# Patient Record
Sex: Female | Born: 1955
Health system: Southern US, Community
[De-identification: ages and names within clinical notes are randomized; demographics above are authoritative.]

## PROBLEM LIST (undated history)

## (undated) DIAGNOSIS — N200 Calculus of kidney: Secondary | ICD-10-CM

## (undated) DIAGNOSIS — I10 Essential (primary) hypertension: Secondary | ICD-10-CM

## (undated) DIAGNOSIS — T7840XA Allergy, unspecified, initial encounter: Secondary | ICD-10-CM

## (undated) DIAGNOSIS — E785 Hyperlipidemia, unspecified: Secondary | ICD-10-CM

## (undated) DIAGNOSIS — N870 Mild cervical dysplasia: Secondary | ICD-10-CM

## (undated) DIAGNOSIS — N83209 Unspecified ovarian cyst, unspecified side: Secondary | ICD-10-CM

## (undated) HISTORY — DX: Mild cervical dysplasia: N87.0

## (undated) HISTORY — PX: PELVIC LAPAROSCOPY: SHX162

## (undated) HISTORY — DX: Unspecified ovarian cyst, unspecified side: N83.209

## (undated) HISTORY — DX: Essential (primary) hypertension: I10

## (undated) HISTORY — DX: Allergy, unspecified, initial encounter: T78.40XA

## (undated) HISTORY — PX: KNEE SURGERY: SHX244

## (undated) HISTORY — PX: JOINT REPLACEMENT: SHX530

## (undated) HISTORY — PX: COLPOSCOPY: SHX161

## (undated) HISTORY — PX: CERVICAL BIOPSY  W/ LOOP ELECTRODE EXCISION: SUR135

## (undated) HISTORY — DX: Hyperlipidemia, unspecified: E78.5

---

## 1990-02-11 HISTORY — PX: LEEP: SHX91

## 1994-02-11 HISTORY — PX: LAPAROSCOPY: SHX197

## 1997-10-26 ENCOUNTER — Other Ambulatory Visit: Admission: RE | Admit: 1997-10-26 | Discharge: 1997-10-26 | Payer: Self-pay | Admitting: Obstetrics and Gynecology

## 1998-11-29 ENCOUNTER — Other Ambulatory Visit: Admission: RE | Admit: 1998-11-29 | Discharge: 1998-11-29 | Payer: Self-pay | Admitting: Obstetrics and Gynecology

## 1999-12-01 ENCOUNTER — Emergency Department (HOSPITAL_COMMUNITY): Admission: EM | Admit: 1999-12-01 | Discharge: 1999-12-01 | Payer: Self-pay | Admitting: Emergency Medicine

## 1999-12-03 ENCOUNTER — Encounter: Payer: Self-pay | Admitting: Endocrinology

## 1999-12-03 ENCOUNTER — Observation Stay (HOSPITAL_COMMUNITY): Admission: EM | Admit: 1999-12-03 | Discharge: 1999-12-04 | Payer: Self-pay | Admitting: Emergency Medicine

## 2000-01-22 ENCOUNTER — Other Ambulatory Visit: Admission: RE | Admit: 2000-01-22 | Discharge: 2000-01-22 | Payer: Self-pay | Admitting: Obstetrics and Gynecology

## 2000-02-01 ENCOUNTER — Other Ambulatory Visit: Admission: RE | Admit: 2000-02-01 | Discharge: 2000-02-01 | Payer: Self-pay | Admitting: Obstetrics and Gynecology

## 2000-02-27 ENCOUNTER — Other Ambulatory Visit: Admission: RE | Admit: 2000-02-27 | Discharge: 2000-02-27 | Payer: Self-pay | Admitting: Obstetrics and Gynecology

## 2001-04-03 ENCOUNTER — Other Ambulatory Visit: Admission: RE | Admit: 2001-04-03 | Discharge: 2001-04-03 | Payer: Self-pay | Admitting: Obstetrics and Gynecology

## 2002-04-26 ENCOUNTER — Other Ambulatory Visit: Admission: RE | Admit: 2002-04-26 | Discharge: 2002-04-26 | Payer: Self-pay | Admitting: Obstetrics and Gynecology

## 2002-06-17 ENCOUNTER — Encounter: Payer: Self-pay | Admitting: Internal Medicine

## 2002-06-23 ENCOUNTER — Encounter: Payer: Self-pay | Admitting: Internal Medicine

## 2003-08-29 ENCOUNTER — Other Ambulatory Visit: Admission: RE | Admit: 2003-08-29 | Discharge: 2003-08-29 | Payer: Self-pay | Admitting: Obstetrics and Gynecology

## 2004-06-18 ENCOUNTER — Ambulatory Visit: Payer: Self-pay | Admitting: Internal Medicine

## 2004-09-04 ENCOUNTER — Other Ambulatory Visit: Admission: RE | Admit: 2004-09-04 | Discharge: 2004-09-04 | Payer: Self-pay | Admitting: Obstetrics and Gynecology

## 2005-12-31 ENCOUNTER — Other Ambulatory Visit: Admission: RE | Admit: 2005-12-31 | Discharge: 2005-12-31 | Payer: Self-pay | Admitting: Obstetrics and Gynecology

## 2006-01-17 ENCOUNTER — Encounter: Payer: Self-pay | Admitting: Internal Medicine

## 2007-03-03 ENCOUNTER — Encounter: Payer: Self-pay | Admitting: Internal Medicine

## 2007-06-10 ENCOUNTER — Encounter: Payer: Self-pay | Admitting: Internal Medicine

## 2007-06-29 ENCOUNTER — Encounter: Payer: Self-pay | Admitting: Gastroenterology

## 2007-06-29 ENCOUNTER — Encounter: Payer: Self-pay | Admitting: Internal Medicine

## 2007-06-29 ENCOUNTER — Other Ambulatory Visit: Admission: RE | Admit: 2007-06-29 | Discharge: 2007-06-29 | Payer: Self-pay | Admitting: Obstetrics and Gynecology

## 2007-07-02 ENCOUNTER — Encounter: Payer: Self-pay | Admitting: Gastroenterology

## 2007-07-23 ENCOUNTER — Ambulatory Visit: Payer: Self-pay | Admitting: Gastroenterology

## 2007-08-07 ENCOUNTER — Ambulatory Visit: Payer: Self-pay | Admitting: Gastroenterology

## 2007-08-07 LAB — HM COLONOSCOPY

## 2007-08-11 ENCOUNTER — Encounter: Payer: Self-pay | Admitting: Gastroenterology

## 2007-10-16 ENCOUNTER — Ambulatory Visit: Payer: Self-pay | Admitting: Obstetrics and Gynecology

## 2008-06-17 ENCOUNTER — Encounter: Payer: Self-pay | Admitting: Internal Medicine

## 2008-08-11 ENCOUNTER — Other Ambulatory Visit: Admission: RE | Admit: 2008-08-11 | Discharge: 2008-08-11 | Payer: Self-pay | Admitting: Obstetrics and Gynecology

## 2008-08-11 ENCOUNTER — Encounter: Payer: Self-pay | Admitting: Obstetrics and Gynecology

## 2008-08-11 ENCOUNTER — Ambulatory Visit: Payer: Self-pay | Admitting: Obstetrics and Gynecology

## 2009-06-11 LAB — HM MAMMOGRAPHY: HM Mammogram: NORMAL

## 2009-06-11 LAB — CONVERTED CEMR LAB: Pap Smear: NORMAL

## 2009-07-06 ENCOUNTER — Ambulatory Visit: Payer: Self-pay | Admitting: Internal Medicine

## 2009-07-12 LAB — CONVERTED CEMR LAB
ALT: 51 units/L — ABNORMAL HIGH (ref 0–35)
AST: 33 units/L (ref 0–37)
Albumin: 4.6 g/dL (ref 3.5–5.2)
Alkaline Phosphatase: 76 units/L (ref 39–117)
BUN: 14 mg/dL (ref 6–23)
Bilirubin, Direct: 0.2 mg/dL (ref 0.0–0.3)
CO2: 31 meq/L (ref 19–32)
Calcium: 9.8 mg/dL (ref 8.4–10.5)
Chloride: 101 meq/L (ref 96–112)
Cholesterol: 107 mg/dL (ref 0–200)
Creatinine, Ser: 0.6 mg/dL (ref 0.4–1.2)
GFR calc non Af Amer: 116.88 mL/min (ref >60)
Glucose, Bld: 80 mg/dL (ref 70–99)
HDL: 54.6 mg/dL (ref >39.00)
LDL Cholesterol: 35 mg/dL (ref 0–99)
Potassium: 3.6 meq/L (ref 3.5–5.1)
Sodium: 142 meq/L (ref 135–145)
TSH: 0.96 microintl units/mL (ref 0.35–5.50)
Total Bilirubin: 0.5 mg/dL (ref 0.3–1.2)
Total CHOL/HDL Ratio: 2
Total Protein: 7.1 g/dL (ref 6.0–8.3)
Triglycerides: 85 mg/dL (ref 0.0–149.0)
VLDL: 17 mg/dL (ref 0.0–40.0)

## 2009-08-04 ENCOUNTER — Ambulatory Visit: Payer: Self-pay | Admitting: Internal Medicine

## 2009-08-04 DIAGNOSIS — R21 Rash and other nonspecific skin eruption: Secondary | ICD-10-CM | POA: Insufficient documentation

## 2009-08-07 ENCOUNTER — Encounter: Payer: Self-pay | Admitting: Internal Medicine

## 2009-08-15 LAB — CONVERTED CEMR LAB
ALT: 86 units/L — ABNORMAL HIGH (ref 0–35)
AST: 46 units/L — ABNORMAL HIGH (ref 0–37)
Albumin: 4.6 g/dL (ref 3.5–5.2)
Alkaline Phosphatase: 87 units/L (ref 39–117)
Bilirubin, Direct: 0.1 mg/dL (ref 0.0–0.3)
Total Bilirubin: 0.6 mg/dL (ref 0.3–1.2)
Total Protein: 6.9 g/dL (ref 6.0–8.3)

## 2009-08-21 ENCOUNTER — Encounter: Payer: Self-pay | Admitting: Internal Medicine

## 2009-08-31 ENCOUNTER — Ambulatory Visit: Payer: Self-pay | Admitting: Obstetrics and Gynecology

## 2009-08-31 ENCOUNTER — Other Ambulatory Visit: Admission: RE | Admit: 2009-08-31 | Discharge: 2009-08-31 | Payer: Self-pay | Admitting: Obstetrics and Gynecology

## 2010-01-22 ENCOUNTER — Ambulatory Visit: Payer: Self-pay | Admitting: Internal Medicine

## 2010-01-22 ENCOUNTER — Encounter: Payer: Self-pay | Admitting: Internal Medicine

## 2010-01-22 LAB — CONVERTED CEMR LAB
ALT: 78 units/L — ABNORMAL HIGH (ref 0–35)
AST: 50 units/L — ABNORMAL HIGH (ref 0–37)
Albumin: 4.3 g/dL (ref 3.5–5.2)
Alkaline Phosphatase: 78 units/L (ref 39–117)
Bilirubin, Direct: 0.2 mg/dL (ref 0.0–0.3)
Cholesterol: 107 mg/dL (ref 0–200)
HCV Ab: NEGATIVE
HDL: 48.8 mg/dL (ref >39.00)
Hep B Core Total Ab: NEGATIVE
LDL Cholesterol: 43 mg/dL (ref 0–99)
Total Bilirubin: 0.7 mg/dL (ref 0.3–1.2)
Total CHOL/HDL Ratio: 2
Total Protein: 6.3 g/dL (ref 6.0–8.3)
Triglycerides: 75 mg/dL (ref 0.0–149.0)
VLDL: 15 mg/dL (ref 0.0–40.0)

## 2010-02-02 ENCOUNTER — Ambulatory Visit: Payer: Self-pay | Admitting: Internal Medicine

## 2010-03-15 NOTE — Procedures (Signed)
Summary: Colonoscopy and Endoscopy Direct Referral Form/[facility]  Colonoscopy and Endoscopy Direct Referral Form/ [facility]   Imported By: Stephannie Li 07/31/2007 09:22:03  _____________________________________________________________________  External Attachment:    Type:   Image     Comment:   External Document

## 2010-03-15 NOTE — Assessment & Plan Note (Signed)
Summary: 6 month rov/njhr   Vital Signs:  Patient profile:   55 year old female Menstrual status:  postmenopausal Weight:      183 pounds Temp:     98.5 degrees F oral Pulse rate:   72 / minute Pulse rhythm:   regular BP sitting:   122 / 82  (left arm) Cuff size:   large  Vitals Entered By: Alfred Levins, CMA (February 02, 2010 9:50 AM) CC: f/u   CC:  f/u.  History of Present Illness: patient comes in for followup. She has a history of hyperlipidemia and elevated LFTs. She has done a great job with weight loss, exercise. She states she is exercising nearly every day.  No other complaints.  Current Medications (verified): 1)  Aspirin 81 Mg  Tabs (Aspirin) .... Take 1 Tablet By Mouth Once A Day 2)  Hydrochlorothiazide 25 Mg  Tabs (Hydrochlorothiazide) .... Take 1 Tablet By Mouth Once A Day 3)  Metoprolol Tartrate 25 Mg Tabs (Metoprolol Tartrate) .... Take 1 Tablet By Mouth Two Times A Day 4)  Crestor 10 Mg Tabs (Rosuvastatin Calcium) .... Take 1 Tablet By Mouth At Bedtime 5)  Vitamin D (Ergocalciferol) 50000 Unit Caps (Ergocalciferol) .... Once Weekly 6)  Losartan Potassium 100 Mg Tabs (Losartan Potassium) .... Take 1 Tablet By Mouth Once A Day 7)  Centrum Cardio  Tabs (Multiple Vitamins-Minerals) .... Once Daily  Allergies (verified): 1)  ! Sulfa 2)  ! Lisinopril  Past History:  Past Medical History: Last updated: 07/06/2009 Hypertension Allergic rhinitis Hyperlipidemia  Past Surgical History: Last updated: 09/25/2006 Right knee arthroscopy Laparoscopy  Family History: Last updated: 02/02/2010 Family History Hypertension mother Family History of CAD Female---mother Family History Diabetes 1st degree relative--mother Siblings: none father deceased  Social History: Last updated: 07/28/2007 Occupation:  accounts receivable Divorced no children  Risk Factors: Smoking Status: quit (08/04/2009) Packs/Day: 0.5 (08/04/2009)  Family History: Family  History Hypertension mother Family History of CAD Female---mother Family History Diabetes 1st degree relative--mother Siblings: none father deceased  Physical Exam  General:  well-developed well-nourished female in no acute distress. HEENT exam atraumatic, normocephalic, neck supple. Cardiac exam S1-S2 are regular.   Impression & Recommendations:  Problem # 1:  HYPERLIPIDEMIA (ICD-272.4)  discussed will stop crestor and recheck in 3 months.  The following medications were removed from the medication list:    Crestor 10 Mg Tabs (Rosuvastatin calcium) .Marland Kitchen... Take 1 tablet by mouth at bedtime  Labs Reviewed: SGOT: 50 (01/22/2010)   SGPT: 78 (01/22/2010)   HDL:48.80 (01/22/2010), 54.60 (07/06/2009)  LDL:43 (01/22/2010), 35 (07/06/2009)  Chol:107 (01/22/2010), 107 (07/06/2009)  Trig:75.0 (01/22/2010), 85.0 (07/06/2009)  Problem # 2:  HYPERTENSION (ICD-401.9) will decrease medications. I think with continued weight loss and needle to discontinue some in the future. Her updated medication list for this problem includes:    Hydrochlorothiazide 25 Mg Tabs (Hydrochlorothiazide) .Marland Kitchen... Take 1 tablet by mouth once a day    Metoprolol Tartrate 25 Mg Tabs (Metoprolol tartrate) .Marland Kitchen... Take 1/2 tablet by mouth two times a day    Losartan Potassium 100 Mg Tabs (Losartan potassium) .Marland Kitchen... Take 1/2 tablet by mouth once a day  Complete Medication List: 1)  Aspirin 81 Mg Tabs (Aspirin) .... Take 1 tablet by mouth once a day 2)  Hydrochlorothiazide 25 Mg Tabs (Hydrochlorothiazide) .... Take 1 tablet by mouth once a day 3)  Metoprolol Tartrate 25 Mg Tabs (Metoprolol tartrate) .... Take 1/2 tablet by mouth two times a day 4)  Vitamin D (  ergocalciferol) 50000 Unit Caps (Ergocalciferol) .... Once weekly 5)  Losartan Potassium 100 Mg Tabs (Losartan potassium) .... Take 1/2 tablet by mouth once a day 6)  Centrum Cardio Tabs (Multiple vitamins-minerals) .... Once daily  Patient Instructions: 1)  Please  schedule a follow-up appointment in 3 months. 2)  lipids 272.4 3)  liver 995.2    Orders Added: 1)  Est. Patient Level III [[address]]

## 2010-03-15 NOTE — Assessment & Plan Note (Signed)
Summary: to be re-est/ok per doc/njr/pt rsc/cjr   Vital Signs:  Patient profile:   55 year old female Menstrual status:  postmenopausal Height:      64 inches Weight:      195 pounds BMI:     33.59 Pulse rate:   64 / minute Pulse rhythm:   regular Resp:     12 per minute BP sitting:   144 / 80  (left arm) Cuff size:   regular  Vitals Entered By: Gladis Riffle, RN (Jul 06, 2009 10:10 AM) CC: to re-establish Is Patient Diabetic? No     Menstrual Status postmenopausal Last PAP Result normal-pt's report   CC:  to re-establish.  History of Present Illness: not seenin 4-5 years---pt's report  HTN---unable to tolerate lisinopril---cough on metoprolol and amlodipine. On amlodipine she has noted progressive ankle swelling (late in day). No SOB, no pnd, orthopnea.  Lipids---tolerating meds, needs followup  Preventive Screening-Counseling & Management  Alcohol-Tobacco     Smoking Status: quit     Year Quit: 2008  Current Medications (verified): 1)  Aspirin 81 Mg  Tabs (Aspirin) .... Take 1 Tablet By Mouth Once A Day 2)  Hydrochlorothiazide 25 Mg  Tabs (Hydrochlorothiazide) .... Take 1 Tablet By Mouth Once A Day 3)  Amlodipine Besylate 10 Mg Tabs (Amlodipine Besylate) .... Take 1 Tablet By Mouth Once A Day 4)  Metoprolol Tartrate 25 Mg Tabs (Metoprolol Tartrate) .... Take 1 Tablet By Mouth Two Times A Day 5)  Crestor 10 Mg Tabs (Rosuvastatin Calcium) .... Take 1 Tablet By Mouth At Bedtime 6)  Vitamin D (Ergocalciferol) 50000 Unit Caps (Ergocalciferol) .... Once Weekly  Allergies: 1)  ! Sulfa 2)  ! Lisinopril  Past History:  Past Medical History: Hypertension Allergic rhinitis Hyperlipidemia  Social History: Smoking Status:  quit  Physical Exam  General:  alert and well-developed.   Head:  normocephalic and atraumatic.   Eyes:  pupils equal and pupils round.   Ears:  R ear normal and L ear normal.   Neck:  No deformities, masses, or tenderness noted. Lungs:   normal respiratory effort and no intercostal retractions.   Heart:  normal rate and regular rhythm.   Abdomen:  Bowel sounds positive,abdomen soft and non-tender without masses, organomegaly or hernias noted. Msk:  No deformity or scoliosis noted of thoracic or lumbar spine.   Neurologic:  cranial nerves II-XII intact and gait normal.   Skin:  turgor normal and color normal.     Impression & Recommendations:  Problem # 1:  HYPERTENSION (ICD-401.9)  edema with amlodipine---change meds side effects discussed BP needs better control anyway monitor BP at home The following medications were removed from the medication list:    Lisinopril 40 Mg Tabs (Lisinopril) .Marland Kitchen... Take 1 tablet by mouth once a day Her updated medication list for this problem includes:    Hydrochlorothiazide 25 Mg Tabs (Hydrochlorothiazide) .Marland Kitchen... Take 1 tablet by mouth once a day    Metoprolol Tartrate 25 Mg Tabs (Metoprolol tartrate) .Marland Kitchen... Take 1 tablet by mouth two times a day    Losartan Potassium 100 Mg Tabs (Losartan potassium) .Marland Kitchen... Take 1 tablet by mouth once a day  BP today: 144/80  Orders: TLB-BMP (Basic Metabolic Panel-BMET) (80048-METABOL)  Problem # 2:  HYPERLIPIDEMIA (ICD-272.4) check labs today Her updated medication list for this problem includes:    Crestor 10 Mg Tabs (Rosuvastatin calcium) .Marland Kitchen... Take 1 tablet by mouth at bedtime  Orders: Venipuncture ([address]) TLB-BMP (Basic Metabolic Panel-BMET) (80048-METABOL) TLB-Lipid  Panel (80061-LIPID) TLB-Hepatic/Liver Function Pnl (80076-HEPATIC) TLB-TSH (Thyroid Stimulating Hormone) (84443-TSH)  Problem # 3:  Preventive Health Care (ICD-V70.0) tdap today reviewed colon she is UTD on PAP, Mammo  Complete Medication List: 1)  Aspirin 81 Mg Tabs (Aspirin) .... Take 1 tablet by mouth once a day 2)  Hydrochlorothiazide 25 Mg Tabs (Hydrochlorothiazide) .... Take 1 tablet by mouth once a day 3)  Metoprolol Tartrate 25 Mg Tabs (Metoprolol tartrate) ....  Take 1 tablet by mouth two times a day 4)  Crestor 10 Mg Tabs (Rosuvastatin calcium) .... Take 1 tablet by mouth at bedtime 5)  Vitamin D (ergocalciferol) 50000 Unit Caps (Ergocalciferol) .... Once weekly 6)  Losartan Potassium 100 Mg Tabs (Losartan potassium) .... Take 1 tablet by mouth once a day  Preventive Care Screening  Colonoscopy:    Next Due:  08/2017  Mammogram:    Date:  06/11/2009    Next Due:  06/2011    Results:  normal-pt's report   Pap Smear:    Date:  06/11/2009    Next Due:  06/2012    Results:  normal-pt's report    Patient Instructions: 1)  Please schedule a follow-up appointment in 1 month. Prescriptions: LOSARTAN POTASSIUM 100 MG TABS (LOSARTAN POTASSIUM) Take 1 tablet by mouth once a day  #30 x 11   Entered and Authorized by:   Birdie Sons MD   Signed by:   Birdie Sons MD on 07/06/2009   Method used:   [facility]       [address]       [place], [place]  [address]       Ph: [phone]       Fax: [phone]   RxID:   [id number]   Appended Document: to be re-est/ok per doc/njr/pt rsc/cjr   Immunizations Administered:  Tetanus Vaccine:    Vaccine Type: Tdap    Site: left deltoid    Mfr: GlaxoSmithKline    Dose: 0.5 ml    Route: IM    Given by: Gladis Riffle, RN    Exp. Date: 05/06/2011    Lot #: [id number]

## 2010-03-15 NOTE — Consult Note (Signed)
Summary: Dr Yolanda Bonine note  Dr Yolanda Bonine note   Imported By: Kassie Mends 07/30/2007 09:06:46  _____________________________________________________________________  External Attachment:    Type:   Image     Comment:   Dr Yolanda Bonine note

## 2010-03-15 NOTE — Consult Note (Signed)
Summary: [facility]  [facility]   Imported By: Maryln Gottron 05/08/2007 15:39:15  _____________________________________________________________________  External Attachment:    Type:   Image     Comment:   External Document

## 2010-03-15 NOTE — Procedures (Signed)
Summary: Colonoscopy   Colonoscopy  Procedure date:  08/07/2007  Findings:      Location:  [facility].   08/2017  Colonoscopy Patient Name: Sheryl, Bates MRN:  Procedure Procedures: Colonoscopy CPT: 47829.    with Hot Biopsy(s)CPT: Z451292.  Personnel: Endoscopist: Barbette Hair. Arlyce Dice, MD.  Exam Location: Outpatient  Patient Consent: Procedure, Alternatives, Risks and Benefits discussed, consent obtained, from patient.  Indications  Evaluation of: Positive fecal occult blood test  History  Current Medications: Patient is not currently taking Coumadin.  Pre-Exam Physical: Performed Aug 07, 2007. Cardio-pulmonary exam, HEENT exam , Abdominal exam, Mental status exam WNL.  Comments: Patient history reviewed/updated, physical performed prior to initiation of sedation?yes Exam Exam: Extent of exam reached: Cecum, extent intended: Cecum.  The cecum was identified by appendiceal orifice and IC valve. Time to Cecum: 00:03: 07. Time for Withdrawl: 00:09:05. Colon retroflexion performed. ASA Classification: I. Tolerance: good.  Monitoring: Pulse and BP monitoring, Oximetry used. Supplemental O2 given. at 2 Liters.  Colon Prep Used Miralax for colon prep. Prep results: good.  Sedation Meds: Patient assessed and found to be appropriate for moderate (conscious) sedation. Sedation was managed by the Endoscopist. Fentanyl 50 mcg. given IV. Versed 5 mg. given IV.  Findings - DIVERTICULOSIS: Ascending Colon to Transverse Colon. ICD9: Diverticulosis: 562.10. Comments: Few diverticula.  POLYP: Descending Colon, Maximum size: 3 mm. Procedure:  hot biopsy, ICD9: Colon Polyps: 211.3. Comments: Nonbleeding polyp.  - DIVERTICULOSIS: Descending Colon to Sigmoid Colon. Comments: Scattered diverticula.  NORMAL EXAM: Cecum.  - NORMAL EXAM: Sigmoid Colon to Rectum.   Assessment Abnormal examination, see findings above.  Diagnoses: 562.10: Diverticulosis.  211.3: Colon  Polyps.   Events  Unplanned Interventions: No intervention was required.  Unplanned Events: There were no complications. Plans  Post Exam Instructions: Post sedation instructions given.  Patient Education: Patient given standard instructions for: Polyps. Diverticulosis.  Disposition: After procedure patient sent to recovery. After recovery patient sent home.  Scheduling/Referral: Home stool hemocults, around Aug 14, 2007.  Colonoscopy, to Barbette Hair. Arlyce Dice, MD, if adenoma, around Aug 06, 2012.    cc.   Daniel L. Gottsegen,MD     REPORT OF SURGICAL PATHOLOGY   Case #: [id number] Patient Name: Sheryl Bates, Sheryl Bates. Office Chart Number:  [id number]   MRN: [id number] Pathologist: Alden Server A. Delila Spence, MD DOB/Age  [birth year] (Age: 55)    Gender: F Date Taken:  08/04/2007 Date Received: 08/07/2007   FINAL DIAGNOSIS   ***MICROSCOPIC EXAMINATION AND DIAGNOSIS***   COLON, DESCENDING, POLYPS:   - INFLAMED HYPERPLASTIC POLYP. - NO ADENOMATOUS CHANGE, HIGH GRADE DYSPLASIA, OR MALIGNANCY IDENTIFIED.    gdt Date Reported:  08/10/2007     Alden Server A. Delila Spence, MD *** Electronically Signed Out By EAA ***   Clinical information Heme + stool R/O adenoma (jes)    specimen(s) obtained Colon, polyp(s), descending   Gross Description Received in formalin is a tan, soft tissue fragment that is submitted in toto.  Size:  0.4 cm One block  (ML:jes,08/10/07)    jes/     Signed by Louis Meckel MD on 08/11/2007 at 2:59 PM  ________________________________________________________________________ colo 10 yrs   Signed by Louis Meckel MD on 08/11/2007 at 2:59 PM    August 11, 2007 MRN: [id number]    [name] [address] [place], [place]  [address]    Dear [name],  I am pleased to inform you that the colon polyp(s) removed during your recent colonoscopy was (were) found to  be benign (no cancer detected) upon pathologic examination.  I recommend you have a repeat  colonoscopy examination in 10_ years to look for recurrent polyps, as having colon polyps increases your risk for having recurrent polyps or even colon cancer in the future.  Should you develop new or worsening symptoms of abdominal pain, bowel habit changes or bleeding from the rectum or bowels, please schedule an evaluation with either your primary care physician or with me.  Additional information/recommendations:  __ No further action with gastroenterology is needed at this time. Please      follow-up with your primary care physician for your other healthcare      needs.  __ Please call [phone] to schedule a return visit to review your      situation.  __ Please keep your follow-up visit as already scheduled.  _x_ Continue treatment plan as outlined the day of your exam.  Please call us if you are having persistent problems or have questions about your condition that have not been fully answered at this time.  Sincerely,  Louis Meckel MD  This letter has been electronically signed by your physician.   Signed by Louis Meckel MD on 08/11/2007 at 3:00 PM  ________________________________________________________________________   ________________________________________________________________________

## 2010-03-15 NOTE — Letter (Signed)
Summary: [facility]  [facility]   Imported By: Maryln Gottron 07/14/2008 08:36:18  _____________________________________________________________________  External Attachment:    Type:   Image     Comment:   External Document

## 2010-03-15 NOTE — Miscellaneous (Signed)
Summary: RX MEDS  Clinical Lists Changes  Medications: Added new medication of DULCOLAX 5 MG  TBEC (BISACODYL) Day before procedure take 2 at 3pm and 2 at 8pm. - Signed Added new medication of METOCLOPRAMIDE HCL 10 MG  TABS (METOCLOPRAMIDE HCL) As per prep instructions. - Signed Added new medication of MIRALAX   POWD (POLYETHYLENE GLYCOL 3350) As per prep  instructions. - Signed Rx of DULCOLAX 5 MG  TBEC (BISACODYL) Day before procedure take 2 at 3pm and 2 at 8pm.;  #4 x 0;  Signed;  Entered by: Burman Foster RN;  Authorized by: Louis Meckel MD;  Method used: Electronic Rx of METOCLOPRAMIDE HCL 10 MG  TABS (METOCLOPRAMIDE HCL) As per prep instructions.;  #2 x 0;  Signed;  Entered by: Burman Foster RN;  Authorized by: Louis Meckel MD;  Method used: Electronic Rx of MIRALAX   POWD (POLYETHYLENE GLYCOL 3350) As per prep  instructions.;  #255gm x 0;  Signed;  Entered by: Burman Foster RN;  Authorized by: Louis Meckel MD;  Method used: Electronic    Prescriptions: MIRALAX   POWD (POLYETHYLENE GLYCOL 3350) As per prep  instructions.  #255gm x 0   Entered by:   Burman Foster RN   Authorized by:   Louis Meckel MD   Signed by:   Burman Foster RN on 07/23/2007   Method used:   Electronically sent to ...       [facility]       [address]       [place], [place]  [address]       Ph: [phone]       Fax: [phone]   RxID:   [id number] METOCLOPRAMIDE HCL 10 MG  TABS (METOCLOPRAMIDE HCL) As per prep instructions.  #2 x 0   Entered by:   Burman Foster RN   Authorized by:   Louis Meckel MD   Signed by:   Burman Foster RN on 07/23/2007   Method used:   Electronically sent to ...       [facility]       [address]       [place], [place]  [address]       Ph: [phone]       Fax: [phone]   RxID:   [id number] DULCOLAX 5 MG  TBEC (BISACODYL) Day before procedure take 2 at  3pm and 2 at 8pm.  #4 x 0   Entered by:   Burman Foster RN   Authorized by:   Louis Meckel MD   Signed by:   Burman Foster RN on 07/23/2007   Method used:   Electronically sent to ...       [facility]       [address]       [place], [place]  [address]       Ph: [phone]       Fax: [phone]   RxID:   [id number]

## 2010-03-15 NOTE — Assessment & Plan Note (Signed)
Summary: 1 MONTH ROV/NJR--check LFTs 995.2/et   Vital Signs:  Patient profile:   55 year old female Menstrual status:  postmenopausal Weight:      196 pounds BMI:     33.76 Temp:     98.6 degrees F oral Pulse rate:   60 / minute Pulse rhythm:   regular Resp:     12 per minute BP sitting:   142 / 80  (left arm) Cuff size:   regular  Vitals Entered By: Gladis Riffle, RN (August 04, 2009 11:49 AM) CC: 1 month rov, to have LFTs done today--c/o poison ivy behind ears, up into scalp, and all across neck Is Patient Diabetic? No   CC:  1 month rov, to have LFTs done today--c/o poison ivy behind ears, up into scalp, and and all across neck.  History of Present Illness: rash for 7 days worked in yard previously pruritic  here for f/u elevated LFTs no jaundice no abdominal pain  hyperlipidemia note crestor  All other systems reviewed and were negative   Preventive Screening-Counseling & Management  Alcohol-Tobacco     Smoking Status: quit     Packs/Day: 0.5     Year Quit: 2008  Current Medications (verified): 1)  Aspirin 81 Mg  Tabs (Aspirin) .... Take 1 Tablet By Mouth Once A Day 2)  Hydrochlorothiazide 25 Mg  Tabs (Hydrochlorothiazide) .... Take 1 Tablet By Mouth Once A Day 3)  Metoprolol Tartrate 25 Mg Tabs (Metoprolol Tartrate) .... Take 1 Tablet By Mouth Two Times A Day 4)  Crestor 10 Mg Tabs (Rosuvastatin Calcium) .... Take 1 Tablet By Mouth At Bedtime 5)  Vitamin D (Ergocalciferol) 50000 Unit Caps (Ergocalciferol) .... Once Weekly 6)  Losartan Potassium 100 Mg Tabs (Losartan Potassium) .... Take 1 Tablet By Mouth Once A Day 7)  Centrum Cardio  Tabs (Multiple Vitamins-Minerals) .... Once Daily  Allergies: 1)  ! Sulfa 2)  ! Lisinopril  Social History: Packs/Day:  0.5  Physical Exam  General:  alert and well-developed.   Head:  Normocephalic and atraumatic without obvious abnormalities. No apparent alopecia or balding. Ears:  R ear normal and L ear normal.   Neck:   No deformities, masses, or tenderness noted. Heart:  normal rate and regular rhythm.   Skin:  raised rash on arms, neck and trunk Cervical Nodes:  No lymphadenopathy noted Axillary Nodes:  No palpable lymphadenopathy Inguinal Nodes:  No significant adenopathy   Impression & Recommendations:  Problem # 1:  RASH-NONVESICULAR (ICD-782.1)  contact dermatitis steroind injection   Orders: Depo- Medrol 80mg  (J1040) Admin of Therapeutic Inj (IM or Frost) ([address]) Admin of Therapeutic Inj  intramuscular or subcutaneous ([address])  Problem # 2:  HYPERTENSION (ICD-401.9) edema resolved off amlodipine bp ok Her updated medication list for this problem includes:    Hydrochlorothiazide 25 Mg Tabs (Hydrochlorothiazide) .Marland Kitchen... Take 1 tablet by mouth once a day    Metoprolol Tartrate 25 Mg Tabs (Metoprolol tartrate) .Marland Kitchen... Take 1 tablet by mouth two times a day    Losartan Potassium 100 Mg Tabs (Losartan potassium) .Marland Kitchen... Take 1 tablet by mouth once a day  BP today: 142/80 Prior BP: 144/80 (07/06/2009)  Labs Reviewed: K+: 3.6 (07/06/2009) Creat: : 0.6 (07/06/2009)   Chol: 107 (07/06/2009)   HDL: 54.60 (07/06/2009)   LDL: 35 (07/06/2009)   TG: 85.0 (07/06/2009)  Problem # 3:  HYPERLIPIDEMIA (ICD-272.4)  Her updated medication list for this problem includes:    Crestor 10 Mg Tabs (Rosuvastatin calcium) .Marland Kitchen... Take 1  tablet by mouth at bedtime  Labs Reviewed: SGOT: 33 (07/06/2009)   SGPT: 51 (07/06/2009)   HDL:54.60 (07/06/2009)  LDL:35 (07/06/2009)  Chol:107 (07/06/2009)  Trig:85.0 (07/06/2009) carotid ultrasound report from [facility]  Complete Medication List: 1)  Aspirin 81 Mg Tabs (Aspirin) .... Take 1 tablet by mouth once a day 2)  Hydrochlorothiazide 25 Mg Tabs (Hydrochlorothiazide) .... Take 1 tablet by mouth once a day 3)  Metoprolol Tartrate 25 Mg Tabs (Metoprolol tartrate) .... Take 1 tablet by mouth two times a day 4)  Crestor 10 Mg Tabs (Rosuvastatin calcium) .... Take 1 tablet by  mouth at bedtime 5)  Vitamin D (ergocalciferol) 50000 Unit Caps (Ergocalciferol) .... Once weekly 6)  Losartan Potassium 100 Mg Tabs (Losartan potassium) .... Take 1 tablet by mouth once a day 7)  Centrum Cardio Tabs (Multiple vitamins-minerals) .... Once daily  Other Orders: Venipuncture (57846) TLB-Hepatic/Liver Function Pnl (80076-HEPATIC)  Patient Instructions: 1)  Please schedule a follow-up appointment in 6 months. 2)  lipids 272.4 3)  liver 995.2.    Medication Administration  Injection # 1:    Medication: Depo- Medrol 80mg     Diagnosis: RASH-NONVESICULAR (ICD-782.1)    Route: IM    Site: LUOQ gluteus    Exp Date: 12/13/2011    Lot #: [id number]    Mfr: pfizer    Patient tolerated injection without complications    Given by: Gladis Riffle, RN (August 04, 2009 12:25 PM)  Orders Added: 1)  Est. Patient Level IV [28413] 2)  Depo- Medrol 80mg  [J1040] 3)  Admin of Therapeutic Inj (IM or Kaka) [96372] 4)  Admin of Therapeutic Inj  intramuscular or subcutaneous [96372] 5)  Venipuncture [36415] 6)  TLB-Hepatic/Liver Function Pnl [80076-HEPATIC]

## 2010-03-15 NOTE — Letter (Signed)
**Note De-Identified Sanora Cunanan Obfuscation** Summary: Patient Notice- Polyp Results  [facility]  [address] [place], [place] [address]   Phone: [phone]  Fax: [phone]        August 11, 2007 MRN: [id number]    Naysa Adcox [address] [place], [place]  [address]    Dear Ms. Betzler,  I am pleased to inform you that the colon polyp(s) removed during your recent colonoscopy was (were) found to be benign (no cancer detected) upon pathologic examination.  I recommend you have a repeat colonoscopy examination in 10_ years to look for recurrent polyps, as having colon polyps increases your risk for having recurrent polyps or even colon cancer in the future.  Should you develop new or worsening symptoms of abdominal pain, bowel habit changes or bleeding from the rectum or bowels, please schedule an evaluation with either your primary care physician or with me.  Additional information/recommendations:  __ No further action with gastroenterology is needed at this time. Please      follow-up with your primary care physician for your other healthcare      needs.  __ Please call [phone] to schedule a return visit to review your      situation.  __ Please keep your follow-up visit as already scheduled.  _x_ Continue treatment plan as outlined the day of your exam.  Please call us if you are having persistent problems or have questions about your condition that have not been fully answered at this time.  Sincerely,  Louis Meckel MD  This letter has been electronically signed by your physician.

## 2010-03-15 NOTE — Consult Note (Signed)
Summary: [facility]   Imported By: Stephannie Li 07/31/2007 09:23:50  _____________________________________________________________________  External Attachment:    Type:   Image     Comment:   External Document

## 2010-04-20 ENCOUNTER — Other Ambulatory Visit: Payer: Self-pay

## 2010-04-23 ENCOUNTER — Other Ambulatory Visit (INDEPENDENT_AMBULATORY_CARE_PROVIDER_SITE_OTHER): Payer: Self-pay

## 2010-04-23 DIAGNOSIS — T887XXA Unspecified adverse effect of drug or medicament, initial encounter: Secondary | ICD-10-CM

## 2010-04-23 DIAGNOSIS — E785 Hyperlipidemia, unspecified: Secondary | ICD-10-CM

## 2010-04-23 LAB — LIPID PANEL
Cholesterol: 169 mg/dL (ref 0–200)
HDL: 53.2 mg/dL (ref >39.00)
LDL Cholesterol: 95 mg/dL (ref 0–99)
Total CHOL/HDL Ratio: 3
Triglycerides: 102 mg/dL (ref 0.0–149.0)
VLDL: 20.4 mg/dL (ref 0.0–40.0)

## 2010-04-23 LAB — HEPATIC FUNCTION PANEL
ALT: 53 U/L — ABNORMAL HIGH (ref 0–35)
AST: 39 U/L — ABNORMAL HIGH (ref 0–37)
Albumin: 4.2 g/dL (ref 3.5–5.2)
Alkaline Phosphatase: 71 U/L (ref 39–117)
Bilirubin, Direct: 0.2 mg/dL (ref 0.0–0.3)
Total Bilirubin: 0.7 mg/dL (ref 0.3–1.2)
Total Protein: 6.3 g/dL (ref 6.0–8.3)

## 2010-05-01 ENCOUNTER — Encounter: Payer: Self-pay | Admitting: Internal Medicine

## 2010-05-04 ENCOUNTER — Ambulatory Visit (INDEPENDENT_AMBULATORY_CARE_PROVIDER_SITE_OTHER): Payer: BC Managed Care – PPO | Admitting: Internal Medicine

## 2010-05-04 ENCOUNTER — Encounter: Payer: Self-pay | Admitting: Internal Medicine

## 2010-05-04 DIAGNOSIS — I10 Essential (primary) hypertension: Secondary | ICD-10-CM

## 2010-05-04 DIAGNOSIS — E785 Hyperlipidemia, unspecified: Secondary | ICD-10-CM

## 2010-05-04 MED ORDER — LOSARTAN POTASSIUM 50 MG PO TABS: 50.0000 mg | ORAL_TABLET | Freq: Every day | ORAL | Status: DC

## 2010-05-04 NOTE — Progress Notes (Signed)
  Subjective:    Patient ID: Sheryl Bates, female    DOB: 1955/10/04, 55 y.o.   MRN: [id number]  HPI  F/u lfts F/u lipids  BP---back on 50 mg metoprolol and currently taking losartan 50 mg   Past Medical History  Diagnosis Date  . Hypertension   . Allergy   . Hyperlipidemia    Past Surgical History  Procedure Date  . Knee arthroscopy     right  . Laparoscopy     reports that she quit smoking about 5 years ago. She does not have any smokeless tobacco history on file. She reports that she drinks alcohol. She reports that she does not use illicit drugs. family history includes Diabetes in her mother; Heart disease in her mother; and Hypertension in her mother. Allergies  Allergen Reactions  . Lisinopril     REACTION: cough  . Sulfonamide Derivatives      Review of Systems  patient denies chest pain, shortness of breath, orthopnea. Denies lower extremity edema, abdominal pain, change in appetite, change in bowel movements. Patient denies rashes, musculoskeletal complaints. No other specific complaints in a complete review of systems.      Objective:   Physical Exam nad Pulses normal CV-reg rate no gallop Chest- no increased WOB ABD-soft, NT Ext, no edema       Assessment & Plan:  No need for statin Continue bp meds lfts improved---weight loss

## 2010-05-07 ENCOUNTER — Encounter: Payer: Self-pay | Admitting: Internal Medicine

## 2010-05-07 NOTE — Assessment & Plan Note (Signed)
Adequate control. Continue current medications.  Reviewed medication list with patient. Printed on after visit summary  

## 2010-05-07 NOTE — Assessment & Plan Note (Signed)
Adequate control. Continue current medications.  Reviewed medication list with patient. Printed on after visit summary

## 2010-05-25 ENCOUNTER — Other Ambulatory Visit: Payer: Self-pay | Admitting: *Deleted

## 2010-05-25 DIAGNOSIS — I10 Essential (primary) hypertension: Secondary | ICD-10-CM

## 2010-05-25 MED ORDER — HYDROCHLOROTHIAZIDE 25 MG PO TABS: 25.0000 mg | ORAL_TABLET | Freq: Every day | ORAL | Status: DC

## 2010-06-29 NOTE — Discharge Summary (Signed)
[place]  Patient:    Rolly Salter                  MRN: [id number] Adm. Date:  [id number] Disc. Date: [id number] Attending:  Justine Null Dictator:   Cornell Barman, P.A.                           Discharge Summary  DISCHARGE DIAGNOSES: 1. Intractable nausea and vomiting. 2. Gastritis. 3. Hypertension.  BRIEF ADMISSION HISTORY:  [name] is a 55 year old white female who presented with three days of intractable nausea and vomiting.  She denied any diarrhea.  She has been vomiting vile, yellow fluid.  She had one episode of fever and chills on Saturday, prompting an emergency department evaluation at [facility].  Her work-up there was unremarkable.  She was discharged home with antiemetics and suppositories.  Unfortunately, she did not fill the prescription.  She presented on the day of admission with continued nausea, vomiting, abdominal pain, and anorexia.  LABORATORIES ON ADMISSION:  The amylase was 46 and the lipase was 27.  The CBC and CMET were normal.  The urinalysis was negative.  The urine pregnancy test was negative.  HOSPITAL COURSE: #1 - GASTROINTESTINAL:  The patient was admitted with intractable nausea and vomiting most likely gastritis.  An abdominal ultrasound was obtained and was normal with no evidence of gallstones.  After IV fluids and antiemetics, the patient is now able to tolerate clear liquids and feels she is ready for discharge home.  #2 - HYPERTENSION:  The patients blood pressure was poorly controlled.  On admission, her blood pressure was 206/112.  The patient had systolic blood pressures that ranged from 157-199.  The patient is not on any antihypertensives, but we will start her on some Norvasc at discharge.  MEDICATIONS AT DISCHARGE: 1. Norvasc 5 mg q.d. 2. Phenergan 25 mg suppository q.4-6h. as needed.  FOLLOW-UP:  The patient is to follow up with Bruce H. Swords, M.D., in  the next one to two weeks for a blood pressure check. DD:  12/04/99 TD:  12/04/99 Job: [id number]

## 2010-06-29 NOTE — H&P (Signed)
[place]. [facility]  Patient:    Sheryl Bates                  MRN: [id number] Adm. Date:  [id number] Attending:  Sela Hua Dictator:   Cornell Barman, P.A. CC:         Valetta Mole. Swords, M.D. [facility]   History and Physical  HISTORY OF PRESENT ILLNESS:  Ms. Sheryl Bates is a 55 year old white female who presents with intractable nausea and vomiting since Saturday.  The patient was evaluated in the [facility] emergency department on Saturday evening.  The patient had had a temperature of 101 with chills.  Her workup at [facility] was unremarkable except for a white count of 10.9 and 87% neutrophils.  Amylase and lipase were normal.  The patient was discharged home with a prescription for an antiemetic which she did not fill.  The patient returns today again with intractable nausea and vomiting.  She describes 60 episodes of emesis since Saturday evening.  This is bile and yellow-colored in nature.  The patient denies any more fevers or chills since Saturday.  Her last bowel movement was Saturday and it was a small amount.  The patient has been essentially NPO since Saturday as well. The patient denies any diarrhea or loose stools.  She denies any dysuria. She denies any exposure to illness or travel.  She states that she ate some chicken nuggets at [employer] on Friday around 2:30 p.m. and developed nausea that evening.  The patient in the emergency department is feeling better after receiving Compazine and IV fluids.  PAST MEDICAL HISTORY:  Negative except for arthroscopic right knee surgery and laparoscopy for endometriosis.  She denies hypertension, diabetes, thyroid disease, asthma, emphysema, peptic ulcer disease, reflux.  SOCIAL HISTORY:  She is divorced and has no children.  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  She does smoke 1/2 to 1 pack per day for about 25 years.  She occasionally drinks  alcohol.  FAMILY HISTORY:  Mother living at 80 with diabetes.  Father living at age 56. This patient is an only child.  REVIEW OF SYSTEMS:  Negative for weight change.  She denies any dyspnea, chest pain, dysuria, melena, bright red blood per rectum, coffeeground emesis, hematemesis, history of urinary tract infection, nephrolithiasis, gallstones.  PHYSICAL EXAMINATION:  VITAL SIGNS:  The patient was afebrile.  Blood pressure 206/112, repeat blood pressure 143/55, respirations 18, heart rate 60.  GENERAL:  This is an obese, middle-aged white female who is groggy from Compazine.  HEENT:  Atraumatic and normocephalic.  Pupils are equally round and reactive to light and accommodation.  Oropharynx is clear.  Mucous membranes are moist.  NECK:  Without carotid bruit, adenopathy, or thyromegaly.  LUNGS:  Clear without wheezes, rales, or rhonchi.  HEART:  Regular without murmurs, rubs, or gallops.  ABDOMEN:  Bowel sounds present, soft and nontender.  NO hepatosplenomegaly. No rebound and no guarding.  No CVA tenderness.  EXTREMITIES:  Without edema.  NEUROLOGICAL:  Without any focal deficits.  LABORATORY DATA:  Amylase is 46, lipase 27.  CMET, CBC with differential were normal.  UA was negative.  Urine pregnancy was negative.  IMPRESSION: 1. Gastrointestinal intractable nausea and vomiting, possibly viral gastritis    with an unremarkable examination. 2. Hypertension, currently controlled, no previous history of hypertension.  PLAN:  We discussed a 24-hour admission with the patient for IV fluids and antiemetics.  At  this time, she prefers a prescription for an antiemetic suppository and being able to return home.  This can be done and we can arrange for the patient to follow up with Dr. Cato Mulligan later this week.  The patients blood pressure probably should be monitored a little more closely. DD:  12/03/99 TD:  12/03/99 Job: [id number]

## 2010-09-04 ENCOUNTER — Encounter: Payer: Self-pay | Admitting: Internal Medicine

## 2010-09-19 ENCOUNTER — Encounter: Payer: BC Managed Care – PPO | Admitting: Obstetrics and Gynecology

## 2010-10-11 ENCOUNTER — Encounter: Payer: Self-pay | Admitting: Obstetrics and Gynecology

## 2010-10-11 ENCOUNTER — Ambulatory Visit (INDEPENDENT_AMBULATORY_CARE_PROVIDER_SITE_OTHER): Payer: BC Managed Care – PPO | Admitting: Obstetrics and Gynecology

## 2010-10-11 ENCOUNTER — Other Ambulatory Visit (HOSPITAL_COMMUNITY)
Admission: RE | Admit: 2010-10-11 | Discharge: 2010-10-11 | Disposition: A | Discharge status: Home / Self Care | Payer: BC Managed Care – PPO | Source: Ambulatory Visit | Attending: Obstetrics and Gynecology | Admitting: Obstetrics and Gynecology

## 2010-10-11 VITALS — BP 134/84 | Ht 64.0 in | Wt 192.0 lb

## 2010-10-11 DIAGNOSIS — E559 Vitamin D deficiency, unspecified: Secondary | ICD-10-CM

## 2010-10-11 DIAGNOSIS — R82998 Other abnormal findings in urine: Secondary | ICD-10-CM

## 2010-10-11 DIAGNOSIS — Z01419 Encounter for gynecological examination (general) (routine) without abnormal findings: Secondary | ICD-10-CM | POA: Insufficient documentation

## 2010-10-11 NOTE — Progress Notes (Signed)
The patient came to see me today for an annual GYN exam. She is doing well. She whenever 50,000 IUs D. And is now taking 1200 a day. She is up-to-date on mammograms and bone densities. She has occasional menopausal symptoms but is fine without HRT. She is having no pelvic pain or vaginal bleeding.  Physical examination: HEENT within normal limits. Neck: Thyroid not large. No masses. Supraclavicular nodes: not enlarged. Breasts: Examined in both sitting midline position. No skin changes and no masses. Abdomen: Soft no guarding rebound or masses or hernia. Pelvic: External: Within normal limits. BUS: Within normal limits. Vaginal:within normal limits. Good estrogen effect. No evidence of cystocele rectocele or enterocele. Cervix: clean. Uterus: Normal size and shape. Adnexa: No masses. Rectovaginal exam: Confirmatory and negative. Extremities: Within normal limits.  Assessment: 1. Mild menopausal symptoms 2. Vitamin D deficiency  Plan: Vitamin D level checked. We will notify her if she needs to change her vitamin D. Continue yearly mammograms.

## 2010-10-12 ENCOUNTER — Telehealth: Payer: Self-pay

## 2010-10-12 LAB — VITAMIN D 25 HYDROXY (VIT D DEFICIENCY, FRACTURES): Vit D, 25-Hydroxy: 31 ng/mL (ref 30–89)

## 2010-10-16 NOTE — Telephone Encounter (Signed)
PT. NOTIFIED BY CELL # VOICEMAIL, PER DR. G. VIT. D LEVEL IN LOW NORMAL RANGE AT 31, & TO INCREASE OTC VIT. D TO 2,000 IU'S Q.D.

## 2010-10-26 ENCOUNTER — Telehealth: Payer: Self-pay | Admitting: Internal Medicine

## 2010-10-26 ENCOUNTER — Other Ambulatory Visit: Payer: BC Managed Care – PPO

## 2010-10-26 NOTE — Telephone Encounter (Signed)
Just found out that her mother just passed, Sheryl Bates. Patient is coming in from a [place] trip and will need something to calm her nerves. Please call [facility]. Thanks. Her cell number is [phone]. Thanks.

## 2010-10-29 MED ORDER — ALPRAZOLAM 0.5 MG PO TABS: 0.5000 mg | ORAL_TABLET | Freq: Three times a day (TID) | ORAL | Status: AC | PRN

## 2010-10-29 NOTE — Telephone Encounter (Signed)
rx called in, pt aware 

## 2010-10-29 NOTE — Telephone Encounter (Signed)
Alprazolam 0.5 mg po bid prn anxiety #20/0 refills

## 2010-11-02 ENCOUNTER — Encounter: Payer: BC Managed Care – PPO | Admitting: Internal Medicine

## 2010-11-23 ENCOUNTER — Other Ambulatory Visit: Payer: BC Managed Care – PPO

## 2010-11-30 ENCOUNTER — Encounter: Payer: Self-pay | Admitting: Internal Medicine

## 2010-11-30 ENCOUNTER — Ambulatory Visit (INDEPENDENT_AMBULATORY_CARE_PROVIDER_SITE_OTHER): Payer: BC Managed Care – PPO | Admitting: Internal Medicine

## 2010-11-30 VITALS — BP 136/84 | HR 68 | Temp 98.4°F | Ht 63.5 in | Wt 191.0 lb

## 2010-11-30 DIAGNOSIS — I10 Essential (primary) hypertension: Secondary | ICD-10-CM

## 2010-11-30 DIAGNOSIS — Z0000 Encounter for general adult medical examination without abnormal findings: Secondary | ICD-10-CM

## 2010-11-30 DIAGNOSIS — E785 Hyperlipidemia, unspecified: Secondary | ICD-10-CM

## 2010-11-30 DIAGNOSIS — R079 Chest pain, unspecified: Secondary | ICD-10-CM

## 2010-11-30 LAB — HEPATIC FUNCTION PANEL
ALT: 93 U/L — ABNORMAL HIGH (ref 0–35)
AST: 45 U/L — ABNORMAL HIGH (ref 0–37)
Albumin: 4.6 g/dL (ref 3.5–5.2)
Alkaline Phosphatase: 64 U/L (ref 39–117)
Bilirubin, Direct: 0.1 mg/dL (ref 0.0–0.3)
Total Bilirubin: 0.5 mg/dL (ref 0.3–1.2)
Total Protein: 7.1 g/dL (ref 6.0–8.3)

## 2010-11-30 LAB — BASIC METABOLIC PANEL WITH GFR
BUN: 15 mg/dL (ref 6–23)
CO2: 29 meq/L (ref 19–32)
Calcium: 9.4 mg/dL (ref 8.4–10.5)
Chloride: 103 meq/L (ref 96–112)
Creatinine, Ser: 0.7 mg/dL (ref 0.4–1.2)
GFR: 98.19 mL/min
Glucose, Bld: 87 mg/dL (ref 70–99)
Potassium: 3.6 meq/L (ref 3.5–5.1)
Sodium: 143 meq/L (ref 135–145)

## 2010-11-30 LAB — CBC WITH DIFFERENTIAL/PLATELET
Basophils Absolute: 0 10*3/uL (ref 0.0–0.1)
Basophils Relative: 0.5 % (ref 0.0–3.0)
Eosinophils Absolute: 0 10*3/uL (ref 0.0–0.7)
Eosinophils Relative: 0.7 % (ref 0.0–5.0)
HCT: 44.8 % (ref 36.0–46.0)
Hemoglobin: 14.9 g/dL (ref 12.0–15.0)
Lymphocytes Relative: 34.7 % (ref 12.0–46.0)
Lymphs Abs: 1.9 10*3/uL (ref 0.7–4.0)
MCHC: 33.2 g/dL (ref 30.0–36.0)
MCV: 94.9 fl (ref 78.0–100.0)
Monocytes Absolute: 0.4 10*3/uL (ref 0.1–1.0)
Monocytes Relative: 6.9 % (ref 3.0–12.0)
Neutro Abs: 3.2 10*3/uL (ref 1.4–7.7)
Neutrophils Relative %: 57.2 % (ref 43.0–77.0)
Platelets: 204 10*3/uL (ref 150.0–400.0)
RBC: 4.72 Mil/uL (ref 3.87–5.11)
RDW: 12.9 % (ref 11.5–14.6)
WBC: 5.5 10*3/uL (ref 4.5–10.5)

## 2010-11-30 LAB — POCT URINALYSIS DIPSTICK
Bilirubin, UA: NEGATIVE
Blood, UA: NEGATIVE
Glucose, UA: NEGATIVE
Ketones, UA: NEGATIVE
Nitrite, UA: NEGATIVE
Spec Grav, UA: 1.025
Urobilinogen, UA: 0.2
pH, UA: 6.5

## 2010-11-30 LAB — TSH: TSH: 0.85 u[IU]/mL (ref 0.35–5.50)

## 2010-11-30 LAB — LIPID PANEL
Cholesterol: 195 mg/dL (ref 0–200)
HDL: 64.6 mg/dL (ref >39.00)
LDL Cholesterol: 108 mg/dL — ABNORMAL HIGH (ref 0–99)
Total CHOL/HDL Ratio: 3
Triglycerides: 114 mg/dL (ref 0.0–149.0)
VLDL: 22.8 mg/dL (ref 0.0–40.0)

## 2010-11-30 NOTE — Progress Notes (Signed)
  Subjective:    Patient ID: Sheryl Bates, female    DOB: May 16, 1955, 55 y.o.   MRN: [id number]  HPI  cpx  Last week had an episode of left sided chest pain---has had nonexertional episodes since that time.   Past Medical History  Diagnosis Date  . Hypertension   . Allergy   . Hyperlipidemia   . Endometriosis   . Ovarian cyst     right  . CIN I (cervical intraepithelial neoplasia I)   . Vitamin D deficiency    Past Surgical History  Procedure Date  . Knee surgery     Right  . Laparoscopy 1996  . Leep 1992  . Cervical biopsy  w/ loop electrode excision   . Pelvic laparoscopy     reports that she quit smoking about 5 years ago. She has never used smokeless tobacco. She reports that she drinks alcohol. She reports that she does not use illicit drugs. family history includes Diabetes in her mother; Heart disease in her father and mother; and Hypertension in her mother. mother deceased with MI Allergies  Allergen Reactions  . Lisinopril     REACTION: cough  . Sulfonamide Derivatives    Review of Systems  patient denies chest pain, shortness of breath, orthopnea. Denies lower extremity edema, abdominal pain, change in appetite, change in bowel movements. Patient denies rashes, musculoskeletal complaints. No other specific complaints in a complete review of systems.      Objective:   Physical Exam  Well-developed well-nourished female in no acute distress. HEENT exam atraumatic, normocephalic, extraocular muscles are intact. Neck is supple. No jugular venous distention no thyromegaly. Chest clear to auscultation without increased work of breathing. Cardiac exam S1 and S2 are regular. Abdominal exam active bowel sounds, soft, nontender. Extremities no edema. Neurologic exam she is alert without any motor sensory deficits. Gait is normal.     Assessment & Plan:  cpx---well visit  Episode of CP---EKG and stress test (will be scheduled)

## 2010-11-30 NOTE — Assessment & Plan Note (Signed)
BP Readings from Last 3 Encounters:  11/30/10 136/84  10/11/10 134/84  05/04/10 132/90   Well controlled Continue meds

## 2010-11-30 NOTE — Assessment & Plan Note (Signed)
Needs f/u labs Check today 

## 2010-12-03 ENCOUNTER — Other Ambulatory Visit: Payer: Self-pay | Admitting: Internal Medicine

## 2010-12-03 DIAGNOSIS — R634 Abnormal weight loss: Secondary | ICD-10-CM

## 2010-12-05 ENCOUNTER — Other Ambulatory Visit: Payer: Self-pay | Admitting: Internal Medicine

## 2010-12-10 ENCOUNTER — Other Ambulatory Visit: Payer: Self-pay | Admitting: Internal Medicine

## 2010-12-11 ENCOUNTER — Telehealth: Payer: Self-pay | Admitting: Cardiology

## 2010-12-11 NOTE — Telephone Encounter (Signed)
I spoke with the Sheryl Bates and scheduled her to see Dr Riley Kill on 12/21/10.

## 2010-12-11 NOTE — Telephone Encounter (Signed)
Pt calling regarding referral from Dr. Birdie Sons office. Pt was scheduled appt to see Dr. Jens Som tomorrow at 2:30p but pt wanted to know if she could see Dr. Riley Kill. Pt was made aware that Dr. Riley Kill is no longer accepting new pts. Pt continued to say that Dr. Riley Kill was pt mothers cardiologists  [facility] ([birth year]), and would really prefer for Dr. Riley Kill to be pt cardiologists. Pt mother passed on 2024/09/21and there was confusion on who was to sign the death certificate--long story short Dr. Riley Kill called pt. Pt spoke with Dr. Riley Kill and asked if he would take her on as a pt and he said he would. Please return pt call to discuss further.

## 2010-12-12 ENCOUNTER — Institutional Professional Consult (permissible substitution): Payer: BC Managed Care – PPO | Admitting: Cardiology

## 2010-12-21 ENCOUNTER — Encounter: Payer: Self-pay | Admitting: Cardiology

## 2010-12-21 ENCOUNTER — Ambulatory Visit (INDEPENDENT_AMBULATORY_CARE_PROVIDER_SITE_OTHER): Payer: BC Managed Care – PPO | Admitting: Cardiology

## 2010-12-21 DIAGNOSIS — E785 Hyperlipidemia, unspecified: Secondary | ICD-10-CM

## 2010-12-21 DIAGNOSIS — I10 Essential (primary) hypertension: Secondary | ICD-10-CM

## 2010-12-21 DIAGNOSIS — R079 Chest pain, unspecified: Secondary | ICD-10-CM

## 2010-12-21 NOTE — Patient Instructions (Signed)
Your physician has requested that you have an exercise tolerance test. For further information please visit [email or web address]. Please also follow instruction sheet, as given.  Your physician recommends that you continue on your current medications as directed. Please refer to the Current Medication list given to you today.  

## 2010-12-23 NOTE — Progress Notes (Signed)
HPI:  This very nice lady is well known to me primarily through her mother.  Her mother died recently, the cause of which was unclear.  She had underlying CAD.  She is an only child, and has no children..  She has had rare chest pain.  One day before her mother died, she had a brief episode in the left upper chest.  It lasted one to two minutes.  Of note, they had been traveling, but had been getting out of the car and doing things, not going too far in any one day.  It scared her, but she was not short of breath, and had not diaphoresis.  Once along time ago, she had the same thing,  Overall, she has several risk factors, and wanted to get this checked out a bit.  Denies progressive symptoms.  She is going to weight watchers, and her husband is now willing, and he has stopped smoking.   She previously got some activity, up to three times per week, but has not been doing this as she has been stressed by her mother's death, and there have been some family dynamics.  She is able to go to weight watchers using her company flex card, so wants to do it if we can give her a note that it is warranted.  I certainly think it is.    Current Outpatient Prescriptions  Medication Sig Dispense Refill  . ALPRAZolam (XANAX) 0.5 MG tablet Take 1 tablet (0.5 mg total) by mouth 3 (three) times daily as needed for sleep or anxiety.  20 tablet  0  . aspirin 81 MG tablet Take 81 mg by mouth daily.        . beta carotene w/minerals (OCUVITE) tablet Take 1 tablet by mouth daily.        . Calcium Carbonate-Vitamin D (CALCIUM + D PO) Take by mouth 2 (two) times daily.        . hydrochlorothiazide (HYDRODIURIL) 25 MG tablet TAKE ONE TABLET BY MOUTH ONE TIME DAILY  30 tablet  4  . losartan (COZAAR) 50 MG tablet Take 1 tablet (50 mg total) by mouth daily.  90 tablet  3  . metoprolol tartrate (LOPRESSOR) 25 MG tablet Take 25 mg by mouth 2 (two) times daily.        . Multiple Vitamin (MULTIVITAMIN) tablet Take 2 tablets by mouth daily.        . Omega-3 Fatty Acids (FISH OIL) 1200 MG CAPS Take by mouth 2 (two) times daily.          Allergies  Allergen Reactions  . Lisinopril     REACTION: cough  . Sulfonamide Derivatives     Past Medical History  Diagnosis Date  . Hypertension   . Allergy   . Hyperlipidemia   . Endometriosis   . Ovarian cyst     right  . CIN I (cervical intraepithelial neoplasia I)   . Vitamin D deficiency     Past Surgical History  Procedure Date  . Knee surgery     Right  . Laparoscopy 1996  . Leep 1992  . Cervical biopsy  w/ loop electrode excision   . Pelvic laparoscopy     Family History  Problem Relation Age of Onset  . Hypertension Mother   . Heart disease Mother   . Diabetes Mother   . Heart disease Father     History   Social History  . Marital Status: Married    Spouse Name: N/A  Number of Children: N/A  . Years of Education: N/A   Occupational History  . Not on file.   Social History Main Topics  . Smoking status: Former Smoker    Quit date: 05/03/2005  . Smokeless tobacco: Never Used  . Alcohol Use: Yes     rare  . Drug Use: No  . Sexually Active: Yes    Birth Control/ Protection: Post-menopausal   Other Topics Concern  . Not on file   Social History Narrative  . No narrative on file    ROS: Please see the HPI.  All other systems reviewed and negative.  No swelling in the legs, erythema.  No prolonged shortness of breath.   PHYSICAL EXAM:  BP 132/70  Pulse 78  Ht 5\' 3"  (1.6 m)  Wt 86.637 kg (191 lb)  BMI 33.83 kg/m2  General:  Modestly overweight, well appearing female , in no acute distress. Head:  Normocephalic and atraumatic. Neck: no JVD Lungs: Clear to auscultation and percussion. Heart: Normal S1 and S2.  No murmur, rubs or gallops.  Abdomen:  Normal bowel sounds; soft; non tender; no organomegaly Pulses: Pulses normal in all 4 extremities. Extremities: No clubbing or cyanosis. No edema. Neurologic: Alert and oriented x  3.  EKG:  NSR.  Incomplete RBBB.  No acute changes.   ASSESSMENT AND PLAN:

## 2010-12-26 ENCOUNTER — Telehealth: Payer: Self-pay | Admitting: Internal Medicine

## 2010-12-26 NOTE — Telephone Encounter (Signed)
Her mom recently passed away and would like an anti-depressant. Would like a call back from a nurse.

## 2010-12-27 NOTE — Telephone Encounter (Signed)
I have never seen this pt before.  Perhaps Dr. Cato Mulligan will prescribe something for and have her follow up in the office.

## 2010-12-28 MED ORDER — CITALOPRAM HYDROBROMIDE 10 MG PO TABS: 10.0000 mg | ORAL_TABLET | Freq: Every day | ORAL | Status: DC

## 2010-12-28 NOTE — Telephone Encounter (Signed)
Citalopram 10 mg po qd, #30/3 refill

## 2010-12-28 NOTE — Telephone Encounter (Signed)
rx sent in electronically, pt aware 

## 2010-12-30 DIAGNOSIS — R079 Chest pain, unspecified: Secondary | ICD-10-CM | POA: Insufficient documentation

## 2010-12-30 NOTE — Assessment & Plan Note (Signed)
Patient has pos family history, and she has some risk factors, and symptoms, and wants to exercise.  Will recommend a POET to assess her exercise tolerance and exclude ischemia as the cause.  She has no findings to raise suspicion for PE.

## 2010-12-30 NOTE — Assessment & Plan Note (Signed)
Appears nicely controlled by Dr. Cato Mulligan.

## 2010-12-30 NOTE — Assessment & Plan Note (Signed)
Some elevation of LFTs at present, and she is not way over recommended guidelines.  Would not treat at this point.

## 2011-01-29 ENCOUNTER — Encounter: Payer: BC Managed Care – PPO | Admitting: Cardiology

## 2011-03-15 ENCOUNTER — Ambulatory Visit (INDEPENDENT_AMBULATORY_CARE_PROVIDER_SITE_OTHER): Payer: BC Managed Care – PPO | Admitting: Cardiology

## 2011-03-15 DIAGNOSIS — E785 Hyperlipidemia, unspecified: Secondary | ICD-10-CM

## 2011-03-15 DIAGNOSIS — I10 Essential (primary) hypertension: Secondary | ICD-10-CM

## 2011-03-15 NOTE — Progress Notes (Signed)
Exercise Treadmill Test  Pre-Exercise Testing Evaluation Rhythm: normal sinus  Rate: 70   PR:  18 QRS:  .08  QT:  43 QTc: 47     Test  Exercise Tolerance Test Ordering MD: Shawnie Pons, MD  Interpreting MD:  Shawnie Pons, MD  Unique Test No: 1  Treadmill:  1  Indication for ETT: chest pain - rule out ischemia  Contraindication to ETT: No   Stress Modality: exercise - treadmill  Cardiac Imaging Performed: non   Protocol: standard Bruce - maximal  Max BP:  201/69  Max MPHR (bpm):  163 85% MPR (bpm):  138  MPHR obtained (bpm):  144 % MPHR obtained:  88%  Reached 85% MPHR (min:sec):  7:13 Total Exercise Time (min-sec):  8:00  Workload in METS:  10.1 Borg Scale: 17  Reason ETT Terminated:  fatigue    ST Segment Analysis At Rest: normal ST segments - no evidence of significant ST depression With Exercise: no evidence of significant ST depression  Other Information Arrhythmia:  No.  Rare PVC Angina during ETT:  absent (0) Quality of ETT:  diagnostic  ETT Interpretation:  normal - no evidence of ischemia by ST analysis  Comments: Moderately good exercise tolerance with no evidence of exercise induced ST depression or chest pain.  There was a moderately prominent hypertensive response to exercise.      Recommendations: Encourage healthy lifestyle.  Follow up with Dr. Cato Mulligan.  Consideration of adjustment of BP meds and reevaluate after BP is brought down.  Follow up of LFTs also suggested.

## 2011-04-19 ENCOUNTER — Other Ambulatory Visit: Payer: Self-pay | Admitting: Internal Medicine

## 2011-04-22 ENCOUNTER — Other Ambulatory Visit: Payer: Self-pay | Admitting: Internal Medicine

## 2011-05-19 ENCOUNTER — Other Ambulatory Visit: Payer: Self-pay | Admitting: Internal Medicine

## 2011-05-20 ENCOUNTER — Other Ambulatory Visit: Payer: Self-pay | Admitting: *Deleted

## 2011-08-06 ENCOUNTER — Other Ambulatory Visit: Payer: Self-pay | Admitting: Internal Medicine

## 2011-09-30 ENCOUNTER — Other Ambulatory Visit: Payer: Self-pay | Admitting: Internal Medicine

## 2011-10-05 ENCOUNTER — Other Ambulatory Visit: Payer: Self-pay | Admitting: Internal Medicine

## 2011-11-06 ENCOUNTER — Other Ambulatory Visit: Payer: Self-pay | Admitting: Internal Medicine

## 2011-11-29 ENCOUNTER — Other Ambulatory Visit: Payer: Self-pay | Admitting: Internal Medicine

## 2011-12-06 ENCOUNTER — Other Ambulatory Visit: Payer: Self-pay | Admitting: Internal Medicine

## 2011-12-19 ENCOUNTER — Encounter: Payer: Self-pay | Admitting: Obstetrics and Gynecology

## 2011-12-31 ENCOUNTER — Ambulatory Visit (INDEPENDENT_AMBULATORY_CARE_PROVIDER_SITE_OTHER): Payer: BC Managed Care – PPO | Admitting: Obstetrics and Gynecology

## 2011-12-31 ENCOUNTER — Encounter: Payer: Self-pay | Admitting: Obstetrics and Gynecology

## 2011-12-31 ENCOUNTER — Other Ambulatory Visit (HOSPITAL_COMMUNITY)
Admission: RE | Admit: 2011-12-31 | Discharge: 2011-12-31 | Disposition: A | Discharge status: Home / Self Care | Payer: BC Managed Care – PPO | Source: Ambulatory Visit | Attending: Obstetrics and Gynecology | Admitting: Obstetrics and Gynecology

## 2011-12-31 VITALS — BP 140/86 | Ht 64.0 in | Wt 202.0 lb

## 2011-12-31 DIAGNOSIS — N870 Mild cervical dysplasia: Secondary | ICD-10-CM | POA: Insufficient documentation

## 2011-12-31 DIAGNOSIS — N83209 Unspecified ovarian cyst, unspecified side: Secondary | ICD-10-CM | POA: Insufficient documentation

## 2011-12-31 DIAGNOSIS — E559 Vitamin D deficiency, unspecified: Secondary | ICD-10-CM | POA: Insufficient documentation

## 2011-12-31 DIAGNOSIS — E785 Hyperlipidemia, unspecified: Secondary | ICD-10-CM | POA: Insufficient documentation

## 2011-12-31 DIAGNOSIS — Z01419 Encounter for gynecological examination (general) (routine) without abnormal findings: Secondary | ICD-10-CM | POA: Insufficient documentation

## 2011-12-31 DIAGNOSIS — N809 Endometriosis, unspecified: Secondary | ICD-10-CM | POA: Insufficient documentation

## 2011-12-31 DIAGNOSIS — I10 Essential (primary) hypertension: Secondary | ICD-10-CM | POA: Insufficient documentation

## 2011-12-31 NOTE — Patient Instructions (Signed)
We will call you with vitamin d level.

## 2011-12-31 NOTE — Progress Notes (Signed)
Patient came to see me today for her annual GYN exam. She is now had 3 normal bone densities. Her last bone density was 2010. She is up-to-date on mammograms. She has a past history of vitamin D deficiency. For a while she did 50,000 IUs of vitamin D weekly/every other week. We switched her last year to 1000 IUs daily. She does lab with her PCP. She is having no vaginal bleeding. She is having no pelvic pain. She had a LEEP for cervical dysplasia in 1992. She has had normal Pap smears since that. Her last Pap was 2012.  Physical examination:Sheryl Bates present. HEENT within normal limits. Neck: Thyroid not large. No masses. Supraclavicular nodes: not enlarged. Breasts: Examined in both sitting and lying  position. No skin changes and no masses. Abdomen: Soft no guarding rebound or masses or hernia. Pelvic: External: Within normal limits. BUS: Within normal limits. Vaginal:within normal limits. Good estrogen effect. No evidence of cystocele rectocele or enterocele. Cervix: clean. Uterus: Normal size and shape. Adnexa: No masses. Rectovaginal exam: Confirmatory and negative. Extremities: Within normal limits.  Assessment: #1. Cervical dysplasia #2. Vitamin D deficiency  Plan: Continue yearly mammograms. Vitamin D level checked. Pap done at patient request.The new Pap smear guidelines were discussed with the patient.

## 2012-01-01 LAB — URINALYSIS W MICROSCOPIC + REFLEX CULTURE
Bacteria, UA: NONE SEEN
Bilirubin Urine: NEGATIVE
Casts: NONE SEEN
Crystals: NONE SEEN
Glucose, UA: NEGATIVE mg/dL
Hgb urine dipstick: NEGATIVE
Ketones, ur: NEGATIVE mg/dL
Leukocytes, UA: NEGATIVE
Nitrite: NEGATIVE
Protein, ur: NEGATIVE mg/dL
Specific Gravity, Urine: 1.015 (ref 1.005–1.030)
Urobilinogen, UA: 0.2 mg/dL (ref 0.0–1.0)
pH: 7.5 (ref 5.0–8.0)

## 2012-01-01 LAB — VITAMIN D 25 HYDROXY (VIT D DEFICIENCY, FRACTURES): Vit D, 25-Hydroxy: 30 ng/mL (ref 30–89)

## 2012-01-10 ENCOUNTER — Encounter: Payer: Self-pay | Admitting: Obstetrics and Gynecology

## 2012-01-10 ENCOUNTER — Other Ambulatory Visit: Payer: Self-pay | Admitting: Internal Medicine

## 2012-07-09 ENCOUNTER — Other Ambulatory Visit: Payer: Self-pay | Admitting: Internal Medicine

## 2013-04-16 ENCOUNTER — Telehealth: Payer: Self-pay | Admitting: Internal Medicine

## 2013-04-16 MED ORDER — LOSARTAN POTASSIUM 50 MG PO TABS: ORAL_TABLET | ORAL | Status: DC

## 2013-04-16 MED ORDER — METOPROLOL TARTRATE 25 MG PO TABS: 25.0000 mg | ORAL_TABLET | Freq: Two times a day (BID) | ORAL | Status: DC

## 2013-04-16 NOTE — Telephone Encounter (Signed)
rx sent in electronically 

## 2013-04-16 NOTE — Telephone Encounter (Addendum)
Pt has appt on 07-13-13 for cpx. Pt needs refills on losartan and metoprolol call into [facility]

## 2013-07-13 ENCOUNTER — Encounter: Payer: BC Managed Care – PPO | Admitting: Internal Medicine

## 2013-07-19 ENCOUNTER — Ambulatory Visit (INDEPENDENT_AMBULATORY_CARE_PROVIDER_SITE_OTHER): Payer: No Typology Code available for payment source | Admitting: Internal Medicine

## 2013-07-19 ENCOUNTER — Encounter: Payer: Self-pay | Admitting: Internal Medicine

## 2013-07-19 VITALS — BP 180/98 | HR 72 | Temp 98.0°F | Ht 65.0 in | Wt 196.0 lb

## 2013-07-19 DIAGNOSIS — Z0000 Encounter for general adult medical examination without abnormal findings: Secondary | ICD-10-CM

## 2013-07-19 DIAGNOSIS — I10 Essential (primary) hypertension: Secondary | ICD-10-CM

## 2013-07-19 LAB — BASIC METABOLIC PANEL
BUN: 11 mg/dL (ref 6–23)
CO2: 27 mEq/L (ref 19–32)
Calcium: 9.4 mg/dL (ref 8.4–10.5)
Chloride: 106 mEq/L (ref 96–112)
Creatinine, Ser: 0.7 mg/dL (ref 0.4–1.2)
GFR: 92.42 mL/min (ref >60.00)
Glucose, Bld: 94 mg/dL (ref 70–99)
Potassium: 4.1 mEq/L (ref 3.5–5.1)
Sodium: 140 mEq/L (ref 135–145)

## 2013-07-19 LAB — CBC WITH DIFFERENTIAL/PLATELET
Basophils Absolute: 0 10*3/uL (ref 0.0–0.1)
Basophils Relative: 0.4 % (ref 0.0–3.0)
Eosinophils Absolute: 0.1 10*3/uL (ref 0.0–0.7)
Eosinophils Relative: 1.1 % (ref 0.0–5.0)
HCT: 45.2 % (ref 36.0–46.0)
Hemoglobin: 15 g/dL (ref 12.0–15.0)
Lymphocytes Relative: 35.3 % (ref 12.0–46.0)
Lymphs Abs: 2 10*3/uL (ref 0.7–4.0)
MCHC: 33.1 g/dL (ref 30.0–36.0)
MCV: 92.9 fl (ref 78.0–100.0)
Monocytes Absolute: 0.4 10*3/uL (ref 0.1–1.0)
Monocytes Relative: 7.4 % (ref 3.0–12.0)
Neutro Abs: 3.1 10*3/uL (ref 1.4–7.7)
Neutrophils Relative %: 55.8 % (ref 43.0–77.0)
Platelets: 209 10*3/uL (ref 150.0–400.0)
RBC: 4.87 Mil/uL (ref 3.87–5.11)
RDW: 12.5 % (ref 11.5–15.5)
WBC: 5.6 10*3/uL (ref 4.0–10.5)

## 2013-07-19 LAB — POCT URINALYSIS DIPSTICK
Bilirubin, UA: NEGATIVE
Blood, UA: NEGATIVE
Glucose, UA: NEGATIVE
Ketones, UA: NEGATIVE
Nitrite, UA: NEGATIVE
Protein, UA: NEGATIVE
Spec Grav, UA: >=1.03
Urobilinogen, UA: 0.2
pH, UA: 6

## 2013-07-19 LAB — LIPID PANEL
Cholesterol: 172 mg/dL (ref 0–200)
HDL: 57.4 mg/dL (ref >39.00)
LDL Cholesterol: 98 mg/dL (ref 0–99)
NonHDL: 114.6
Total CHOL/HDL Ratio: 3
Triglycerides: 82 mg/dL (ref 0.0–149.0)
VLDL: 16.4 mg/dL (ref 0.0–40.0)

## 2013-07-19 LAB — HEPATIC FUNCTION PANEL
ALT: 43 U/L — ABNORMAL HIGH (ref 0–35)
AST: 28 U/L (ref 0–37)
Albumin: 4.1 g/dL (ref 3.5–5.2)
Alkaline Phosphatase: 70 U/L (ref 39–117)
Bilirubin, Direct: 0.1 mg/dL (ref 0.0–0.3)
Total Bilirubin: 0.5 mg/dL (ref 0.2–1.2)
Total Protein: 6.5 g/dL (ref 6.0–8.3)

## 2013-07-19 LAB — TSH: TSH: 0.43 u[IU]/mL (ref 0.35–4.50)

## 2013-07-19 MED ORDER — LOSARTAN POTASSIUM 100 MG PO TABS: 100.0000 mg | ORAL_TABLET | Freq: Every day | ORAL | Status: DC

## 2013-07-19 MED ORDER — METOPROLOL TARTRATE 50 MG PO TABS: 50.0000 mg | ORAL_TABLET | Freq: Two times a day (BID) | ORAL | Status: DC

## 2013-07-19 MED ORDER — HYDROCHLOROTHIAZIDE 25 MG PO TABS
25.0000 mg | ORAL_TABLET | Freq: Every day | ORAL | Status: DC
Start: 2013-07-19 — End: 2014-08-10

## 2013-07-19 MED ORDER — CITALOPRAM HYDROBROMIDE 20 MG PO TABS: 20.0000 mg | ORAL_TABLET | Freq: Every day | ORAL | Status: DC

## 2013-07-19 MED ORDER — LOSARTAN POTASSIUM 50 MG PO TABS: ORAL_TABLET | ORAL | Status: DC

## 2013-07-19 NOTE — Progress Notes (Signed)
Pre visit review using our clinic review tool, if applicable. No additional management support is needed unless otherwise documented below in the visit note. 

## 2013-07-19 NOTE — Progress Notes (Signed)
Lot's of stress Here for cpx She is not taking losartan  Only med has been metoprolol once daily  She admits to depression and anxiety- citalopram has helped previously- not taking at this time.   Past Medical History  Diagnosis Date  . Allergy   . Hyperlipidemia   . Ovarian cyst     right  . CIN I (cervical intraepithelial neoplasia I)   . Vitamin D deficiency   . Hypertension   . Endometriosis     History   Social History  . Marital Status: Married    Spouse Name: N/A    Number of Children: N/A  . Years of Education: N/A   Occupational History  . Not on file.   Social History Main Topics  . Smoking status: Former Smoker    Quit date: 05/03/2005  . Smokeless tobacco: Never Used  . Alcohol Use: Yes     Comment: rare  . Drug Use: No  . Sexual Activity: No   Other Topics Concern  . Not on file   Social History Narrative   She is now raising a granddaughter (planning to adopt as of 07/19/2013)    Past Surgical History  Procedure Laterality Date  . Knee surgery      Right  . Laparoscopy  1996  . Leep  1992  . Cervical biopsy  w/ loop electrode excision    . Pelvic laparoscopy    . Colposcopy      Family History  Problem Relation Age of Onset  . Hypertension Mother   . Heart disease Mother   . Diabetes Mother   . Heart disease Father 39    CHF    Allergies  Allergen Reactions  . Lisinopril     REACTION: cough  . Sulfonamide Derivatives      Reviewed meds Note she is not taking several meds.    patient denies chest pain, shortness of breath, orthopnea. Denies lower extremity edema, abdominal pain, change in appetite, change in bowel movements. Patient denies rashes, musculoskeletal complaints. No other specific complaints in a complete review of systems.   BP 180/98  Pulse 72  Temp(Src) 98 F (36.7 C) (Oral)  Ht 5\' 5"  (1.651 m)  Wt 196 lb (88.905 kg)  BMI 32.62 kg/m2  Well-developed well-nourished female in no acute distress. HEENT exam  atraumatic, normocephalic, extraocular muscles are intact. Neck is supple. No jugular venous distention no thyromegaly. Chest clear to auscultation without increased work of breathing. Cardiac exam S1 and S2 are regular, she has an s3. Abdominal exam active bowel sounds, soft, nontender. Extremities no edema. Neurologic exam she is alert without any motor sensory deficits. Gait is normal.   Well visit- health maint utd  Hypertension She has been noncompliant with meds Will restart meds Discussed with patient  bp check in 3 days

## 2013-07-22 NOTE — Assessment & Plan Note (Signed)
She has been noncompliant with meds Will restart meds Discussed with patient  bp check in 3 days

## 2013-07-26 ENCOUNTER — Ambulatory Visit (INDEPENDENT_AMBULATORY_CARE_PROVIDER_SITE_OTHER): Payer: No Typology Code available for payment source | Admitting: *Deleted

## 2013-07-26 VITALS — BP 110/84

## 2013-07-26 DIAGNOSIS — I10 Essential (primary) hypertension: Secondary | ICD-10-CM

## 2013-08-03 NOTE — Progress Notes (Signed)
Late documentation, pt comes in on 07/26/13 for a nurse visit bp check.  Her bp was checked by Earle GellAlisha Millner, CMA and it was 110/84.  She is taking HCTZ 25mg  once daily, losartan 100 mg once daily, and metoprolol 50 mg once daily.  Pt was instructed to continue current meds and we would call if anything needed to be changed.  Note forwarded to Dr Cato MulliganSwords for review

## 2013-10-29 ENCOUNTER — Telehealth: Payer: Self-pay | Admitting: Internal Medicine

## 2013-10-29 NOTE — Telephone Encounter (Signed)
Pt would like to transfer to Dr. Fabian Sharp. Please advise if okay.

## 2013-11-24 ENCOUNTER — Encounter: Payer: Self-pay | Admitting: Gynecology

## 2013-11-24 ENCOUNTER — Ambulatory Visit (INDEPENDENT_AMBULATORY_CARE_PROVIDER_SITE_OTHER): Payer: No Typology Code available for payment source | Admitting: Gynecology

## 2013-11-24 VITALS — BP 140/70 | HR 60 | Resp 18 | Ht 64.0 in | Wt 202.0 lb

## 2013-11-24 DIAGNOSIS — E2839 Other primary ovarian failure: Secondary | ICD-10-CM

## 2013-11-24 DIAGNOSIS — Z124 Encounter for screening for malignant neoplasm of cervix: Secondary | ICD-10-CM

## 2013-11-24 DIAGNOSIS — Z01419 Encounter for gynecological examination (general) (routine) without abnormal findings: Secondary | ICD-10-CM

## 2013-11-24 DIAGNOSIS — E559 Vitamin D deficiency, unspecified: Secondary | ICD-10-CM

## 2013-11-24 DIAGNOSIS — F418 Other specified anxiety disorders: Secondary | ICD-10-CM

## 2013-11-24 MED ORDER — CITALOPRAM HYDROBROMIDE 20 MG PO TABS: 20.0000 mg | ORAL_TABLET | Freq: Every day | ORAL | Status: DC

## 2013-11-24 NOTE — Progress Notes (Addendum)
58 y.o. Married Caucasian female   G1P0010 here for annual exam.She does report hot flashes, does have night sweats, does have vaginal dryness.  She is not using lubricants.  She does not report post-menopasual bleeding.  Pt does not have any issues with her endometriosis.  No HRT.  No LMP recorded. Patient is postmenopausal.          Sexually active: No.  The current method of family planning is post menopausal status.    Exercising: No.  The patient does not participate in regular exercise at present. Last pap: 12/2011 Neg Abnormal PAP: yes, CIN I (cervical intraepithelial neoplasia I) [N87.0],  Leep 1992 Mammogram: 11/02/13 BIRADS2:Benign  BSE: No Colonoscopy: 2009  DEXA: 08/2008 Alcohol: No Tobacco: former quit 2008, 1/2ppd  Health Maintenance  Topic Date Due  . Influenza Vaccine  09/11/2013  . Pap Smear  01/19/2015  . Mammogram  11/03/2015  . Colonoscopy  08/06/2017  . Tetanus/tdap  07/07/2019    Family History  Problem Relation Age of Onset  . Hypertension Mother   . Heart disease Mother   . Diabetes Mother   . Heart disease Father [id number]    CHF    Patient Active Problem List   Diagnosis Date Noted  . Hypertension   . Endometriosis   . CIN I (cervical intraepithelial neoplasia I)   . Ovarian cyst   . Vitamin D deficiency   . Hyperlipidemia     Past Medical History  Diagnosis Date  . Allergy   . Hyperlipidemia   . Ovarian cyst     right  . CIN I (cervical intraepithelial neoplasia I)   . Vitamin D deficiency   . Hypertension   . Endometriosis     Past Surgical History  Procedure Laterality Date  . Knee surgery      Right  . Laparoscopy  1996  . Leep  1992  . Cervical biopsy  w/ loop electrode excision    . Pelvic laparoscopy    . Colposcopy      Allergies: Lisinopril and Sulfonamide derivatives  Current Outpatient Prescriptions  Medication Sig Dispense Refill  . aspirin 81 MG tablet Take 81 mg by mouth daily.        . citalopram (CELEXA) 20 MG  tablet Take 1 tablet (20 mg total) by mouth daily.  90 tablet  3  . hydrochlorothiazide (HYDRODIURIL) 25 MG tablet Take 1 tablet (25 mg total) by mouth daily.  90 tablet  3  . losartan (COZAAR) 100 MG tablet Take 1 tablet (100 mg total) by mouth daily. TAKE ONE TABLET BY MOUTH ONE TIME DAILY  90 tablet  3  . metoprolol tartrate (LOPRESSOR) 50 MG tablet Take 1 tablet (50 mg total) by mouth 2 (two) times daily.  180 tablet  3   No current facility-administered medications for this visit.    ROS: Pertinent items are noted in HPI.  Exam:    BP 140/70  Pulse 60  Resp 18  Ht 5\' 4"  (1.626 m)  Wt 202 lb (91.627 kg)  BMI 34.66 kg/m2 Weight change: @WEIGHTCHANGE @ Last 3 height recordings:  Ht Readings from Last 3 Encounters:  11/24/13 5\' 4"  (1.626 m)  07/19/13 5\' 5"  (1.651 m)  12/31/11 5\' 4"  (1.626 m)   General appearance: alert, cooperative and appears stated age Head: Normocephalic, without obvious abnormality, atraumatic Neck: no adenopathy, no carotid bruit, no JVD, supple, symmetrical, trachea midline and thyroid not enlarged, symmetric, no tenderness/mass/nodules Lungs: clear to auscultation bilaterally Breasts:  normal appearance, no masses or tenderness Heart: regular rate and rhythm, S1, S2 normal, no murmur, click, rub or gallop Abdomen: soft, non-tender; bowel sounds normal; no masses,  no organomegaly Extremities: extremities normal, atraumatic, no cyanosis or edema Skin: Skin color, texture, turgor normal. No rashes or lesions Lymph nodes: Cervical, supraclavicular, and axillary nodes normal. no inguinal nodes palpated Neurologic: Grossly normal   Pelvic: External genitalia:  no lesions              Urethra: normal appearing urethra with no masses, tenderness or lesions              Bartholins and Skenes: normal                 Vagina: atrophic              Cervix: normal appearance              Pap taken: Yes.          Bimanual Exam:  Uterus:  uterus is normal size,  shape, consistency and nontender                                      Adnexa:    no masses                                      Rectovaginal: Confirms                                      Anus:  normal sphincter tone, no lesions       1. Encounter for routine gynecological examination counseled on breast self exam, mammography screening, osteoporosis, adequate intake of calcium and vitamin D, diet and exercise return annually or prn Discussed PAP guideline changes, importance of weight bearing exercises, calcium, vit D and balanced diet.   2. Screening for cervical cancer Guidelines reviewed, questions addressed - Pap Test with HP (IPS)  3. Estrogen deficiency Previous DEXA's normal last 2010 - DG Bone Density; Future  4. Vitamin D deficiency  - Vit D  25 hydroxy (rtn osteoporosis monitoring)  5. Situational anxiety Social issues at home, doing well, not interested in stopping at this time - citalopram (CELEXA) 20 MG tablet; Take 1 tablet (20 mg total) by mouth daily.  Dispense: 30 tablet; Refill: 11  An After Visit Summary was printed and given to the patient.   Add:  [facility] path CIN I 1992

## 2013-11-25 LAB — IPS PAP TEST WITH HPV

## 2013-11-25 LAB — VITAMIN D 25 HYDROXY (VIT D DEFICIENCY, FRACTURES): Vit D, 25-Hydroxy: 23 ng/mL — ABNORMAL LOW (ref 30–89)

## 2013-11-26 ENCOUNTER — Other Ambulatory Visit: Payer: Self-pay

## 2013-11-26 NOTE — Addendum Note (Signed)
Addended by: Douglass RiversLATHROP, Lenton Gendreau on: 11/26/2013 01:31 PM   Modules accepted: Orders

## 2013-12-13 ENCOUNTER — Encounter: Payer: Self-pay | Admitting: Gynecology

## 2014-01-19 ENCOUNTER — Telehealth: Payer: Self-pay | Admitting: Gynecology

## 2014-01-19 NOTE — Telephone Encounter (Signed)
Patient says she returning a call from billing?

## 2014-08-10 ENCOUNTER — Other Ambulatory Visit: Payer: Self-pay | Admitting: Internal Medicine

## 2014-08-25 ENCOUNTER — Other Ambulatory Visit: Payer: Self-pay | Admitting: Internal Medicine

## 2014-10-22 ENCOUNTER — Other Ambulatory Visit: Payer: Self-pay | Admitting: Internal Medicine

## 2014-10-31 ENCOUNTER — Other Ambulatory Visit: Payer: Self-pay | Admitting: Internal Medicine

## 2014-11-11 ENCOUNTER — Encounter: Payer: Self-pay | Admitting: Gastroenterology

## 2014-12-05 ENCOUNTER — Other Ambulatory Visit: Payer: Self-pay

## 2015-08-21 ENCOUNTER — Other Ambulatory Visit: Payer: Self-pay | Admitting: Obstetrics & Gynecology

## 2015-08-21 ENCOUNTER — Other Ambulatory Visit (HOSPITAL_COMMUNITY)
Admission: RE | Admit: 2015-08-21 | Discharge: 2015-08-21 | Disposition: A | Discharge status: Home / Self Care | Payer: BLUE CROSS/BLUE SHIELD | Source: Ambulatory Visit | Attending: Obstetrics & Gynecology | Admitting: Obstetrics & Gynecology

## 2015-08-21 DIAGNOSIS — Z01419 Encounter for gynecological examination (general) (routine) without abnormal findings: Secondary | ICD-10-CM | POA: Insufficient documentation

## 2015-08-21 DIAGNOSIS — Z1151 Encounter for screening for human papillomavirus (HPV): Secondary | ICD-10-CM | POA: Insufficient documentation

## 2015-08-23 LAB — CYTOLOGY - PAP

## 2016-03-10 ENCOUNTER — Emergency Department (HOSPITAL_BASED_OUTPATIENT_CLINIC_OR_DEPARTMENT_OTHER)
Admission: EM | Admit: 2016-03-10 | Discharge: 2016-03-11 | Disposition: A | Discharge status: Home / Self Care | Payer: BLUE CROSS/BLUE SHIELD | Attending: Emergency Medicine | Admitting: Emergency Medicine

## 2016-03-10 ENCOUNTER — Encounter (HOSPITAL_BASED_OUTPATIENT_CLINIC_OR_DEPARTMENT_OTHER): Payer: Self-pay | Admitting: Emergency Medicine

## 2016-03-10 ENCOUNTER — Emergency Department (HOSPITAL_BASED_OUTPATIENT_CLINIC_OR_DEPARTMENT_OTHER): Payer: BLUE CROSS/BLUE SHIELD

## 2016-03-10 DIAGNOSIS — M545 Low back pain: Secondary | ICD-10-CM | POA: Insufficient documentation

## 2016-03-10 DIAGNOSIS — R1032 Left lower quadrant pain: Secondary | ICD-10-CM | POA: Insufficient documentation

## 2016-03-10 DIAGNOSIS — R197 Diarrhea, unspecified: Secondary | ICD-10-CM | POA: Diagnosis not present

## 2016-03-10 DIAGNOSIS — R931 Abnormal findings on diagnostic imaging of heart and coronary circulation: Secondary | ICD-10-CM | POA: Diagnosis not present

## 2016-03-10 DIAGNOSIS — R112 Nausea with vomiting, unspecified: Secondary | ICD-10-CM | POA: Diagnosis not present

## 2016-03-10 DIAGNOSIS — R1012 Left upper quadrant pain: Secondary | ICD-10-CM | POA: Diagnosis not present

## 2016-03-10 DIAGNOSIS — Z79899 Other long term (current) drug therapy: Secondary | ICD-10-CM | POA: Insufficient documentation

## 2016-03-10 DIAGNOSIS — I10 Essential (primary) hypertension: Secondary | ICD-10-CM | POA: Diagnosis not present

## 2016-03-10 DIAGNOSIS — Z87891 Personal history of nicotine dependence: Secondary | ICD-10-CM | POA: Diagnosis not present

## 2016-03-10 DIAGNOSIS — R52 Pain, unspecified: Secondary | ICD-10-CM

## 2016-03-10 DIAGNOSIS — R109 Unspecified abdominal pain: Secondary | ICD-10-CM

## 2016-03-10 LAB — COMPREHENSIVE METABOLIC PANEL
ALT: 43 U/L (ref 14–54)
AST: 36 U/L (ref 15–41)
Albumin: 4.3 g/dL (ref 3.5–5.0)
Alkaline Phosphatase: 59 U/L (ref 38–126)
Anion gap: 12 (ref 5–15)
BUN: 16 mg/dL (ref 6–20)
CO2: 25 mmol/L (ref 22–32)
Calcium: 9.4 mg/dL (ref 8.9–10.3)
Chloride: 99 mmol/L — ABNORMAL LOW (ref 101–111)
Creatinine, Ser: 0.85 mg/dL (ref 0.44–1.00)
GFR calc Af Amer: >60 mL/min (ref >60)
GFR calc non Af Amer: >60 mL/min (ref >60)
Glucose, Bld: 168 mg/dL — ABNORMAL HIGH (ref 65–99)
Potassium: 3.5 mmol/L (ref 3.5–5.1)
Sodium: 136 mmol/L (ref 135–145)
Total Bilirubin: 0.6 mg/dL (ref 0.3–1.2)
Total Protein: 7 g/dL (ref 6.5–8.1)

## 2016-03-10 LAB — CBC WITH DIFFERENTIAL/PLATELET
Basophils Absolute: 0 10*3/uL (ref 0.0–0.1)
Basophils Relative: 0 %
Eosinophils Absolute: 0 10*3/uL (ref 0.0–0.7)
Eosinophils Relative: 0 %
HCT: 42.3 % (ref 36.0–46.0)
Hemoglobin: 14.5 g/dL (ref 12.0–15.0)
Lymphocytes Relative: 17 %
Lymphs Abs: 1.2 10*3/uL (ref 0.7–4.0)
MCH: 31.1 pg (ref 26.0–34.0)
MCHC: 34.3 g/dL (ref 30.0–36.0)
MCV: 90.8 fL (ref 78.0–100.0)
Monocytes Absolute: 0.9 10*3/uL (ref 0.1–1.0)
Monocytes Relative: 12 %
Neutro Abs: 5.1 10*3/uL (ref 1.7–7.7)
Neutrophils Relative %: 71 %
Platelets: 216 10*3/uL (ref 150–400)
RBC: 4.66 MIL/uL (ref 3.87–5.11)
RDW: 11.5 % (ref 11.5–15.5)
WBC: 7.2 10*3/uL (ref 4.0–10.5)

## 2016-03-10 LAB — TROPONIN I: Troponin I: <0.03 ng/mL (ref <0.03)

## 2016-03-10 LAB — I-STAT CG4 LACTIC ACID, ED: Lactic Acid, Venous: 2.59 mmol/L (ref 0.5–1.9)

## 2016-03-10 MED ORDER — ONDANSETRON HCL 4 MG/2ML IJ SOLN
4.0000 mg | Freq: Once | INTRAMUSCULAR | Status: AC
  Administered 2016-03-10: 4 mg via INTRAVENOUS
  Filled 2016-03-10: qty 2

## 2016-03-10 MED ORDER — HYDROMORPHONE HCL 1 MG/ML IJ SOLN
1.0000 mg | Freq: Once | INTRAMUSCULAR | Status: AC
Start: 2016-03-10 — End: 2016-03-10
  Administered 2016-03-10: 1 mg via INTRAVENOUS
  Filled 2016-03-10: qty 1

## 2016-03-10 NOTE — ED Triage Notes (Signed)
Left flank and abdominal pain since 2 pm  - patient is pale and diaphoretic in triage. Moaning is constant pain

## 2016-03-10 NOTE — ED Provider Notes (Signed)
[facility] Provider Note   CSN: [id number] Arrival date & time: 03/10/16  2146  By signing my name below, I, Sheryl Bates, attest that this documentation has been prepared under the direction and in the presence of Sheryl Monday, MD. Electronically Signed: Modena Bates, Scribe. 03/10/2016. 10:20 PM.  History   Chief Complaint Chief Complaint  Patient presents with  . Flank Pain   The history is provided by the patient. No language interpreter was used.   HPI Comments: Sheryl Bates is a 61 y.o. female who presents to the Emergency Department complaining of constant moderate left flank pain that started about 8 hours ago. She states her pain started when she first came into her house after church and has been worsening since. It began suddenly in her left back and radiated to left abdomen. Her pain is exacerbated by movement and radiates to her left-sided lower back and LLQ abdomen. She reports associated nausea, vomiting (several episodes at least 5), and diarrhea (2 episodes). She denies any hx of kidey stones, hx of diverticulitis, use of blood thinners, chest pain, SOB, urinary symptoms, numbness, weakness, or fever.     PCP: Sheryl Dubonnet, MD  Past Medical History:  Diagnosis Date  . Allergy   . CIN I (cervical intraepithelial neoplasia I)   . Endometriosis   . Hyperlipidemia   . Hypertension   . Ovarian cyst    right  . Vitamin D deficiency     Patient Active Problem List   Diagnosis Date Noted  . Hypertension   . Endometriosis   . CIN I (cervical intraepithelial neoplasia I)   . Ovarian cyst   . Vitamin D deficiency   . Hyperlipidemia     Past Surgical History:  Procedure Laterality Date  . CERVICAL BIOPSY  W/ LOOP ELECTRODE EXCISION    . COLPOSCOPY    . KNEE SURGERY     Right  . LAPAROSCOPY  1996  . LEEP  1992  . PELVIC LAPAROSCOPY      OB History    Gravida Para Term Preterm AB Living   1       1 0   SAB TAB Ectopic  Multiple Live Births                   Home Medications    Prior to Admission medications   Medication Sig Start Date End Date Taking? Authorizing Provider  metoprolol succinate (TOPROL-XL) 100 MG 24 hr tablet Take 100 mg by mouth daily. Take with or immediately following a meal.   Yes Historical Provider, MD  aspirin 81 MG tablet Take 81 mg by mouth daily.      Historical Provider, MD  citalopram (CELEXA) 20 MG tablet Take 1 tablet (20 mg total) by mouth daily. 11/24/13   [name], MD  hydrochlorothiazide (HYDRODIURIL) 25 MG tablet TAKE ONE TABLET BY MOUTH ONCE DAILY 08/10/14   [name], MD  HYDROcodone-acetaminophen (NORCO/VICODIN) 5-325 MG tablet Take 1-2 tablets by mouth every 6 (six) hours as needed. 03/11/16   [name], DO  losartan (COZAAR) 100 MG tablet Take 1 tablet (100 mg total) by mouth daily. TAKE ONE TABLET BY MOUTH ONE TIME DAILY 07/19/13   [name], MD  metoprolol tartrate (LOPRESSOR) 50 MG tablet Take 1 tablet (50 mg total) by mouth 2 (two) times daily. 07/19/13   [name], MD  ondansetron (ZOFRAN ODT) 4 MG disintegrating tablet Take 1 tablet (4 mg total) by  mouth every 8 (eight) hours as needed for nausea or vomiting. 03/11/16   [name], DO    Family History Family History  Problem Relation Age of Onset  . Hypertension Mother   . Heart disease Mother   . Diabetes Mother   . Heart disease Father 73    CHF    Social History Social History  Substance Use Topics  . Smoking status: Former Smoker    Quit date: 05/03/2005  . Smokeless tobacco: Never Used  . Alcohol use No     Comment: rare     Allergies   Lisinopril and Sulfonamide derivatives   Review of Systems Review of Systems  Constitutional: Negative for fever.  HENT: Negative for sore throat.   Eyes: Negative for visual disturbance.  Respiratory: Negative for cough and shortness of breath.   Cardiovascular: Negative for chest pain.  Gastrointestinal: Positive for  abdominal pain (LLQ), diarrhea (twice), nausea and vomiting (several).  Genitourinary: Positive for flank pain (Left). Negative for difficulty urinating, dysuria, hematuria and urgency.  Musculoskeletal: Positive for back pain (Left lower). Negative for neck pain.  Skin: Negative for rash.  Neurological: Negative for syncope, weakness, numbness and headaches.  Hematological: Does not bruise/bleed easily.     Physical Exam Updated Vital Signs BP 111/82 (BP Location: Right Arm)   Pulse 62   Temp 97.9 F (36.6 C) (Oral)   Resp 18   Ht 5\' 3"  (1.6 m)   Wt 197 lb (89.4 kg)   SpO2 100%   BMI 34.90 kg/m   Physical Exam  Constitutional: She is oriented to person, place, and time. She appears well-developed and well-nourished. She appears distressed.  Patient moaning with pain  HENT:  Head: Normocephalic.  Eyes: Conjunctivae are normal.  Neck: Neck supple.  Cardiovascular: Normal rate, regular rhythm and normal heart sounds.  Exam reveals no gallop and no friction rub.   No murmur heard. Pulses:      Posterior tibial pulses are 2+ on the right side, and 2+ on the left side.  Pulmonary/Chest: Effort normal. No respiratory distress. She has no wheezes. She has no rales.  Abdominal: Soft. There is tenderness (LUQ, LLQ).  Musculoskeletal: Normal range of motion.  Left lower back TTP.   Neurological: She is alert and oriented to person, place, and time.  Skin: Skin is warm. She is diaphoretic.  Psychiatric: She has a normal mood and affect.  Nursing note and vitals reviewed.    ED Treatments / Results  DIAGNOSTIC STUDIES: Oxygen Saturation is 100% on RA, normal by my interpretation.    COORDINATION OF CARE: 10:25 PM- Pt advised of plan for treatment and pt agrees.  Labs (all labs ordered are listed, but only abnormal results are displayed) Labs Reviewed  COMPREHENSIVE METABOLIC PANEL - Abnormal; Notable for the following:       Result Value   Chloride 99 (*)    Glucose, Bld  168 (*)    All other components within normal limits  URINALYSIS, ROUTINE W REFLEX MICROSCOPIC - Abnormal; Notable for the following:    Specific Gravity, Urine >1.046 (*)    Hgb urine dipstick TRACE (*)    Ketones, ur 15 (*)    All other components within normal limits  URINALYSIS, MICROSCOPIC (REFLEX) - Abnormal; Notable for the following:    Bacteria, UA FEW (*)    Squamous Epithelial / LPF 0-5 (*)    All other components within normal limits  I-STAT CG4 LACTIC ACID, ED - Abnormal;  Notable for the following:    Lactic Acid, Venous 2.59 (*)    All other components within normal limits  URINE CULTURE  CBC WITH DIFFERENTIAL/PLATELET  TROPONIN I  TROPONIN I  LIPASE, BLOOD  I-STAT CG4 LACTIC ACID, ED    EKG  EKG Interpretation  Date/Time:  Bates March 11 2016 03:40:36 EST Ventricular Rate:  60 PR Interval:    QRS Duration: 99 QT Interval:  465 QTC Calculation: 465 R Axis:   -3 Text Interpretation:  Sinus rhythm Confirmed by WARD,  DO, KRISTEN ([id number]) on 03/11/2016 3:53:03 AM Also confirmed by WARD,  DO, KRISTEN [phone]), editor [place]  CCT, BEVERLY ([id number])  on 03/11/2016 6:50:57 AM       Radiology Ct Renal Stone Study  Result Date: 03/11/2016 CLINICAL DATA:  Left flank abdominal pain, onset at 14:00 EXAM: CT ABDOMEN AND PELVIS WITHOUT CONTRAST TECHNIQUE: Multidetector CT imaging of the abdomen and pelvis was performed following the standard protocol without IV contrast. COMPARISON:  None. FINDINGS: Lower chest: No acute abnormality. Hepatobiliary: Mild fatty infiltration of the liver without significant focal lesion. The gallbladder and bile ducts appear unremarkable. Pancreas: Unremarkable. No pancreatic ductal dilatation or surrounding inflammatory changes. Spleen: Normal in size without focal abnormality. Adrenals/Urinary Tract: Both adrenals are normal. There are collecting system calculi on the left, measuring up to 5 x 8 mm. No ureteral calculi. No hydronephrosis.  Unremarkable urinary bladder. Stomach/Bowel: Small hiatal hernia. Extensive colonic diverticulosis. Normal appendix. Normal small bowel. No acute inflammation of bowel. No bowel obstruction or perforation. Vascular/Lymphatic: Aortic atherosclerosis. No enlarged abdominal or pelvic lymph nodes. Reproductive: Uterus and bilateral adnexa are unremarkable. Other: No acute inflammation. No ascites. Tiny fat containing umbilical hernia. Musculoskeletal: Several Schmorl's nodes. No significant skeletal lesion. Moderate lower lumbar facet arthritis. IMPRESSION: 1. Left nephrolithiasis, nonobstructing. 2. Hepatic steatosis. 3. Small hiatal hernia. 4. Colonic diverticulosis. 5. No acute inflammatory changes. Electronically Signed   By: Ellery Plunk M.D.   On: 03/11/2016 00:18   Ct Angio Chest/abd/pel For Dissection W And/or Wo Contrast  Result Date: 03/11/2016 CLINICAL DATA:  Left flank and abdominal pain. EXAM: CT ANGIOGRAPHY CHEST, ABDOMEN AND PELVIS TECHNIQUE: Multidetector CT imaging through the chest, abdomen and pelvis was performed using the standard protocol during bolus administration of intravenous contrast. Multiplanar reconstructed images and MIPs were obtained and reviewed to evaluate the vascular anatomy. CONTRAST:  100 cc Isovue 370 IV COMPARISON:  Noncontrast CT abdomen/pelvis 2 hours prior. FINDINGS: CTA CHEST FINDINGS Cardiovascular: No aortic dissection, aneurysm, hematoma or acute aortic syndrome. Mild atherosclerosis. Coronary artery calcifications are seen. Normal heart size. No pericardial effusion. Mediastinum/Nodes: No mediastinal or hilar adenopathy. No axillary adenopathy. Visualized thyroid gland is normal. Small hiatal hernia. Lungs/Pleura: No consolidation or pleural fluid. No pulmonary nodule. No evidence pulmonary edema. Musculoskeletal: There are no acute or suspicious osseous abnormalities. Review of the MIP images confirms the above findings. CTA ABDOMEN AND PELVIS FINDINGS VASCULAR  Aorta: Normal in caliber with mild atherosclerosis. No dissection or aneurysm. Celiac: Patent without evidence of aneurysm, dissection, vasculitis or significant stenosis. SMA: Patent without evidence of aneurysm, dissection, vasculitis or significant stenosis. Renals: Both renal arteries are patent without evidence of aneurysm, dissection, vasculitis, fibromuscular dysplasia or significant stenosis. IMA: Patent without evidence of aneurysm, dissection, vasculitis or significant stenosis. Inflow: Atherosclerotic calcifications without dissection, significant stenosis or evidence of vasculitis. Veins: No obvious venous abnormality within the limitations of this arterial phase study. Review of the MIP images confirms the above findings. NON-VASCULAR Hepatobiliary: Hepatic steatosis  is demonstrated on noncontrast exam. Gallbladder physiologically distended, no calcified stone. No biliary dilatation. Pancreas: No ductal dilatation or inflammation. Spleen: Normal arterial phase enhancement. Adrenals/Urinary Tract: No adrenal nodule. Nonobstructing left nephrolithiasis. No hydronephrosis or perinephric edema. Urinary bladder is physiologically distended without wall thickening. Stomach/Bowel: Colonic diverticulosis without acute inflammation. No evidence of bowel inflammation. Lymphatic: No adenopathy. Reproductive: Uterus and bilateral adnexa are unremarkable. Other: No free air, free fluid, or intra-abdominal fluid collection. Tiny fat containing umbilical hernia. Musculoskeletal: Stable from prior.  Multiple Schmorl's nodes. Review of the MIP images confirms the above findings. IMPRESSION: 1. No aortic dissection or acute aortic abnormality. Mild thoracoabdominal atherosclerosis. 2. No acute abnormality in the chest, abdomen, or pelvis. Electronically Signed   By: Rubye OaksMelanie  Ehinger M.D.   On: 03/11/2016 01:51    Procedures Procedures (including critical care time)  Medications Ordered in ED Medications    HYDROmorphone (DILAUDID) injection 1 mg (1 mg Intravenous Given 03/10/16 2314)  ondansetron (ZOFRAN) injection 4 mg (4 mg Intravenous Given 03/10/16 2315)  sodium chloride 0.9 % bolus 1,000 mL (0 mLs Intravenous Stopped 03/11/16 0230)  iopamidol (ISOVUE-370) 76 % injection 100 mL (100 mLs Intravenous Contrast Given 03/11/16 0122)  morphine 4 MG/ML injection 4 mg (4 mg Intravenous Given 03/11/16 0234)  ondansetron (ZOFRAN) injection 4 mg (4 mg Intravenous Given 03/11/16 0234)  metoCLOPramide (REGLAN) injection 10 mg (10 mg Intravenous Given 03/11/16 0430)   Emergency Ultrasound:  US Guidance for needle guidance  Performed by Dr. Dalene SeltzerSchlossman Indication: needs IV access  Linear probe used in real-time to visualize location of needle entry through skin right arm Interpretation: successful placement Image not archived electronically.   IV working appropriately initially however in bend in arm, on reevaluation pt has had extravasation of IV saline  Second IV placed in left arm under US guidance using linear probe with successful placement  Initial Impression / Assessment and Plan / ED Course  I have reviewed the triage vital signs and the nursing notes.  Pertinent labs & imaging results that were available during my care of the patient were reviewed by me and considered in my medical decision making (see chart for details).     61yo female presents with sudden onset acute left flank and abdominal pain. DDx includes nephrolithiasis, diverticulitis, dissection, MSK.  Stone study neg for obstruction. Given significant presntation will perform vascular study CT angio. Pt iwht hypoxia after pain medications which likely secondary to sleep apnea, however will obtain CT of C/A/P.  Labs without acute findings other than lactic acid elevation. Will give IV fluids and recheck. Signed out to Dr. Elesa MassedWard with studies pending.  Final Clinical Impressions(s) / ED Diagnoses   Final diagnoses:  Left flank pain     New Prescriptions Discharge Medication List as of 03/11/2016  4:34 AM    START taking these medications   Details  HYDROcodone-acetaminophen (NORCO/VICODIN) 5-325 MG tablet Take 1-2 tablets by mouth every 6 (six) hours as needed., Starting Mon 03/11/2016, Print    ondansetron (ZOFRAN ODT) 4 MG disintegrating tablet Take 1 tablet (4 mg total) by mouth every 8 (eight) hours as needed for nausea or vomiting., Starting Mon 03/11/2016, Print       I personally performed the services described in this documentation, which was scribed in my presence. The recorded information has been reviewed and is accurate.     Sheryl MondayErin Shaleta Ruacho, MD 03/11/16 [phone]

## 2016-03-10 NOTE — ED Notes (Signed)
MD aware that pt has had no relief from dilaudid. Rates pain 10/10

## 2016-03-11 LAB — URINALYSIS, ROUTINE W REFLEX MICROSCOPIC
Bilirubin Urine: NEGATIVE
Glucose, UA: NEGATIVE mg/dL
Ketones, ur: 15 mg/dL — AB
Leukocytes, UA: NEGATIVE
Nitrite: NEGATIVE
Protein, ur: NEGATIVE mg/dL
Specific Gravity, Urine: >1.046 — ABNORMAL HIGH (ref 1.005–1.030)
pH: 6 (ref 5.0–8.0)

## 2016-03-11 LAB — I-STAT CG4 LACTIC ACID, ED: Lactic Acid, Venous: 1.67 mmol/L (ref 0.5–1.9)

## 2016-03-11 LAB — TROPONIN I: Troponin I: <0.03 ng/mL (ref <0.03)

## 2016-03-11 LAB — URINALYSIS, MICROSCOPIC (REFLEX)

## 2016-03-11 LAB — LIPASE, BLOOD: Lipase: 26 U/L (ref 11–51)

## 2016-03-11 MED ORDER — IOPAMIDOL (ISOVUE-370) INJECTION 76%
100.0000 mL | Freq: Once | INTRAVENOUS | Status: AC | PRN
  Administered 2016-03-11: 100 mL via INTRAVENOUS

## 2016-03-11 MED ORDER — ONDANSETRON HCL 4 MG/2ML IJ SOLN
4.0000 mg | Freq: Once | INTRAMUSCULAR | Status: AC
  Administered 2016-03-11: 4 mg via INTRAVENOUS
  Filled 2016-03-11: qty 2

## 2016-03-11 MED ORDER — SODIUM CHLORIDE 0.9 % IV BOLUS (SEPSIS)
1000.0000 mL | Freq: Once | INTRAVENOUS | Status: AC
  Administered 2016-03-11: 1000 mL via INTRAVENOUS

## 2016-03-11 MED ORDER — ONDANSETRON 4 MG PO TBDP: 4.0000 mg | ORAL_TABLET | Freq: Three times a day (TID) | ORAL | 0 refills | Status: DC | PRN

## 2016-03-11 MED ORDER — MORPHINE SULFATE (PF) 4 MG/ML IV SOLN
4.0000 mg | Freq: Once | INTRAVENOUS | Status: AC
  Administered 2016-03-11: 4 mg via INTRAVENOUS
  Filled 2016-03-11: qty 1

## 2016-03-11 MED ORDER — HYDROCODONE-ACETAMINOPHEN 5-325 MG PO TABS: 1.0000 | ORAL_TABLET | Freq: Four times a day (QID) | ORAL | 0 refills | Status: DC | PRN

## 2016-03-11 MED ORDER — MORPHINE SULFATE (PF) 4 MG/ML IV SOLN
4.0000 mg | Freq: Once | INTRAVENOUS | Status: DC
  Filled 2016-03-11: qty 1

## 2016-03-11 MED ORDER — METOCLOPRAMIDE HCL 5 MG/ML IJ SOLN
10.0000 mg | Freq: Once | INTRAMUSCULAR | Status: AC
Start: 2016-03-11 — End: 2016-03-11
  Administered 2016-03-11: 10 mg via INTRAVENOUS
  Filled 2016-03-11: qty 2

## 2016-03-11 NOTE — ED Notes (Addendum)
Oxygen saturation 86-88%. Pt sleepy but and talking, in no distress. Oxygen applied via [place] @2L . Oxygen saturation 98%. EDP notified.

## 2016-03-11 NOTE — ED Notes (Addendum)
MD aware that pt is having mild nausea again and her flank pain is returning. No orders at present

## 2016-03-11 NOTE — ED Notes (Signed)
Pt oxygen removed to trial room air.

## 2016-03-11 NOTE — ED Provider Notes (Addendum)
3:00 AM  Assumed care from Dr. Dalene SeltzerSchlossman.  Pt is a 61 y.o. female who had presented to the emergency department with left flank and left abdominal pain that started last night. No known injury. No numbness, tingling or focal weakness. No chest pain or shortness of breath. Patient's labs have been unremarkable other than mildly elevated lactate. CT without contrast showed left-sided nephrolithiasis without ureterolithiasis, hydronephrosis. CT angio obtained to rule out dissection. A CT was negative for aneurysm or dissection. Patient did have a brief episode of hypoxia likely from narcotics. Pain has been well-controlled in the emergency department. Plan was to repeat lactate after IV fluids, repeat troponin. We'll also aim for a patient with pulse oximetry to see how she does walking and to make sure her oxygen saturation does not drop. CT of her chest did not show any acute abnormality including pneumonia, edema, pneumothorax.   3:50 AM  Pt's second troponin is negative. Lipase normal. Lactate improved. Able to ambulate without assistance. Oxygen saturation stays above 95% off oxygen while walking. Has some left-sided back pain with movement and nausea. This may be musculoskeletal in nature. Repeat EKG shows no ischemic abnormality. Urinalysis pending. Will give another round of pain and nausea medicine and fluid challenge patient in the ED.  4:30 AM  Pt is very drowsy after pain medication. This is causing her to be hypoxic when she falls asleep but comes back up quickly when she wakes up and takes a deep breath. Possible history of sleep apnea as well. We'll continue to monitor her until she is more awake. We'll hold further narcotics. Urine showed no sign of infection. She is drinking without difficulty. I feel once she is more awake she can be discharged home with prescription for Vicodin close PCP follow-up. Patient and husband are comfortable with this plan. Discussed return precautions.  She has been  intermittently bradycardic but the lowest I have seen her rate is at 48 and she quickly comes back up into the 50s. Again denies any chest pain or shortness of breath. Patient is on Lopressor which is likely the cause of this.   At this time, I do not feel there is any life-threatening condition present. I have reviewed and discussed all results (EKG, imaging, lab, urine as appropriate) and exam findings with patient/family. I have reviewed nursing notes and appropriate previous records.  I feel the patient is safe to be discharged home without further emergent workup and can continue workup as an outpatient as needed. Discussed usual and customary return precautions. Patient/family verbalize understanding and are comfortable with this plan.  Outpatient follow-up has been provided. All questions have been answered.    EKG Interpretation  Date/Time:  Sunday March 10 2016 22:29:35 EST Ventricular Rate:  64 PR Interval:    QRS Duration: 95 QT Interval:  490 QTC Calculation: 478 R Axis:   -5 Text Interpretation:  Sinus rhythm Multiple premature complexes, vent & supraven Borderline T wave abnormalities No previous ECGs available Confirmed by [facility] HendersonCHLOSSMAN MD, ERIN ([id number]) on 03/11/2016 12:43:45 AM         EKG Interpretation  Date/Time:  Monday March 11 2016 03:40:36 EST Ventricular Rate:  60 PR Interval:    QRS Duration: 99 QT Interval:  465 QTC Calculation: 465 R Axis:   -3 Text Interpretation:  Sinus rhythm Confirmed by WARD,  DO, KRISTEN ([id number]) on 03/11/2016 3:53:03 AM         Layla MawKristen N Ward, DO 03/11/16 [id number]    Layla MawKristen N  Ward, DO 03/11/16 [id number]

## 2016-03-11 NOTE — Progress Notes (Signed)
Pt ambulated with sats maintaining 95-97%. Pt states her nausea and pain have returned with ambulation. MD notified.

## 2016-03-12 LAB — URINE CULTURE: Culture: <10000 — AB

## 2016-03-13 ENCOUNTER — Encounter (HOSPITAL_COMMUNITY): Payer: Self-pay

## 2016-03-13 ENCOUNTER — Emergency Department (HOSPITAL_COMMUNITY): Payer: BLUE CROSS/BLUE SHIELD

## 2016-03-13 ENCOUNTER — Inpatient Hospital Stay (HOSPITAL_COMMUNITY)
Admission: EM | Admit: 2016-03-13 | Discharge: 2016-03-22 | DRG: 552 | Disposition: A | Discharge status: Home / Self Care | Payer: BLUE CROSS/BLUE SHIELD | Attending: Internal Medicine | Admitting: Internal Medicine

## 2016-03-13 DIAGNOSIS — E860 Dehydration: Secondary | ICD-10-CM | POA: Diagnosis present

## 2016-03-13 DIAGNOSIS — M4726 Other spondylosis with radiculopathy, lumbar region: Secondary | ICD-10-CM | POA: Diagnosis not present

## 2016-03-13 DIAGNOSIS — E785 Hyperlipidemia, unspecified: Secondary | ICD-10-CM | POA: Diagnosis present

## 2016-03-13 DIAGNOSIS — M545 Low back pain: Secondary | ICD-10-CM | POA: Diagnosis not present

## 2016-03-13 DIAGNOSIS — R519 Headache, unspecified: Secondary | ICD-10-CM

## 2016-03-13 DIAGNOSIS — N200 Calculus of kidney: Secondary | ICD-10-CM | POA: Diagnosis present

## 2016-03-13 DIAGNOSIS — M47816 Spondylosis without myelopathy or radiculopathy, lumbar region: Secondary | ICD-10-CM

## 2016-03-13 DIAGNOSIS — I10 Essential (primary) hypertension: Secondary | ICD-10-CM | POA: Diagnosis present

## 2016-03-13 DIAGNOSIS — K760 Fatty (change of) liver, not elsewhere classified: Secondary | ICD-10-CM | POA: Diagnosis present

## 2016-03-13 DIAGNOSIS — R51 Headache: Secondary | ICD-10-CM

## 2016-03-13 DIAGNOSIS — Z888 Allergy status to other drugs, medicaments and biological substances status: Secondary | ICD-10-CM

## 2016-03-13 DIAGNOSIS — R7989 Other specified abnormal findings of blood chemistry: Secondary | ICD-10-CM | POA: Diagnosis present

## 2016-03-13 DIAGNOSIS — Z882 Allergy status to sulfonamides status: Secondary | ICD-10-CM

## 2016-03-13 DIAGNOSIS — M4646 Discitis, unspecified, lumbar region: Secondary | ICD-10-CM | POA: Diagnosis present

## 2016-03-13 DIAGNOSIS — Z7982 Long term (current) use of aspirin: Secondary | ICD-10-CM

## 2016-03-13 DIAGNOSIS — Z87891 Personal history of nicotine dependence: Secondary | ICD-10-CM

## 2016-03-13 DIAGNOSIS — E876 Hypokalemia: Secondary | ICD-10-CM | POA: Diagnosis present

## 2016-03-13 DIAGNOSIS — R112 Nausea with vomiting, unspecified: Secondary | ICD-10-CM | POA: Diagnosis present

## 2016-03-13 DIAGNOSIS — M549 Dorsalgia, unspecified: Secondary | ICD-10-CM | POA: Diagnosis present

## 2016-03-13 DIAGNOSIS — Z8249 Family history of ischemic heart disease and other diseases of the circulatory system: Secondary | ICD-10-CM

## 2016-03-13 HISTORY — DX: Calculus of kidney: N20.0

## 2016-03-13 LAB — CBC WITH DIFFERENTIAL/PLATELET
Basophils Absolute: 0 10*3/uL (ref 0.0–0.1)
Basophils Relative: 0 %
Eosinophils Absolute: 0 10*3/uL (ref 0.0–0.7)
Eosinophils Relative: 0 %
HCT: 41.5 % (ref 36.0–46.0)
Hemoglobin: 14.3 g/dL (ref 12.0–15.0)
Lymphocytes Relative: 11 %
Lymphs Abs: 1 10*3/uL (ref 0.7–4.0)
MCH: 30.7 pg (ref 26.0–34.0)
MCHC: 34.5 g/dL (ref 30.0–36.0)
MCV: 89.1 fL (ref 78.0–100.0)
Monocytes Absolute: 0.6 10*3/uL (ref 0.1–1.0)
Monocytes Relative: 7 %
Neutro Abs: 7.2 10*3/uL (ref 1.7–7.7)
Neutrophils Relative %: 82 %
Platelets: 174 10*3/uL (ref 150–400)
RBC: 4.66 MIL/uL (ref 3.87–5.11)
RDW: 11.9 % (ref 11.5–15.5)
WBC: 8.9 10*3/uL (ref 4.0–10.5)

## 2016-03-13 LAB — MAGNESIUM: Magnesium: 2.3 mg/dL (ref 1.7–2.4)

## 2016-03-13 LAB — COMPREHENSIVE METABOLIC PANEL
ALT: 32 U/L (ref 14–54)
AST: 24 U/L (ref 15–41)
Albumin: 4 g/dL (ref 3.5–5.0)
Alkaline Phosphatase: 54 U/L (ref 38–126)
Anion gap: 9 (ref 5–15)
BUN: 12 mg/dL (ref 6–20)
CO2: 27 mmol/L (ref 22–32)
Calcium: 8.5 mg/dL — ABNORMAL LOW (ref 8.9–10.3)
Chloride: 99 mmol/L — ABNORMAL LOW (ref 101–111)
Creatinine, Ser: 0.77 mg/dL (ref 0.44–1.00)
GFR calc Af Amer: >60 mL/min (ref >60)
GFR calc non Af Amer: >60 mL/min (ref >60)
Glucose, Bld: 113 mg/dL — ABNORMAL HIGH (ref 65–99)
Potassium: 3.2 mmol/L — ABNORMAL LOW (ref 3.5–5.1)
Sodium: 135 mmol/L (ref 135–145)
Total Bilirubin: 1.2 mg/dL (ref 0.3–1.2)
Total Protein: 6.4 g/dL — ABNORMAL LOW (ref 6.5–8.1)

## 2016-03-13 LAB — URINALYSIS, ROUTINE W REFLEX MICROSCOPIC
Bilirubin Urine: NEGATIVE
Glucose, UA: NEGATIVE mg/dL
Ketones, ur: 80 mg/dL — AB
Nitrite: NEGATIVE
Protein, ur: 30 mg/dL — AB
Specific Gravity, Urine: 1.014 (ref 1.005–1.030)
pH: 7 (ref 5.0–8.0)

## 2016-03-13 LAB — INFLUENZA PANEL BY PCR (TYPE A & B)
Influenza A By PCR: NEGATIVE
Influenza B By PCR: NEGATIVE

## 2016-03-13 LAB — I-STAT CHEM 8, ED
BUN: 11 mg/dL (ref 6–20)
Calcium, Ion: 1.09 mmol/L — ABNORMAL LOW (ref 1.15–1.40)
Chloride: 97 mmol/L — ABNORMAL LOW (ref 101–111)
Creatinine, Ser: 0.8 mg/dL (ref 0.44–1.00)
Glucose, Bld: 113 mg/dL — ABNORMAL HIGH (ref 65–99)
HCT: 44 % (ref 36.0–46.0)
Hemoglobin: 15 g/dL (ref 12.0–15.0)
Potassium: 3.3 mmol/L — ABNORMAL LOW (ref 3.5–5.1)
Sodium: 137 mmol/L (ref 135–145)
TCO2: 26 mmol/L (ref 0–100)

## 2016-03-13 LAB — I-STAT CG4 LACTIC ACID, ED: Lactic Acid, Venous: 0.83 mmol/L (ref 0.5–1.9)

## 2016-03-13 LAB — PHOSPHORUS: Phosphorus: 2.1 mg/dL — ABNORMAL LOW (ref 2.5–4.6)

## 2016-03-13 LAB — LIPASE, BLOOD: Lipase: 26 U/L (ref 11–51)

## 2016-03-13 MED ORDER — ONDANSETRON HCL 4 MG/2ML IJ SOLN
4.0000 mg | Freq: Once | INTRAMUSCULAR | Status: AC
  Administered 2016-03-13: 4 mg via INTRAVENOUS
  Filled 2016-03-13: qty 2

## 2016-03-13 MED ORDER — SODIUM CHLORIDE 0.9 % IV BOLUS (SEPSIS)
1000.0000 mL | Freq: Once | INTRAVENOUS | Status: AC
  Administered 2016-03-13: 1000 mL via INTRAVENOUS

## 2016-03-13 MED ORDER — MORPHINE SULFATE (PF) 2 MG/ML IV SOLN
2.0000 mg | Freq: Once | INTRAVENOUS | Status: AC
Start: 2016-03-13 — End: 2016-03-13
  Administered 2016-03-13: 2 mg via INTRAVENOUS
  Filled 2016-03-13: qty 1

## 2016-03-13 MED ORDER — MORPHINE SULFATE (PF) 4 MG/ML IV SOLN
4.0000 mg | Freq: Once | INTRAVENOUS | Status: AC
  Administered 2016-03-13: 4 mg via INTRAVENOUS
  Filled 2016-03-13: qty 1

## 2016-03-13 MED ORDER — GADOBENATE DIMEGLUMINE 529 MG/ML IV SOLN
20.0000 mL | Freq: Once | INTRAVENOUS | Status: AC | PRN
  Administered 2016-03-13: 18 mL via INTRAVENOUS

## 2016-03-13 MED ORDER — POTASSIUM CHLORIDE CRYS ER 20 MEQ PO TBCR
40.0000 meq | EXTENDED_RELEASE_TABLET | Freq: Once | ORAL | Status: AC
  Administered 2016-03-14: 40 meq via ORAL
  Filled 2016-03-13: qty 2

## 2016-03-13 MED ORDER — LORAZEPAM 2 MG/ML IJ SOLN
0.5000 mg | Freq: Once | INTRAMUSCULAR | Status: AC
  Administered 2016-03-13: 0.5 mg via INTRAVENOUS
  Filled 2016-03-13: qty 1

## 2016-03-13 NOTE — H&P (Signed)
Sheryl Bates ZOX:[id number] DOB: March 24, 1955 DOA: 03/13/2016     PCP: [name], MD   Outpatient Specialists: Cardiology Maisie Fus   Patient coming from:   home Lives With family    Chief Complaint: Back pain and nausea vomiting  HPI: Sheryl Bates is a 61 y.o. female with medical history significant of HTN endometriosis HLD    Presented with 3d  for day history of severe left flank back/abdominal pain which has been constant. Associated with nausea and started to have vomiting as well. Pain started suddenly more in her left back that radiated to the left abdomen worse with movement she have had initially some diarrhea. Of note about a week ago patient had URI symptoms and was treated with steroids he have not had any dysuria or hematuria, Did not had any associated  shortness of breath or chest pain. She feels the pain was initially left flank and now more lower left back, minimal central pain. Worse with movement and entle palpation. She has never had this kind of pain.  At the onset on 28th of January patient has been seen for this in emergency department.  On 28th of January renal protocol CT was done showed left nephrolithiasis which was nonobstructing hepatic steatosis. In severity of pain she has undergone CTA of her chest abdomen and pelvis was negative for dissection any acute abnormality. Patient went home but continues to have pain worse with ambulation she called EMS and came back to emerge department today. The pain has been persistent MSK 50 g of fentanyl and 4 mg of Zofran. She had not had any radiation down her legs still able to ambulate although with some pain. Pain is worse with palpation. For nausea and vomiting has been severe and she is unable to tolerate anything by mouth. She thinks that she have had some fevers and chills. He is not taking any home medications for her symptoms.  Patient has no history of diabetes she has remote history of  endometriosis.   IN ER:  Temp (24hrs), Avg:97.9 F (36.6 C), Min:97.5 F (36.4 C), Max:98.2 F (36.8 C)      RR 18 oxygen 92 HR 62 BP 152/77    sodium 137     K3.3 cr  0.8 calcium 1.09  Lipase 26 MRI done today: Showing L2-3 central disc in enhancement but no significant disc edema or soft tissue edema making infection somewhat less likely  Following Medications were ordered in ER: Medications  sodium chloride 0.9 % bolus 1,000 mL (0 mLs Intravenous Stopped 03/13/16 1405)  morphine 4 MG/ML injection 4 mg (4 mg Intravenous Given 03/13/16 1047)  morphine 4 MG/ML injection 4 mg (4 mg Intravenous Given 03/13/16 1404)  ondansetron (ZOFRAN) injection 4 mg (4 mg Intravenous Given 03/13/16 1404)  LORazepam (ATIVAN) injection 0.5 mg (0.5 mg Intravenous Given 03/13/16 1549)  sodium chloride 0.9 % bolus 1,000 mL (1,000 mLs Intravenous New Bag/Given 03/13/16 1804)  gadobenate dimeglumine (MULTIHANCE) injection 20 mL (18 mLs Intravenous Contrast Given 03/13/16 1658)     ER provider discussed case with: Neurosurgery who recommends obtaining sedimentation rate and CRP and if elevated may need to further evaluation for possible infectious process   Hospitalist was called for admission for dehydration intractable nausea vomiting back pain with questionable discitis   Review of Systems:    Pertinent positives include: Fevers, chills, back pain  Constitutional:  No weight loss, night sweats,  fatigue, weight loss  HEENT:  No headaches, Difficulty swallowing,Tooth/dental  problems,Sore throat,  No sneezing, itching, ear ache, nasal congestion, post nasal drip,  Cardio-vascular:  No chest pain, Orthopnea, PND, anasarca, dizziness, palpitations.no Bilateral lower extremity swelling  GI:  No heartburn, indigestion, abdominal pain, nausea, vomiting, diarrhea, change in bowel habits, loss of appetite, melena, blood in stool, hematemesis Resp:  no shortness of breath at rest. No dyspnea on exertion, No  excess mucus, no productive cough, No non-productive cough, No coughing up of blood.No change in color of mucus.No wheezing. Skin:  no rash or lesions. No jaundice GU:  no dysuria, change in color of urine, no urgency or frequency. No straining to urinate.  No flank pain.  Musculoskeletal:  No joint pain or no joint swelling. No decreased range of motion. No back pain.  Psych:  No change in mood or affect. No depression or anxiety. No memory loss.  Neuro: no localizing neurological complaints, no tingling, no weakness, no double vision, no gait abnormality, no slurred speech, no confusion  As per HPI otherwise 10 point review of systems negative.   Past Medical History: Past Medical History:  Diagnosis Date  . Allergy   . CIN I (cervical intraepithelial neoplasia I)   . Endometriosis   . Hyperlipidemia   . Hypertension   . Kidney stone   . Ovarian cyst    right  . Vitamin D deficiency    Past Surgical History:  Procedure Laterality Date  . CERVICAL BIOPSY  W/ LOOP ELECTRODE EXCISION    . COLPOSCOPY    . KNEE SURGERY     Right  . LAPAROSCOPY  1996  . LEEP  1992  . PELVIC LAPAROSCOPY       Social History:  Ambulatory  Independently     reports that she quit smoking about 10 years ago. She has never used smokeless tobacco. She reports that she does not drink alcohol or use drugs.  Allergies:   Allergies  Allergen Reactions  . Sulfonamide Derivatives Diarrhea and Nausea Only  . Lisinopril Cough       Family History:   Family History  Problem Relation Age of Onset  . Hypertension Mother   . Heart disease Mother   . Diabetes Mother   . Heart disease Father 63    CHF    Medications: Prior to Admission medications   Medication Sig Start Date End Date Taking? Authorizing Provider  aspirin EC 81 MG tablet Take 81 mg by mouth daily.   Yes Historical Provider, MD  Cholecalciferol (VITAMIN D PO) Take 2 capsules by mouth daily with breakfast.   Yes Historical  Provider, MD  citalopram (CELEXA) 20 MG tablet Take 1 tablet (20 mg total) by mouth daily. 11/24/13  Yes [name], MD  hydrochlorothiazide (HYDRODIURIL) 25 MG tablet TAKE ONE TABLET BY MOUTH ONCE DAILY 08/10/14  Yes [name], MD  HYDROcodone-acetaminophen (NORCO/VICODIN) 5-325 MG tablet Take 1-2 tablets by mouth every 6 (six) hours as needed. Patient taking differently: Take 1-2 tablets by mouth every 6 (six) hours as needed for moderate pain.  03/11/16  Yes [name], DO  losartan (COZAAR) 100 MG tablet Take 1 tablet (100 mg total) by mouth daily. TAKE ONE TABLET BY MOUTH ONE TIME DAILY Patient taking differently: Take 100 mg by mouth daily.  07/19/13  Yes [name], MD  metoprolol tartrate (LOPRESSOR) 50 MG tablet Take 1 tablet (50 mg total) by mouth 2 (two) times daily. Patient taking differently: Take 50 mg by mouth daily with breakfast.  07/19/13  Yes [name], MD  ondansetron (ZOFRAN ODT) 4 MG disintegrating tablet Take 1 tablet (4 mg total) by mouth every 8 (eight) hours as needed for nausea or vomiting. 03/11/16  Yes [name], DO    Physical Exam: Patient Vitals for the past 24 hrs:  BP Temp Temp src Pulse Resp SpO2  03/13/16 1811 171/71 98.2 F (36.8 C) Oral 63 18 93 %  03/13/16 1451 189/78 - - 62 15 91 %  03/13/16 1246 154/66 - - (!) 58 15 90 %  03/13/16 1015 - - - 63 - 94 %  03/13/16 1000 197/69 - - (!) 59 - 92 %  03/13/16 0945 - - - 65 - 97 %  03/13/16 0930 189/82 - - 61 - 92 %  03/13/16 0929 198/77 97.5 F (36.4 C) Oral 65 15 97 %    1. General:  in No Acute distress 2. Psychological: Alert and  Oriented 3. Head/ENT:   Dry Mucous Membranes                          Head Non traumatic, neck supple                           Poor Dentition 4. SKIN:   decreased Skin turgor,  Skin clean Dry and intact no rash 5. Heart: Regular rate and rhythm no Murmur, Rub or gallop 6. Lungs Clear to auscultation bilaterally, no wheezes or crackles   7. Abdomen:  Soft, non-tender, Non distended 8. Lower extremities: no clubbing, cyanosis, or edema 9. Neurologically   strength 5 out of 5 in all 4 extremities cranial nerves II through XII intact 10. MSK: Normal range of motion, left lower back and left flank tenderness reproducible by gentle palpation   body mass index is unknown because there is no height or weight on file.  Labs on Admission:   Labs on Admission: I have personally reviewed following labs and imaging studies  CBC:  Recent Labs Lab 03/10/16 2302 03/13/16 1044 03/13/16 1047  WBC 7.2 8.9  --   NEUTROABS 5.1 7.2  --   HGB 14.5 14.3 15.0  HCT 42.3 41.5 44.0  MCV 90.8 89.1  --   PLT 216 174  --    Basic Metabolic Panel:  Recent Labs Lab 03/10/16 2302 03/13/16 1044 03/13/16 1047  NA 136 135 137  K 3.5 3.2* 3.3*  CL 99* 99* 97*  CO2 25 27  --   GLUCOSE 168* 113* 113*  BUN 16 12 11   CREATININE 0.85 0.77 0.80  CALCIUM 9.4 8.5*  --    GFR: Estimated Creatinine Clearance: 77.4 mL/min (by C-G formula based on SCr of 0.8 mg/dL). Liver Function Tests:  Recent Labs Lab 03/10/16 2302 03/13/16 1044  AST 36 24  ALT 43 32  ALKPHOS 59 54  BILITOT 0.6 1.2  PROT 7.0 6.4*  ALBUMIN 4.3 4.0    Recent Labs Lab 03/11/16 0320 03/13/16 1044  LIPASE 26 26   No results for input(s): AMMONIA in the last 168 hours. Coagulation Profile: No results for input(s): INR, PROTIME in the last 168 hours. Cardiac Enzymes:  Recent Labs Lab 03/10/16 2302 03/11/16 0225  TROPONINI <0.03 <0.03   BNP (last 3 results) No results for input(s): PROBNP in the last 8760 hours. HbA1C: No results for input(s): HGBA1C in the last 72 hours. CBG: No results for input(s): GLUCAP in the last  168 hours. Lipid Profile: No results for input(s): CHOL, HDL, LDLCALC, TRIG, CHOLHDL, LDLDIRECT in the last 72 hours. Thyroid Function Tests: No results for input(s): TSH, T4TOTAL, FREET4, T3FREE, THYROIDAB in the last 72 hours. Anemia Panel: No  results for input(s): VITAMINB12, FOLATE, FERRITIN, TIBC, IRON, RETICCTPCT in the last 72 hours. Urine analysis:    Component Value Date/Time   COLORURINE YELLOW 03/13/2016 0929   APPEARANCEUR CLOUDY (A) 03/13/2016 0929   LABSPEC 1.014 03/13/2016 0929   PHURINE 7.0 03/13/2016 0929   GLUCOSEU NEGATIVE 03/13/2016 0929   HGBUR SMALL (A) 03/13/2016 0929   BILIRUBINUR NEGATIVE 03/13/2016 0929   BILIRUBINUR n 07/19/2013 1200   KETONESUR 80 (A) 03/13/2016 0929   PROTEINUR 30 (A) 03/13/2016 0929   UROBILINOGEN 0.2 07/19/2013 1200   UROBILINOGEN 0.2 12/31/2011 1629   NITRITE NEGATIVE 03/13/2016 0929   LEUKOCYTESUR SMALL (A) 03/13/2016 0929   Sepsis Labs: @LABRCNTIP (procalcitonin:4,lacticidven:4) ) Recent Results (from the past 240 hour(s))  Urine culture     Status: Abnormal   Collection Time: 03/11/16  3:45 AM  Result Value Ref Range Status   Specimen Description URINE, RANDOM  Final   Special Requests NONE  Final   Culture (A)  Final    <10,000 COLONIES/mL INSIGNIFICANT GROWTH Performed at [facility], [address] [place], [place] [address]    Report Status 03/12/2016 FINAL  Final     UA no evidence of UTI  No results found for: HGBA1C  Estimated Creatinine Clearance: 77.4 mL/min (by C-G formula based on SCr of 0.8 mg/dL).  BNP (last 3 results) No results for input(s): PROBNP in the last 8760 hours.   ECG REPORT  Independently reviewed Rate: 64  Rhythm: NSR ST&T Change: No acute ischemic changes   QTC 478  There were no vitals filed for this visit.   Cultures:    Component Value Date/Time   SDES URINE, RANDOM 03/11/2016 0345   SPECREQUEST NONE 03/11/2016 0345   CULT (A) 03/11/2016 0345    <10,000 COLONIES/mL INSIGNIFICANT GROWTH Performed at [facility], [address] [place], [place] [address]    REPTSTATUS 03/12/2016 FINAL 03/11/2016 0345     Radiological Exams on Admission: Mr Thoracic Spine W Wo Contrast  Result Date:  03/13/2016 CLINICAL DATA:  61 y/o F; sudden onset left-sided lower back pain. Concern for epidural abscess. EXAM: MRI THORACIC AND LUMBAR SPINE WITHOUT AND WITH CONTRAST TECHNIQUE: Multiplanar and multiecho pulse sequences of the thoracic and lumbar spine were obtained without and with intravenous contrast. CONTRAST:  18mL MULTIHANCE GADOBENATE DIMEGLUMINE 529 MG/ML IV SOLN COMPARISON:  CT chest, abdomen, and pelvis dated 03/11/2016. FINDINGS: MRI THORACIC SPINE FINDINGS Alignment:  Physiologic. Vertebrae: No fracture, evidence of discitis, or bone lesion. T8 inferior endplate nonenhancing Schmorl's node. Cord:  Normal signal and morphology. Paraspinal and other soft tissues: Negative. Disc levels: No significant disc displacement, foraminal narrowing, or canal stenosis. MRI LUMBAR SPINE FINDINGS Segmentation:  Standard. Alignment:  Physiologic. Vertebrae: There are enhancing central deformity within the an L2 superior endplate, L3 inferior end plate, at L4 superior endplate with edema and enhancement. At L2-3 there is central disc enhancement and edema in adjacent L2 and L3 endplates with enhancement. Conus medullaris: Extends to the L1-2 level and appears normal. Paraspinal and other soft tissues: No paravertebral inflammatory changes or enhancement. Disc levels: L1-2: Small disc bulge with mild foraminal narrowing. No significant canal stenosis. L2-3: Small disc bulge with mild foraminal and lateral recess narrowing. No significant canal stenosis. L3-4:  Small disc bulge with mild foraminal and lateral recess narrowing. No significant canal stenosis. L4-5: Small disc bulge and mild bilateral facet hypertrophy greater on the right. Mild foraminal and lateral recess narrowing. L5-S1: Small central protrusion and moderate right-greater-than-left facet hypertrophy. No significant foraminal narrowing and canal stenosis. No significant canal stenosis. 7 x 9 mm synovial cyst in right paraspinal muscles. IMPRESSION: 1.  Enhancing central and deformity within the L2 superior endplate, L3 inferior endplate, at L4 superior endplate with edema and enhancement. At L2-3 there is central disc enhancement extending into the L2 inferior endplate central deformity and enhanced throughout the adjacent L2 and L3 endplates. On prior CT these lesions have the appearance of Schmorl's nodes. Enhancement within the L2-3 intervertebral disc could represent infection but there is no significant disc edema or surrounding soft tissue edema and infection is considered less likely. 2. Mild lumbar spondylosis with multiple levels of mild foraminal narrowing. 3. Normal thoracic MRI.  No abnormal enhancement. Electronically Signed   By: Mitzi Hansen M.D.   On: 03/13/2016 18:08   Mr Lumbar Spine W Wo Contrast  Result Date: 03/13/2016 CLINICAL DATA:  61 y/o F; sudden onset left-sided lower back pain. Concern for epidural abscess. EXAM: MRI THORACIC AND LUMBAR SPINE WITHOUT AND WITH CONTRAST TECHNIQUE: Multiplanar and multiecho pulse sequences of the thoracic and lumbar spine were obtained without and with intravenous contrast. CONTRAST:  18mL MULTIHANCE GADOBENATE DIMEGLUMINE 529 MG/ML IV SOLN COMPARISON:  CT chest, abdomen, and pelvis dated 03/11/2016. FINDINGS: MRI THORACIC SPINE FINDINGS Alignment:  Physiologic. Vertebrae: No fracture, evidence of discitis, or bone lesion. T8 inferior endplate nonenhancing Schmorl's node. Cord:  Normal signal and morphology. Paraspinal and other soft tissues: Negative. Disc levels: No significant disc displacement, foraminal narrowing, or canal stenosis. MRI LUMBAR SPINE FINDINGS Segmentation:  Standard. Alignment:  Physiologic. Vertebrae: There are enhancing central deformity within the an L2 superior endplate, L3 inferior end plate, at L4 superior endplate with edema and enhancement. At L2-3 there is central disc enhancement and edema in adjacent L2 and L3 endplates with enhancement. Conus medullaris:  Extends to the L1-2 level and appears normal. Paraspinal and other soft tissues: No paravertebral inflammatory changes or enhancement. Disc levels: L1-2: Small disc bulge with mild foraminal narrowing. No significant canal stenosis. L2-3: Small disc bulge with mild foraminal and lateral recess narrowing. No significant canal stenosis. L3-4: Small disc bulge with mild foraminal and lateral recess narrowing. No significant canal stenosis. L4-5: Small disc bulge and mild bilateral facet hypertrophy greater on the right. Mild foraminal and lateral recess narrowing. L5-S1: Small central protrusion and moderate right-greater-than-left facet hypertrophy. No significant foraminal narrowing and canal stenosis. No significant canal stenosis. 7 x 9 mm synovial cyst in right paraspinal muscles. IMPRESSION: 1. Enhancing central and deformity within the L2 superior endplate, L3 inferior endplate, at L4 superior endplate with edema and enhancement. At L2-3 there is central disc enhancement extending into the L2 inferior endplate central deformity and enhanced throughout the adjacent L2 and L3 endplates. On prior CT these lesions have the appearance of Schmorl's nodes. Enhancement within the L2-3 intervertebral disc could represent infection but there is no significant disc edema or surrounding soft tissue edema and infection is considered less likely. 2. Mild lumbar spondylosis with multiple levels of mild foraminal narrowing. 3. Normal thoracic MRI.  No abnormal enhancement. Electronically Signed   By: Mitzi Hansen M.D.   On: 03/13/2016 18:08   Ct L-spine No Charge  Result Date: 03/13/2016 CLINICAL DATA:  Neck  pain, LEFT flank pain since Sunday EXAM: CT LUMBAR SPINE WITHOUT CONTRAST TECHNIQUE: Multidetector CT imaging of the lumbar spine was performed without intravenous contrast administration. Multiplanar CT image reconstructions were also generated. COMPARISON:  CT abdomen/pelvis 03/10/2016 FINDINGS:  Segmentation: Exam labeled with 5 non-rib-bearing lumbar vertebra. Alignment: Normal Vertebrae: Schmorl's nodes at the superior inferior endplates of L2 and L3 as well as the superior endplates of L4 and L5. Mild facet degenerative changes in lower lumbar spine. Probable discogenic sclerosis in the L2 and L3 vertebral bodies, minimally surrounding Schmorl's node at superior endplate L4 as well. Vertebral body heights maintained without fracture or bone destruction. SI joints preserved. Paraspinal and other soft tissues: Atherosclerotic calcification aorta. Nonobstructing lower pole LEFT renal calculus at margin of exam please refer to CT abdomen report. No other soft tissue abnormalities. Disc levels: Disc space narrowing at L1-L2, L2-L3, less at remaining lumbar levels. Diffusely bulging discs at throughout the lumbar region from L1-L2 through L5-S1 without definite focal disc herniation or neural compression. IMPRESSION: Degenerative disc and facet disease changes of the lumbar spine as above. Probable discogenic sclerosis at L2 and L3 vertebral bodies. No definite acute lumbar spine abnormalities. Nonobstructing LEFT renal calculus. Electronically Signed   By: Ulyses SouthwardMark  Boles M.D.   On: 03/13/2016 11:51   Ct Renal Stone Study  Result Date: 03/13/2016 CLINICAL DATA:  LEFT flank pain since Sunday, nausea, vomiting, hypertension EXAM: CT ABDOMEN AND PELVIS WITHOUT CONTRAST TECHNIQUE: Multidetector CT imaging of the abdomen and pelvis was performed following the standard protocol without IV contrast. Sagittal and coronal MPR images reconstructed from axial data set. Oral contrast not administered for this indication. COMPARISON:  03/10/2016 FINDINGS: Lower chest: Minimal dependent atelectasis Hepatobiliary: Fatty infiltration of liver. Gallbladder and liver otherwise unremarkable. Pancreas: Normal appearance Spleen: Normal appearance Adrenals/Urinary Tract: Thickened adrenal glands without discrete mass. Unremarkable  RIGHT kidney, RIGHT ureter in urinary bladder. Nonobstructing LEFT renal calculi largest 6 mm diameter. No LEFT hydronephrosis, LEFT hydroureter or LEFT ureteral calcification. Stomach/Bowel: Normal appendix. Diverticulosis of descending and sigmoid colon without pericolic inflammatory changes to suggest acute diverticulitis. Small hiatal hernia. Stomach and small bowel loops otherwise unremarkable. Vascular/Lymphatic: Atherosclerotic calcifications aorta and iliac arteries. Aorta normal caliber. No adenopathy. Reproductive: Unremarkable uterus and adnexa Other: No free air free fluid.  No hernia or inflammatory process Musculoskeletal: Schmorl's nodes lumbar spine at L2-L5. Mild sclerosis of the L2 and L3 vertebral bodies, see dedicated CT lumbar spine exam. IMPRESSION: Nonobstructing LEFT renal calculi without evidence of hydronephrosis or hydroureter. Hepatic steatosis. Distal colonic diverticulosis. Small hiatal hernia. Aortic atherosclerosis. Degenerative disc disease changes lumbar spine with mild sclerosis in the L2 and L3 vertebral bodies, please refer to dedicated CT lumbar spine report. Electronically Signed   By: Ulyses SouthwardMark  Boles M.D.   On: 03/13/2016 11:46    Chart has been reviewed    Assessment/Plan   61 y.o. female with medical history significant of HTN endometriosis HLD medically for intractable nausea and vomiting back pain with questionable discitis vs musculoskeletal vs neuropathic   Present on Admission: . Back pain - atypical pain for discitis pain is mainly in the left flank reproducible by very superficial mild palpation sometimes described as tingling. Would monitor for development of any rashes to suggest Shigles. Await results of sedimentation rate and CRP. If pain becomes more centralized a more typical for discitis of patient develops fevers consider further evaluation. Per neurosurgery suspicion would be high consider biopsy. Given no evidence of sepsis will hold off on  antibiotics at this point. She does have a nonobstructing renal stone in the renal pelvis. But given pain reproducible by superficial palpation this being unlikely the cause Would give Toradol to help with inflammation. If develops rash consistent with shingles will need to treat accordingly . Hyperlipidemia stable continue home medications . Hypertension stable continue home medications . Hypokalemia likely secondary to   vomiting will repeat and check magnesium levels . Dehydration likely secondary to nausea vomiting poor by mouth intake will rehydrate . Nausea & vomiting diarrhea extensive workup so no intra-abdominal abnormality diarrhea currently improved will continue to monitor nausea and vomiting may be possibly secondary to pain supportive management for now    Other plan as per orders.  DVT prophylaxis:  SCD    Code Status:  FULL CODE  as per patient    Family Communication:   Family not  at  Bedside    Disposition Plan:    To home once workup is complete and patient is stable                        Would benefit from PT/OT eval prior to DC ordered                                              Consults called: none  Admission status:   obs   Level of care        medical floor        I have spent a total of 56 min on this admission    Coltrane Tugwell 03/13/2016, 11:16 PM    [facility]  Pager [phone]   after 2 AM please page floor coverage PA If 7AM-7PM, please contact the day team taking care of the patient  [email or web address]  Password [email or web address]

## 2016-03-13 NOTE — ED Notes (Signed)
Bed: [id number] Expected date:  Expected time:  Means of arrival:  Comments: EMS- possible kidney stone

## 2016-03-13 NOTE — ED Triage Notes (Signed)
She c/o persistent left flank pain for which she was seen at [facility] this past Saturday. She is in no distress, having received 50 mcg of Fentanyl and 4mg  of Zofran IV en route.

## 2016-03-13 NOTE — ED Provider Notes (Signed)
Assumed care from Dr. Adela LankFloyd. Briefly, pt is a 61 yo here with ongoing back pain, flank pain in setting of recent illness. Labs show significant dehydration, o/w unremarkable. CT imaging neg. MR pending.  MRI shows lumbar EP and disc edema, likely traumatic/inflammatory, but also must consider infectious etiology. Pt has persistent nausea, vomiting. Will continue IVF, admit for dehydration, persistent n/v, and abnormal MR with c/f possible discitis/osteo.   Sheryl Pollackameron Lindzie Boxx, MD 03/14/16 [id number]

## 2016-03-13 NOTE — ED Notes (Signed)
Phlebotomy at bedside.

## 2016-03-13 NOTE — ED Provider Notes (Signed)
[facility]-EMERGENCY DEPT Provider Note   CSN: [id number] Arrival date & time: 03/13/16  [id number]     History   Chief Complaint Chief Complaint  Patient presents with  . Flank Pain    HPI [name] is a 61 y.o. female.  61 yo F with a chief complaint of left-sided low back pain. This been going on for about 4 days. Started with a upper respiratory infection and a sore throat. This was treated with antibiotics at an urgent care. Patient then had sudden onset left lower back pain. She went to [facility]. There she obtained a CT stone study and a CT angiogram that were both negative. She was discharged with likely muscular skeletal back pain. Since then patient has not been able to eat and drink due to nausea. Having subjective fevers and chills. Persistent low back pain worse with movement and palpation. Denies radiation down her legs denies rash. Denies abdominal tenderness. Denies dysuria. Denies hematuria.   The history is provided by the patient.  Flank Pain  This is a new problem. The current episode started 2 days ago. The problem occurs constantly. The problem has not changed since onset.Pertinent negatives include no chest pain, no headaches and no shortness of breath. Nothing aggravates the symptoms. Nothing relieves the symptoms. She has tried nothing for the symptoms. The treatment provided no relief.    Past Medical History:  Diagnosis Date  . Allergy   . CIN I (cervical intraepithelial neoplasia I)   . Endometriosis   . Hyperlipidemia   . Hypertension   . Kidney stone   . Ovarian cyst    right  . Vitamin D deficiency     Patient Active Problem List   Diagnosis Date Noted  . Kidney stone   . Hypertension   . Endometriosis   . CIN I (cervical intraepithelial neoplasia I)   . Ovarian cyst   . Vitamin D deficiency   . Hyperlipidemia     Past Surgical History:  Procedure Laterality Date  . CERVICAL BIOPSY  W/ LOOP ELECTRODE EXCISION    .  COLPOSCOPY    . KNEE SURGERY     Right  . LAPAROSCOPY  1996  . LEEP  1992  . PELVIC LAPAROSCOPY      OB History    Gravida Para Term Preterm AB Living   1       1 0   SAB TAB Ectopic Multiple Live Births                   Home Medications    Prior to Admission medications   Medication Sig Start Date End Date Taking? Authorizing Provider  aspirin EC 81 MG tablet Take 81 mg by mouth daily.   Yes Historical Provider, MD  Cholecalciferol (VITAMIN D PO) Take 2 capsules by mouth daily with breakfast.   Yes Historical Provider, MD  citalopram (CELEXA) 20 MG tablet Take 1 tablet (20 mg total) by mouth daily. 11/24/13  Yes Douglass Riversracy Lathrop, MD  hydrochlorothiazide (HYDRODIURIL) 25 MG tablet TAKE ONE TABLET BY MOUTH ONCE DAILY 08/10/14  Yes Lindley MagnusBruce H Swords, MD  HYDROcodone-acetaminophen (NORCO/VICODIN) 5-325 MG tablet Take 1-2 tablets by mouth every 6 (six) hours as needed. Patient taking differently: Take 1-2 tablets by mouth every 6 (six) hours as needed for moderate pain.  03/11/16  Yes [name], DO  losartan (COZAAR) 100 MG tablet Take 1 tablet (100 mg total) by mouth daily. TAKE ONE TABLET BY MOUTH  ONE TIME DAILY Patient taking differently: Take 100 mg by mouth daily.  07/19/13  Yes [name], MD  metoprolol tartrate (LOPRESSOR) 50 MG tablet Take 1 tablet (50 mg total) by mouth 2 (two) times daily. Patient taking differently: Take 50 mg by mouth daily with breakfast.  07/19/13  Yes [name], MD  ondansetron (ZOFRAN ODT) 4 MG disintegrating tablet Take 1 tablet (4 mg total) by mouth every 8 (eight) hours as needed for nausea or vomiting. 03/11/16  Yes [name], DO    Family History Family History  Problem Relation Age of Onset  . Hypertension Mother   . Heart disease Mother   . Diabetes Mother   . Heart disease Father 68    CHF    Social History Social History  Substance Use Topics  . Smoking status: Former Smoker    Quit date: 05/03/2005  . Smokeless tobacco:  Never Used  . Alcohol use No     Comment: rare     Allergies   Sulfonamide derivatives and Lisinopril   Review of Systems Review of Systems  Constitutional: Positive for chills and fever.  HENT: Negative for congestion and rhinorrhea.   Eyes: Negative for redness and visual disturbance.  Respiratory: Negative for shortness of breath and wheezing.   Cardiovascular: Negative for chest pain and palpitations.  Gastrointestinal: Positive for nausea. Negative for vomiting.  Genitourinary: Positive for flank pain. Negative for dysuria and urgency.  Musculoskeletal: Negative for arthralgias and myalgias.  Skin: Negative for pallor and wound.  Neurological: Negative for dizziness and headaches.     Physical Exam Updated Vital Signs BP 189/78   Pulse 62   Temp 97.5 F (36.4 C) (Oral)   Resp 15   SpO2 91%   Physical Exam  Constitutional: She is oriented to person, place, and time. She appears well-developed and well-nourished. No distress.  HENT:  Head: Normocephalic and atraumatic.  Eyes: EOM are normal. Pupils are equal, round, and reactive to light.  Neck: Normal range of motion. Neck supple.  Cardiovascular: Normal rate and regular rhythm.  Exam reveals no gallop and no friction rub.   No murmur heard. Pulmonary/Chest: Effort normal. She has no wheezes. She has no rales.  Abdominal: Soft. She exhibits no distension and no mass. There is no tenderness. There is no guarding.  Musculoskeletal: She exhibits tenderness (TTP about the left paraspinal musculature). She exhibits no edema.  Pulse motor and sensation is intact to the left lower extremity.  Neurological: She is alert and oriented to person, place, and time.  Skin: Skin is warm and dry. She is not diaphoretic.  Psychiatric: She has a normal mood and affect. Her behavior is normal.  Nursing note and vitals reviewed.    ED Treatments / Results  Labs (all labs ordered are listed, but only abnormal results are  displayed) Labs Reviewed  URINALYSIS, ROUTINE W REFLEX MICROSCOPIC - Abnormal; Notable for the following:       Result Value   APPearance CLOUDY (*)    Hgb urine dipstick SMALL (*)    Ketones, ur 80 (*)    Protein, ur 30 (*)    Leukocytes, UA SMALL (*)    Bacteria, UA RARE (*)    Squamous Epithelial / LPF 0-5 (*)    All other components within normal limits  COMPREHENSIVE METABOLIC PANEL - Abnormal; Notable for the following:    Potassium 3.2 (*)    Chloride 99 (*)    Glucose, Bld 113 (*)  Calcium 8.5 (*)    Total Protein 6.4 (*)    All other components within normal limits  I-STAT CHEM 8, ED - Abnormal; Notable for the following:    Potassium 3.3 (*)    Chloride 97 (*)    Glucose, Bld 113 (*)    Calcium, Ion 1.09 (*)    All other components within normal limits  CBC WITH DIFFERENTIAL/PLATELET  LIPASE, BLOOD    EKG  EKG Interpretation None       Radiology Ct L-spine No Charge  Result Date: 03/13/2016 CLINICAL DATA:  Neck pain, LEFT flank pain since Sunday EXAM: CT LUMBAR SPINE WITHOUT CONTRAST TECHNIQUE: Multidetector CT imaging of the lumbar spine was performed without intravenous contrast administration. Multiplanar CT image reconstructions were also generated. COMPARISON:  CT abdomen/pelvis 03/10/2016 FINDINGS: Segmentation: Exam labeled with 5 non-rib-bearing lumbar vertebra. Alignment: Normal Vertebrae: Schmorl's nodes at the superior inferior endplates of L2 and L3 as well as the superior endplates of L4 and L5. Mild facet degenerative changes in lower lumbar spine. Probable discogenic sclerosis in the L2 and L3 vertebral bodies, minimally surrounding Schmorl's node at superior endplate L4 as well. Vertebral body heights maintained without fracture or bone destruction. SI joints preserved. Paraspinal and other soft tissues: Atherosclerotic calcification aorta. Nonobstructing lower pole LEFT renal calculus at margin of exam please refer to CT abdomen report. No other  soft tissue abnormalities. Disc levels: Disc space narrowing at L1-L2, L2-L3, less at remaining lumbar levels. Diffusely bulging discs at throughout the lumbar region from L1-L2 through L5-S1 without definite focal disc herniation or neural compression. IMPRESSION: Degenerative disc and facet disease changes of the lumbar spine as above. Probable discogenic sclerosis at L2 and L3 vertebral bodies. No definite acute lumbar spine abnormalities. Nonobstructing LEFT renal calculus. Electronically Signed   By: Ulyses Southward M.D.   On: 03/13/2016 11:51   Ct Renal Stone Study  Result Date: 03/13/2016 CLINICAL DATA:  LEFT flank pain since Sunday, nausea, vomiting, hypertension EXAM: CT ABDOMEN AND PELVIS WITHOUT CONTRAST TECHNIQUE: Multidetector CT imaging of the abdomen and pelvis was performed following the standard protocol without IV contrast. Sagittal and coronal MPR images reconstructed from axial data set. Oral contrast not administered for this indication. COMPARISON:  03/10/2016 FINDINGS: Lower chest: Minimal dependent atelectasis Hepatobiliary: Fatty infiltration of liver. Gallbladder and liver otherwise unremarkable. Pancreas: Normal appearance Spleen: Normal appearance Adrenals/Urinary Tract: Thickened adrenal glands without discrete mass. Unremarkable RIGHT kidney, RIGHT ureter in urinary bladder. Nonobstructing LEFT renal calculi largest 6 mm diameter. No LEFT hydronephrosis, LEFT hydroureter or LEFT ureteral calcification. Stomach/Bowel: Normal appendix. Diverticulosis of descending and sigmoid colon without pericolic inflammatory changes to suggest acute diverticulitis. Small hiatal hernia. Stomach and small bowel loops otherwise unremarkable. Vascular/Lymphatic: Atherosclerotic calcifications aorta and iliac arteries. Aorta normal caliber. No adenopathy. Reproductive: Unremarkable uterus and adnexa Other: No free air free fluid.  No hernia or inflammatory process Musculoskeletal: Schmorl's nodes lumbar  spine at L2-L5. Mild sclerosis of the L2 and L3 vertebral bodies, see dedicated CT lumbar spine exam. IMPRESSION: Nonobstructing LEFT renal calculi without evidence of hydronephrosis or hydroureter. Hepatic steatosis. Distal colonic diverticulosis. Small hiatal hernia. Aortic atherosclerosis. Degenerative disc disease changes lumbar spine with mild sclerosis in the L2 and L3 vertebral bodies, please refer to dedicated CT lumbar spine report. Electronically Signed   By: Ulyses Southward M.D.   On: 03/13/2016 11:46    Procedures Procedures (including critical care time)  Medications Ordered in ED Medications  sodium chloride 0.9 % bolus 1,000  mL (0 mLs Intravenous Stopped 03/13/16 1405)  morphine 4 MG/ML injection 4 mg (4 mg Intravenous Given 03/13/16 1047)  morphine 4 MG/ML injection 4 mg (4 mg Intravenous Given 03/13/16 1404)  ondansetron (ZOFRAN) injection 4 mg (4 mg Intravenous Given 03/13/16 1404)  LORazepam (ATIVAN) injection 0.5 mg (0.5 mg Intravenous Given 03/13/16 1549)     Initial Impression / Assessment and Plan / ED Course  I have reviewed the triage vital signs and the nursing notes.  Pertinent labs & imaging results that were available during my care of the patient were reviewed by me and considered in my medical decision making (see chart for details).     61 yo F With a chief complaint of left-sided low back pain. This is reproduced on exam with palpation of the paraspinal musculature. However I am concerned that the patient is also having subjective fevers and nausea at home. I will obtain a CT scan to evaluate for kidney stones and reformatted the L-spine. This is negative I am likely forced consider the possibility of an epidural abscess with recent possible throat infection and now fever and low back pain.  Discussed with Dr. Penne Lash, please see his note for further care.   The patients results and plan were reviewed and discussed.   Any x-rays performed were independently reviewed  by myself.   Differential diagnosis were considered with the presenting HPI.  Medications  sodium chloride 0.9 % bolus 1,000 mL (0 mLs Intravenous Stopped 03/13/16 1405)  morphine 4 MG/ML injection 4 mg (4 mg Intravenous Given 03/13/16 1047)  morphine 4 MG/ML injection 4 mg (4 mg Intravenous Given 03/13/16 1404)  ondansetron (ZOFRAN) injection 4 mg (4 mg Intravenous Given 03/13/16 1404)  LORazepam (ATIVAN) injection 0.5 mg (0.5 mg Intravenous Given 03/13/16 1549)    Vitals:   03/13/16 1000 03/13/16 1015 03/13/16 1246 03/13/16 1451  BP: 197/69  154/66 189/78  Pulse: (!) 59 63 (!) 58 62  Resp:   15 15  Temp:      TempSrc:      SpO2: 92% 94% 90% 91%    Final diagnoses:  Kidney stone  Back pain       Final Clinical Impressions(s) / ED Diagnoses   Final diagnoses:  Kidney stone  Back pain    New Prescriptions New Prescriptions   No medications on file     Melene Plan, DO 03/13/16 1634

## 2016-03-13 NOTE — ED Notes (Signed)
Returns from [facility] And tells us she is comfortable.

## 2016-03-13 NOTE — ED Notes (Signed)
This RN attempted blood draw, unsuccessful. Phlebotomy called.

## 2016-03-13 NOTE — ED Notes (Signed)
Patient transported to MRI. She remains awake and in no distress.

## 2016-03-14 DIAGNOSIS — M4646 Discitis, unspecified, lumbar region: Secondary | ICD-10-CM | POA: Diagnosis present

## 2016-03-14 DIAGNOSIS — K760 Fatty (change of) liver, not elsewhere classified: Secondary | ICD-10-CM | POA: Diagnosis present

## 2016-03-14 DIAGNOSIS — M4726 Other spondylosis with radiculopathy, lumbar region: Secondary | ICD-10-CM | POA: Diagnosis present

## 2016-03-14 DIAGNOSIS — I10 Essential (primary) hypertension: Secondary | ICD-10-CM | POA: Diagnosis present

## 2016-03-14 DIAGNOSIS — E860 Dehydration: Secondary | ICD-10-CM | POA: Diagnosis present

## 2016-03-14 DIAGNOSIS — N200 Calculus of kidney: Secondary | ICD-10-CM | POA: Diagnosis present

## 2016-03-14 DIAGNOSIS — Z888 Allergy status to other drugs, medicaments and biological substances status: Secondary | ICD-10-CM | POA: Diagnosis not present

## 2016-03-14 DIAGNOSIS — E785 Hyperlipidemia, unspecified: Secondary | ICD-10-CM | POA: Diagnosis present

## 2016-03-14 DIAGNOSIS — M545 Low back pain: Secondary | ICD-10-CM | POA: Diagnosis not present

## 2016-03-14 DIAGNOSIS — Z7982 Long term (current) use of aspirin: Secondary | ICD-10-CM | POA: Diagnosis not present

## 2016-03-14 DIAGNOSIS — Z87891 Personal history of nicotine dependence: Secondary | ICD-10-CM | POA: Diagnosis not present

## 2016-03-14 DIAGNOSIS — R7989 Other specified abnormal findings of blood chemistry: Secondary | ICD-10-CM | POA: Diagnosis present

## 2016-03-14 DIAGNOSIS — Z8249 Family history of ischemic heart disease and other diseases of the circulatory system: Secondary | ICD-10-CM | POA: Diagnosis not present

## 2016-03-14 DIAGNOSIS — E876 Hypokalemia: Secondary | ICD-10-CM | POA: Diagnosis present

## 2016-03-14 DIAGNOSIS — R112 Nausea with vomiting, unspecified: Secondary | ICD-10-CM | POA: Diagnosis not present

## 2016-03-14 DIAGNOSIS — Z882 Allergy status to sulfonamides status: Secondary | ICD-10-CM | POA: Diagnosis not present

## 2016-03-14 LAB — COMPREHENSIVE METABOLIC PANEL
ALT: 27 U/L (ref 14–54)
AST: 20 U/L (ref 15–41)
Albumin: 3.5 g/dL (ref 3.5–5.0)
Alkaline Phosphatase: 48 U/L (ref 38–126)
Anion gap: 8 (ref 5–15)
BUN: 8 mg/dL (ref 6–20)
CO2: 24 mmol/L (ref 22–32)
Calcium: 8 mg/dL — ABNORMAL LOW (ref 8.9–10.3)
Chloride: 103 mmol/L (ref 101–111)
Creatinine, Ser: 0.65 mg/dL (ref 0.44–1.00)
GFR calc Af Amer: >60 mL/min (ref >60)
GFR calc non Af Amer: >60 mL/min (ref >60)
Glucose, Bld: 92 mg/dL (ref 65–99)
Potassium: 4.2 mmol/L (ref 3.5–5.1)
Sodium: 135 mmol/L (ref 135–145)
Total Bilirubin: 0.8 mg/dL (ref 0.3–1.2)
Total Protein: 5.7 g/dL — ABNORMAL LOW (ref 6.5–8.1)

## 2016-03-14 LAB — CBC
HCT: 37.3 % (ref 36.0–46.0)
Hemoglobin: 12.6 g/dL (ref 12.0–15.0)
MCH: 30.4 pg (ref 26.0–34.0)
MCHC: 33.8 g/dL (ref 30.0–36.0)
MCV: 89.9 fL (ref 78.0–100.0)
Platelets: 157 10*3/uL (ref 150–400)
RBC: 4.15 MIL/uL (ref 3.87–5.11)
RDW: 11.9 % (ref 11.5–15.5)
WBC: 7.6 10*3/uL (ref 4.0–10.5)

## 2016-03-14 LAB — TSH: TSH: 1.358 u[IU]/mL (ref 0.350–4.500)

## 2016-03-14 LAB — MAGNESIUM: Magnesium: 2 mg/dL (ref 1.7–2.4)

## 2016-03-14 LAB — PHOSPHORUS: Phosphorus: 2.1 mg/dL — ABNORMAL LOW (ref 2.5–4.6)

## 2016-03-14 LAB — SEDIMENTATION RATE: Sed Rate: 3 mm/hr (ref 0–22)

## 2016-03-14 LAB — C-REACTIVE PROTEIN: CRP: <0.8 mg/dL (ref <1.0)

## 2016-03-14 MED ORDER — HYDRALAZINE HCL 20 MG/ML IJ SOLN
20.0000 mg | Freq: Once | INTRAMUSCULAR | Status: AC
  Administered 2016-03-14: 20 mg via INTRAVENOUS

## 2016-03-14 MED ORDER — ONDANSETRON HCL 4 MG/2ML IJ SOLN
4.0000 mg | Freq: Four times a day (QID) | INTRAMUSCULAR | Status: DC | PRN
  Administered 2016-03-14 – 2016-03-21 (×7): 4 mg via INTRAVENOUS
  Filled 2016-03-14 (×7): qty 2

## 2016-03-14 MED ORDER — SODIUM CHLORIDE 0.9 % IV SOLN
INTRAVENOUS | Status: DC
  Administered 2016-03-14: 01:00:00 via INTRAVENOUS

## 2016-03-14 MED ORDER — METOPROLOL TARTRATE 50 MG PO TABS
50.0000 mg | ORAL_TABLET | Freq: Every day | ORAL | Status: DC
  Administered 2016-03-14 – 2016-03-22 (×8): 50 mg via ORAL
  Filled 2016-03-14 (×9): qty 1

## 2016-03-14 MED ORDER — LOSARTAN POTASSIUM 50 MG PO TABS
100.0000 mg | ORAL_TABLET | Freq: Every day | ORAL | Status: DC
  Administered 2016-03-14 – 2016-03-22 (×8): 100 mg via ORAL
  Filled 2016-03-14 (×9): qty 2

## 2016-03-14 MED ORDER — GABAPENTIN 100 MG PO CAPS
100.0000 mg | ORAL_CAPSULE | Freq: Every day | ORAL | Status: DC
  Administered 2016-03-14 – 2016-03-21 (×9): 100 mg via ORAL
  Filled 2016-03-14 (×9): qty 1

## 2016-03-14 MED ORDER — HYDRALAZINE HCL 20 MG/ML IJ SOLN
10.0000 mg | Freq: Once | INTRAMUSCULAR | Status: AC
  Administered 2016-03-14: 10 mg via INTRAVENOUS
  Filled 2016-03-14: qty 1

## 2016-03-14 MED ORDER — CITALOPRAM HYDROBROMIDE 20 MG PO TABS
20.0000 mg | ORAL_TABLET | Freq: Every day | ORAL | Status: DC
  Administered 2016-03-14 – 2016-03-22 (×8): 20 mg via ORAL
  Filled 2016-03-14 (×9): qty 1

## 2016-03-14 MED ORDER — SODIUM CHLORIDE 0.9 % IV SOLN
INTRAVENOUS | Status: DC
  Administered 2016-03-14: 10:00:00 via INTRAVENOUS

## 2016-03-14 MED ORDER — OXYCODONE HCL 5 MG PO TABS: 5.0000 mg | ORAL_TABLET | ORAL | Status: DC | PRN

## 2016-03-14 MED ORDER — MORPHINE SULFATE (PF) 2 MG/ML IV SOLN
1.0000 mg | INTRAVENOUS | Status: DC | PRN
  Administered 2016-03-14 – 2016-03-17 (×6): 1 mg via INTRAVENOUS
  Filled 2016-03-14 (×6): qty 1

## 2016-03-14 MED ORDER — ACETAMINOPHEN 325 MG PO TABS: 650.0000 mg | ORAL_TABLET | Freq: Four times a day (QID) | ORAL | Status: DC | PRN

## 2016-03-14 MED ORDER — KETOROLAC TROMETHAMINE 15 MG/ML IJ SOLN
15.0000 mg | Freq: Four times a day (QID) | INTRAMUSCULAR | Status: DC | PRN
  Administered 2016-03-14 (×2): 15 mg via INTRAVENOUS
  Filled 2016-03-14 (×2): qty 1

## 2016-03-14 MED ORDER — ONDANSETRON HCL 4 MG PO TABS
4.0000 mg | ORAL_TABLET | Freq: Four times a day (QID) | ORAL | Status: DC | PRN
  Administered 2016-03-14: 4 mg via ORAL
  Filled 2016-03-14 (×2): qty 1

## 2016-03-14 MED ORDER — SODIUM CHLORIDE 0.9 % IV SOLN
INTRAVENOUS | Status: DC
  Administered 2016-03-14 – 2016-03-16 (×4): via INTRAVENOUS

## 2016-03-14 MED ORDER — ACETAMINOPHEN 650 MG RE SUPP: 650.0000 mg | Freq: Four times a day (QID) | RECTAL | Status: DC | PRN

## 2016-03-14 MED ORDER — SODIUM PHOSPHATES 45 MMOLE/15ML IV SOLN
10.0000 mmol | Freq: Once | INTRAVENOUS | Status: AC
  Administered 2016-03-14: 10 mmol via INTRAVENOUS
  Filled 2016-03-14: qty 3.33

## 2016-03-14 MED ORDER — HYDRALAZINE HCL 20 MG/ML IJ SOLN
INTRAMUSCULAR | Status: AC
  Filled 2016-03-14: qty 1

## 2016-03-14 MED ORDER — POTASSIUM & SODIUM PHOSPHATES 280-160-250 MG PO PACK
1.0000 | PACK | Freq: Once | ORAL | Status: DC
  Filled 2016-03-14: qty 1

## 2016-03-14 MED ORDER — HYDROCODONE-ACETAMINOPHEN 5-325 MG PO TABS
1.0000 | ORAL_TABLET | Freq: Four times a day (QID) | ORAL | Status: DC | PRN
  Administered 2016-03-14 (×2): 2 via ORAL
  Administered 2016-03-14: 1 via ORAL
  Administered 2016-03-15 – 2016-03-16 (×3): 2 via ORAL
  Filled 2016-03-14: qty 1
  Filled 2016-03-14 (×5): qty 2

## 2016-03-14 MED ORDER — AMLODIPINE BESYLATE 5 MG PO TABS
5.0000 mg | ORAL_TABLET | Freq: Every day | ORAL | Status: DC
  Administered 2016-03-14 – 2016-03-16 (×3): 5 mg via ORAL
  Filled 2016-03-14 (×3): qty 1

## 2016-03-14 NOTE — Progress Notes (Signed)
Pt arrived to room 1514. Pt alert and oriented x4. Right hand SL in place. Pt having left sided pain. No nausea at this time. Pt oriented to room, call bell, pain scale, orders, etc. Will continue to monitor.

## 2016-03-14 NOTE — Consult Note (Signed)
Reason for Consult:back pain and left flank pain abnormal MRI Referring Physician: Dr.Regalato  Sheryl Bates is an 61 y.o. female.  HPI: patient is a 61 year old individual who has had several days of significant back and flank pain. She had been seen a little over week ago and was given a prednisone dose course which seemed to make some of her symptoms initially better but then on Sunday they got considerably worse. An MRI of lumbar spine was completed yesterday and this demonstrates presence of inflammation around the disc space and the endplates at L2 and L3 into a lesser extent at L3-L4 there is no sigificant bulging or herniation of the disc and there is no significant epidural o suggest an infectious processnonetheless the in is been quite seved disabling area during his hospitalization CBC sedimentation rate and CRP are all within the limits of normal. Patient has not been febrile. Cultures have been taken are pending.  I reviewed the [place] the radiologist and note that this typically ould represent an inflammatory process. In light of the fact that there is no evidence for infection, I have suggested that she may benefit from a translaminar epidural steroid injection at the level of L3-L4 level asked interventional radiology to provide this service for her. I be glad to follow-up with her regarding back pain in approximately 3 weeks.  Past Medical History:  Diagnosis Date  . Allergy   . CIN I (cervical intraepithelial neoplasia I)   . Endometriosis   . Hyperlipidemia   . Hypertension   . Kidney stone   . Ovarian cyst    right  . Vitamin D deficiency     Past Surgical History:  Procedure Laterality Date  . CERVICAL BIOPSY  W/ LOOP ELECTRODE EXCISION    . COLPOSCOPY    . KNEE SURGERY     Right  . LAPAROSCOPY  1996  . LEEP  1992  . PELVIC LAPAROSCOPY      Family History  Problem Relation Age of Onset  . Hypertension Mother   . Heart disease Mother   . Diabetes Mother    . Heart disease Father 79    CHF    Social History:  reports that she quit smoking about 10 years ago. She has never used smokeless tobacco. She reports that she does not drink alcohol or use drugs.  Allergies:  Allergies  Allergen Reactions  . Sulfonamide Derivatives Diarrhea and Nausea Only  . Lisinopril Cough    Medications: I have reviewed the patient's current medications.  Results for orders placed or performed during the hospital encounter of 03/13/16 (from the past 48 hour(s))  Urinalysis, Routine w reflex microscopic- may I&O cath if menses     Status: Abnormal   Collection Time: 03/13/16  9:29 AM  Result Value Ref Range   Color, Urine YELLOW YELLOW   APPearance CLOUDY (A) CLEAR   Specific Gravity, Urine 1.014 1.005 - 1.030   pH 7.0 5.0 - 8.0   Glucose, UA NEGATIVE NEGATIVE mg/dL   Hgb urine dipstick SMALL (A) NEGATIVE   Bilirubin Urine NEGATIVE NEGATIVE   Ketones, ur 80 (A) NEGATIVE mg/dL   Protein, ur 30 (A) NEGATIVE mg/dL   Nitrite NEGATIVE NEGATIVE   Leukocytes, UA SMALL (A) NEGATIVE   RBC / HPF 0-5 0 - 5 RBC/hpf   WBC, UA 0-5 0 - 5 WBC/hpf   Bacteria, UA RARE (A) NONE SEEN   Squamous Epithelial / LPF 0-5 (A) NONE SEEN   Mucous PRESENT  CBC with Differential     Status: None   Collection Time: 03/13/16 10:44 AM  Result Value Ref Range   WBC 8.9 4.0 - 10.5 K/uL   RBC 4.66 3.87 - 5.11 MIL/uL   Hemoglobin 14.3 12.0 - 15.0 g/dL   HCT 41.5 36.0 - 46.0 %   MCV 89.1 78.0 - 100.0 fL   MCH 30.7 26.0 - 34.0 pg   MCHC 34.5 30.0 - 36.0 g/dL   RDW 11.9 11.5 - 15.5 %   Platelets 174 150 - 400 K/uL   Neutrophils Relative % 82 %   Neutro Abs 7.2 1.7 - 7.7 K/uL   Lymphocytes Relative 11 %   Lymphs Abs 1.0 0.7 - 4.0 K/uL   Monocytes Relative 7 %   Monocytes Absolute 0.6 0.1 - 1.0 K/uL   Eosinophils Relative 0 %   Eosinophils Absolute 0.0 0.0 - 0.7 K/uL   Basophils Relative 0 %   Basophils Absolute 0.0 0.0 - 0.1 K/uL  Comprehensive metabolic panel     Status:  Abnormal   Collection Time: 03/13/16 10:44 AM  Result Value Ref Range   Sodium 135 135 - 145 mmol/L   Potassium 3.2 (L) 3.5 - 5.1 mmol/L   Chloride 99 (L) 101 - 111 mmol/L   CO2 27 22 - 32 mmol/L   Glucose, Bld 113 (H) 65 - 99 mg/dL   BUN 12 6 - 20 mg/dL   Creatinine, Ser 0.77 0.44 - 1.00 mg/dL   Calcium 8.5 (L) 8.9 - 10.3 mg/dL   Total Protein 6.4 (L) 6.5 - 8.1 g/dL   Albumin 4.0 3.5 - 5.0 g/dL   AST 24 15 - 41 U/L   ALT 32 14 - 54 U/L   Alkaline Phosphatase 54 38 - 126 U/L   Total Bilirubin 1.2 0.3 - 1.2 mg/dL   GFR calc non Af Amer >60 >60 mL/min   GFR calc Af Amer >60 >60 mL/min    Comment: (NOTE) The eGFR has been calculated using the CKD EPI equation. This calculation has not been validated in all clinical situations. eGFR's persistently <60 mL/min signify possible Chronic Kidney Disease.    Anion gap 9 5 - 15  Lipase, blood     Status: None   Collection Time: 03/13/16 10:44 AM  Result Value Ref Range   Lipase 26 11 - 51 U/L  I-Stat Chem 8, ED     Status: Abnormal   Collection Time: 03/13/16 10:47 AM  Result Value Ref Range   Sodium 137 135 - 145 mmol/L   Potassium 3.3 (L) 3.5 - 5.1 mmol/L   Chloride 97 (L) 101 - 111 mmol/L   BUN 11 6 - 20 mg/dL   Creatinine, Ser 0.80 0.44 - 1.00 mg/dL   Glucose, Bld 113 (H) 65 - 99 mg/dL   Calcium, Ion 1.09 (L) 1.15 - 1.40 mmol/L   TCO2 26 0 - 100 mmol/L   Hemoglobin 15.0 12.0 - 15.0 g/dL   HCT 44.0 36.0 - 46.0 %  Influenza panel by PCR (type A & B)     Status: None   Collection Time: 03/13/16  8:52 PM  Result Value Ref Range   Influenza A By PCR NEGATIVE NEGATIVE   Influenza B By PCR NEGATIVE NEGATIVE    Comment: (NOTE) The Xpert Xpress Flu assay is intended as an aid in the diagnosis of  influenza and should not be used as a sole basis for treatment.  This  assay is FDA approved for nasopharyngeal  swab specimens only. Nasal  washings and aspirates are unacceptable for Xpert Xpress Flu testing.   Sedimentation rate      Status: None   Collection Time: 03/13/16  9:50 PM  Result Value Ref Range   Sed Rate 3 0 - 22 mm/hr  C-reactive protein     Status: None   Collection Time: 03/13/16  9:50 PM  Result Value Ref Range   CRP <0.8 <1.0 mg/dL    Comment: Performed at [facility], [place] [address] [place], [place] [address]  Blood culture (routine x 2)     Status: None (Preliminary result)   Collection Time: 03/13/16  9:50 PM  Result Value Ref Range   Specimen Description BLOOD LEFT WRIST    Special Requests BOTTLES DRAWN AEROBIC AND ANAEROBIC 5ML    Culture      NO GROWTH < 24 HOURS Performed at [facility], [place] [address] [place], [place] [address]    Report Status PENDING   Blood culture (routine x 2)     Status: None (Preliminary result)   Collection Time: 03/13/16 10:15 PM  Result Value Ref Range   Specimen Description BLOOD RIGHT FOREARM    Special Requests BOTTLES DRAWN AEROBIC AND ANAEROBIC 5ML    Culture      NO GROWTH < 24 HOURS Performed at [facility], [place] [address], [place], [place] [address]    Report Status PENDING   Magnesium     Status: None   Collection Time: 03/13/16 10:15 PM  Result Value Ref Range   Magnesium 2.3 1.7 - 2.4 mg/dL  Phosphorus     Status: Abnormal   Collection Time: 03/13/16 10:15 PM  Result Value Ref Range   Phosphorus 2.1 (L) 2.5 - 4.6 mg/dL  I-Stat CG4 Lactic Acid, ED     Status: None   Collection Time: 03/13/16 10:29 PM  Result Value Ref Range   Lactic Acid, Venous 0.83 0.5 - 1.9 mmol/L  Magnesium     Status: None   Collection Time: 03/14/16  5:53 AM  Result Value Ref Range   Magnesium 2.0 1.7 - 2.4 mg/dL  Phosphorus     Status: Abnormal   Collection Time: 03/14/16  5:53 AM  Result Value Ref Range   Phosphorus 2.1 (L) 2.5 - 4.6 mg/dL  TSH     Status: None   Collection Time: 03/14/16  5:53 AM  Result Value Ref Range   TSH 1.358 0.350 - 4.500 uIU/mL    Comment: Performed by a 3rd Generation assay with a functional sensitivity of  <=0.01 uIU/mL.  Comprehensive metabolic panel     Status: Abnormal   Collection Time: 03/14/16  5:53 AM  Result Value Ref Range   Sodium 135 135 - 145 mmol/L   Potassium 4.2 3.5 - 5.1 mmol/L    Comment: DELTA CHECK NOTED REPEATED TO VERIFY NO VISIBLE HEMOLYSIS    Chloride 103 101 - 111 mmol/L   CO2 24 22 - 32 mmol/L   Glucose, Bld 92 65 - 99 mg/dL   BUN 8 6 - 20 mg/dL   Creatinine, Ser 0.65 0.44 - 1.00 mg/dL   Calcium 8.0 (L) 8.9 - 10.3 mg/dL   Total Protein 5.7 (L) 6.5 - 8.1 g/dL   Albumin 3.5 3.5 - 5.0 g/dL   AST 20 15 - 41 U/L   ALT 27 14 - 54 U/L   Alkaline Phosphatase 48 38 - 126 U/L   Total Bilirubin 0.8 0.3 -  1.2 mg/dL   GFR calc non Af Amer >60 >60 mL/min   GFR calc Af Amer >60 >60 mL/min    Comment: (NOTE) The eGFR has been calculated using the CKD EPI equation. This calculation has not been validated in all clinical situations. eGFR's persistently <60 mL/min signify possible Chronic Kidney Disease.    Anion gap 8 5 - 15  CBC     Status: None   Collection Time: 03/14/16  5:53 AM  Result Value Ref Range   WBC 7.6 4.0 - 10.5 K/uL   RBC 4.15 3.87 - 5.11 MIL/uL   Hemoglobin 12.6 12.0 - 15.0 g/dL   HCT 37.3 36.0 - 46.0 %   MCV 89.9 78.0 - 100.0 fL   MCH 30.4 26.0 - 34.0 pg   MCHC 33.8 30.0 - 36.0 g/dL   RDW 11.9 11.5 - 15.5 %   Platelets 157 150 - 400 K/uL    Mr Thoracic Spine W Wo Contrast  Result Date: 03/13/2016 CLINICAL DATA:  61 y/o F; sudden onset left-sided lower back pain. Concern for epidural abscess. EXAM: MRI THORACIC AND LUMBAR SPINE WITHOUT AND WITH CONTRAST TECHNIQUE: Multiplanar and multiecho pulse sequences of the thoracic and lumbar spine were obtained without and with intravenous contrast. CONTRAST:  30m MULTIHANCE GADOBENATE DIMEGLUMINE 529 MG/ML IV SOLN COMPARISON:  CT chest, abdomen, and pelvis dated 03/11/2016. FINDINGS: MRI THORACIC SPINE FINDINGS Alignment:  Physiologic. Vertebrae: No fracture, evidence of discitis, or bone lesion. T8  inferior endplate nonenhancing Schmorl's node. Cord:  Normal signal and morphology. Paraspinal and other soft tissues: Negative. Disc levels: No significant disc displacement, foraminal narrowing, or canal stenosis. MRI LUMBAR SPINE FINDINGS Segmentation:  Standard. Alignment:  Physiologic. Vertebrae: There are enhancing central deformity within the an L2 superior endplate, L3 inferior end plate, at L4 superior endplate with edema and enhancement. At L2-3 there is central disc enhancement and edema in adjacent L2 and L3 endplates with enhancement. Conus medullaris: Extends to the L1-2 level and appears normal. Paraspinal and other soft tissues: No paravertebral inflammatory changes or enhancement. Disc levels: L1-2: Small disc bulge with mild foraminal narrowing. No significant canal stenosis. L2-3: Small disc bulge with mild foraminal and lateral recess narrowing. No significant canal stenosis. L3-4: Small disc bulge with mild foraminal and lateral recess narrowing. No significant canal stenosis. L4-5: Small disc bulge and mild bilateral facet hypertrophy greater on the right. Mild foraminal and lateral recess narrowing. L5-S1: Small central protrusion and moderate right-greater-than-left facet hypertrophy. No significant foraminal narrowing and canal stenosis. No significant canal stenosis. 7 x 9 mm synovial cyst in right paraspinal muscles. IMPRESSION: 1. Enhancing central and deformity within the L2 superior endplate, L3 inferior endplate, at L4 superior endplate with edema and enhancement. At L2-3 there is central disc enhancement extending into the L2 inferior endplate central deformity and enhanced throughout the adjacent L2 and L3 endplates. On prior CT these lesions have the appearance of Schmorl's nodes. Enhancement within the L2-3 intervertebral disc could represent infection but there is no significant disc edema or surrounding soft tissue edema and infection is considered less likely. 2. Mild lumbar  spondylosis with multiple levels of mild foraminal narrowing. 3. Normal thoracic MRI.  No abnormal enhancement. Electronically Signed   By: LKristine GarbeM.D.   On: 03/13/2016 18:08   Mr Lumbar Spine W Wo Contrast  Result Date: 03/13/2016 CLINICAL DATA:  61y/o F; sudden onset left-sided lower back pain. Concern for epidural abscess. EXAM: MRI THORACIC AND LUMBAR SPINE WITHOUT AND  WITH CONTRAST TECHNIQUE: Multiplanar and multiecho pulse sequences of the thoracic and lumbar spine were obtained without and with intravenous contrast. CONTRAST:  72m MULTIHANCE GADOBENATE DIMEGLUMINE 529 MG/ML IV SOLN COMPARISON:  CT chest, abdomen, and pelvis dated 03/11/2016. FINDINGS: MRI THORACIC SPINE FINDINGS Alignment:  Physiologic. Vertebrae: No fracture, evidence of discitis, or bone lesion. T8 inferior endplate nonenhancing Schmorl's node. Cord:  Normal signal and morphology. Paraspinal and other soft tissues: Negative. Disc levels: No significant disc displacement, foraminal narrowing, or canal stenosis. MRI LUMBAR SPINE FINDINGS Segmentation:  Standard. Alignment:  Physiologic. Vertebrae: There are enhancing central deformity within the an L2 superior endplate, L3 inferior end plate, at L4 superior endplate with edema and enhancement. At L2-3 there is central disc enhancement and edema in adjacent L2 and L3 endplates with enhancement. Conus medullaris: Extends to the L1-2 level and appears normal. Paraspinal and other soft tissues: No paravertebral inflammatory changes or enhancement. Disc levels: L1-2: Small disc bulge with mild foraminal narrowing. No significant canal stenosis. L2-3: Small disc bulge with mild foraminal and lateral recess narrowing. No significant canal stenosis. L3-4: Small disc bulge with mild foraminal and lateral recess narrowing. No significant canal stenosis. L4-5: Small disc bulge and mild bilateral facet hypertrophy greater on the right. Mild foraminal and lateral recess narrowing.  L5-S1: Small central protrusion and moderate right-greater-than-left facet hypertrophy. No significant foraminal narrowing and canal stenosis. No significant canal stenosis. 7 x 9 mm synovial cyst in right paraspinal muscles. IMPRESSION: 1. Enhancing central and deformity within the L2 superior endplate, L3 inferior endplate, at L4 superior endplate with edema and enhancement. At L2-3 there is central disc enhancement extending into the L2 inferior endplate central deformity and enhanced throughout the adjacent L2 and L3 endplates. On prior CT these lesions have the appearance of Schmorl's nodes. Enhancement within the L2-3 intervertebral disc could represent infection but there is no significant disc edema or surrounding soft tissue edema and infection is considered less likely. 2. Mild lumbar spondylosis with multiple levels of mild foraminal narrowing. 3. Normal thoracic MRI.  No abnormal enhancement. Electronically Signed   By: LKristine GarbeM.D.   On: 03/13/2016 18:08   Ct L-spine No Charge  Result Date: 03/13/2016 CLINICAL DATA:  Neck pain, LEFT flank pain since Sunday EXAM: CT LUMBAR SPINE WITHOUT CONTRAST TECHNIQUE: Multidetector CT imaging of the lumbar spine was performed without intravenous contrast administration. Multiplanar CT image reconstructions were also generated. COMPARISON:  CT abdomen/pelvis 03/10/2016 FINDINGS: Segmentation: Exam labeled with 5 non-rib-bearing lumbar vertebra. Alignment: Normal Vertebrae: Schmorl's nodes at the superior inferior endplates of L2 and L3 as well as the superior endplates of L4 and L5. Mild facet degenerative changes in lower lumbar spine. Probable discogenic sclerosis in the L2 and L3 vertebral bodies, minimally surrounding Schmorl's node at superior endplate L4 as well. Vertebral body heights maintained without fracture or bone destruction. SI joints preserved. Paraspinal and other soft tissues: Atherosclerotic calcification aorta. Nonobstructing  lower pole LEFT renal calculus at margin of exam please refer to CT abdomen report. No other soft tissue abnormalities. Disc levels: Disc space narrowing at L1-L2, L2-L3, less at remaining lumbar levels. Diffusely bulging discs at throughout the lumbar region from L1-L2 through L5-S1 without definite focal disc herniation or neural compression. IMPRESSION: Degenerative disc and facet disease changes of the lumbar spine as above. Probable discogenic sclerosis at L2 and L3 vertebral bodies. No definite acute lumbar spine abnormalities. Nonobstructing LEFT renal calculus. Electronically Signed   By: MCrist InfanteD.  On: 03/13/2016 11:51   Ct Renal Stone Study  Result Date: 03/13/2016 CLINICAL DATA:  LEFT flank pain since Sunday, nausea, vomiting, hypertension EXAM: CT ABDOMEN AND PELVIS WITHOUT CONTRAST TECHNIQUE: Multidetector CT imaging of the abdomen and pelvis was performed following the standard protocol without IV contrast. Sagittal and coronal MPR images reconstructed from axial data set. Oral contrast not administered for this indication. COMPARISON:  03/10/2016 FINDINGS: Lower chest: Minimal dependent atelectasis Hepatobiliary: Fatty infiltration of liver. Gallbladder and liver otherwise unremarkable. Pancreas: Normal appearance Spleen: Normal appearance Adrenals/Urinary Tract: Thickened adrenal glands without discrete mass. Unremarkable RIGHT kidney, RIGHT ureter in urinary bladder. Nonobstructing LEFT renal calculi largest 6 mm diameter. No LEFT hydronephrosis, LEFT hydroureter or LEFT ureteral calcification. Stomach/Bowel: Normal appendix. Diverticulosis of descending and sigmoid colon without pericolic inflammatory changes to suggest acute diverticulitis. Small hiatal hernia. Stomach and small bowel loops otherwise unremarkable. Vascular/Lymphatic: Atherosclerotic calcifications aorta and iliac arteries. Aorta normal caliber. No adenopathy. Reproductive: Unremarkable uterus and adnexa Other: No free  air free fluid.  No hernia or inflammatory process Musculoskeletal: Schmorl's nodes lumbar spine at L2-L5. Mild sclerosis of the L2 and L3 vertebral bodies, see dedicated CT lumbar spine exam. IMPRESSION: Nonobstructing LEFT renal calculi without evidence of hydronephrosis or hydroureter. Hepatic steatosis. Distal colonic diverticulosis. Small hiatal hernia. Aortic atherosclerosis. Degenerative disc disease changes lumbar spine with mild sclerosis in the L2 and L3 vertebral bodies, please refer to dedicated CT lumbar spine report. Electronically Signed   By: Lavonia Dana M.D.   On: 03/13/2016 11:46    Review of Systems  Constitutional: Negative.   HENT: Negative.   Eyes: Negative.   Respiratory: Negative.   Cardiovascular: Negative.   Gastrointestinal: Negative.   Genitourinary: Negative.   Musculoskeletal: Positive for back pain.  Skin: Negative.   Neurological: Negative.   Endo/Heme/Allergies: Negative.   Psychiatric/Behavioral: Negative.    Blood pressure (!) 152/68, pulse 71, temperature 99 F (37.2 C), temperature source Oral, resp. rate 20, height '5\' 3"'  (1.6 m), weight 91.6 kg (201 lb 15.1 oz), SpO2 96 %. Physical Exam  Constitutional: She is oriented to person, place, and time. She appears well-developed and well-nourished.  HENT:  Head: Normocephalic and atraumatic.  Eyes: Conjunctivae and EOM are normal. Pupils are equal, round, and reactive to light.  Neck: Normal range of motion. Neck supple.  Respiratory: Effort normal.  GI: Soft. Bowel sounds are normal.  Musculoskeletal:  Left flank tenderness to palpation and percussion. Midline back tenderness is minimal to deep palpation  Neurological: She is alert and oriented to person, place, and time. She has normal reflexes.    Assessment/Plan: Nonspecific inflammatory process of the lumbar spine. Centralized back pain.  Plan interventional radiology consult for translaminar epidural steroid injection L3-4  Reggie Welge  J 03/14/2016, 10:34 PM

## 2016-03-14 NOTE — Progress Notes (Signed)
PT Cancellation Note  Patient Details Name: Sheryl HoppingRae Michele Santillo MRN: [id number] DOB: 11/27/1955   Cancelled Treatment:    Reason Eval/Treat Not Completed: Medical issues which prohibited therapy (incr. BP per RN)   [name], [name] [name] PT [phone]  03/14/2016, 1:04 PM

## 2016-03-14 NOTE — Progress Notes (Signed)
BP 182/92 manual to left arm. Pt states she has not taken BP meds since Sunday because of nausea. Will page on call provider for further instructions. Does have BP meds ordered at 0800. Pt has no BP related symptoms. Will continue to monitor.

## 2016-03-14 NOTE — Progress Notes (Signed)
Initial Nutrition Assessment  DOCUMENTATION CODES:   Not applicable  INTERVENTION:   RD will order supplements when diet advanced   NUTRITION DIAGNOSIS:   Inadequate oral intake related to inability to eat, vomiting, nausea as evidenced by NPO status.  GOAL:   Patient will meet greater than or equal to 90% of their needs  MONITOR:   Diet advancement, Weight trends, Labs  REASON FOR ASSESSMENT:   Malnutrition Screening Tool    ASSESSMENT:    61 y.o. female with medical history significant of HTN endometriosis HLD medically for intractable nausea and vomiting back pain with questionable discitis vs musculoskeletal vs neuropathic. Found to have left kidney stone    Met with pt and husband in room today. Pt was sleeping, so information was obtained from husband who reports that patient has been having poor appetite and oral intake, nausea, and vomiting for 5 days pta. Pt is unable to keep anything down and is currently NPO. Husband is unsure if pt has lost any weight. Per chart, pt's weight appears to be stable; however, last weight history obtained was from 2015. Pt had CT scan today and was found to have kidney stone. Urology consult pending. Pt with hypophosphatemia today. Patient on [place]. RD will continue to monitor. Husband reports that pt would prefer to have [employer] vs [employer]. Pt unable to tolerate sweet things.   Medications reviewed and include: Celexa, hydrocodone, zofran, ketorolac, Phos-Nak  Labs reviewed: Ca 8.0(L) iCa 1.09(L), P 2.1(L)  Nutrition-Focused physical exam completed. Findings are no fat depletion,no muscle depletion, and no edema.   Diet Order:   NPO  Skin:  Reviewed, no issues  Last BM:  1/28  Height:   Ht Readings from Last 1 Encounters:  03/14/16 '5\' 3"'  (1.6 m)    Weight:   Wt Readings from Last 1 Encounters:  03/14/16 201 lb 15.1 oz (91.6 kg)    Ideal Body Weight:  52.2 kg  BMI:  Body mass index is 35.77 kg/m.  Estimated  Nutritional Needs:   Kcal:  1500-1800kcal/day   Protein:  91-100g/day   Fluid:  >1.5L/day   EDUCATION NEEDS:   No education needs identified at this time  Koleen Distance, RD, LDN Pager #[phone]

## 2016-03-14 NOTE — Consult Note (Addendum)
Consult: kidney stones Requested by: Dr. Sunnie Nielsenegalado   History of Present Illness: 61 yo admitted with three day history of severe left flank back/abdominal pain which has been constant. Associated with nausea and started to have vomiting as well. Pain started suddenly more in her left back that radiated to the left abdomen worse with movement she have had initially some diarrhea. Of note about a week ago patient had URI symptoms and was treated with steroids he have not had any dysuria or hematuria  A CT A/P was done which revealed 1-2 mm LUP stone and a 6 mm LMP stone (1392 HU, visible scout, SSD 7mm). The stones are in a calyx and not causing obstruction/hydronephrosis. UA showed no hematuria and urine cx < 10K mixed.   Again, no dysuria or gross hematuria. She reports her pain is improved.   Past Medical History:  Diagnosis Date  . Allergy   . CIN I (cervical intraepithelial neoplasia I)   . Endometriosis   . Hyperlipidemia   . Hypertension   . Kidney stone   . Ovarian cyst    right  . Vitamin D deficiency    Past Surgical History:  Procedure Laterality Date  . CERVICAL BIOPSY  W/ LOOP ELECTRODE EXCISION    . COLPOSCOPY    . KNEE SURGERY     Right  . LAPAROSCOPY  1996  . LEEP  1992  . PELVIC LAPAROSCOPY      Home Medications:  Prescriptions Prior to Admission  Medication Sig Dispense Refill Last Dose  . aspirin EC 81 MG tablet Take 81 mg by mouth daily.   03/10/2016  . Cholecalciferol (VITAMIN D PO) Take 2 capsules by mouth daily with breakfast.   03/10/2016  . citalopram (CELEXA) 20 MG tablet Take 1 tablet (20 mg total) by mouth daily. 30 tablet 11 03/10/2016  . hydrochlorothiazide (HYDRODIURIL) 25 MG tablet TAKE ONE TABLET BY MOUTH ONCE DAILY 60 tablet 0 03/10/2016  . HYDROcodone-acetaminophen (NORCO/VICODIN) 5-325 MG tablet Take 1-2 tablets by mouth every 6 (six) hours as needed. (Patient taking differently: Take 1-2 tablets by mouth every 6 (six) hours as needed for moderate  pain. ) 15 tablet 0 03/13/2016 at 0830  . losartan (COZAAR) 100 MG tablet Take 1 tablet (100 mg total) by mouth daily. TAKE ONE TABLET BY MOUTH ONE TIME DAILY (Patient taking differently: Take 100 mg by mouth daily. ) 90 tablet 3 03/10/2016  . metoprolol tartrate (LOPRESSOR) 50 MG tablet Take 1 tablet (50 mg total) by mouth 2 (two) times daily. (Patient taking differently: Take 50 mg by mouth daily with breakfast. ) 180 tablet 3 03/10/2016  . ondansetron (ZOFRAN ODT) 4 MG disintegrating tablet Take 1 tablet (4 mg total) by mouth every 8 (eight) hours as needed for nausea or vomiting. 20 tablet 0 03/13/2016 at 0830   Allergies:  Allergies  Allergen Reactions  . Sulfonamide Derivatives Diarrhea and Nausea Only  . Lisinopril Cough    Family History  Problem Relation Age of Onset  . Hypertension Mother   . Heart disease Mother   . Diabetes Mother   . Heart disease Father [id number]    CHF   Social History:  reports that she quit smoking about 10 years ago. She has never used smokeless tobacco. She reports that she does not drink alcohol or use drugs.  ROS: A complete review of systems was performed.  All systems are negative except for pertinent findings as noted. Review of Systems  Musculoskeletal: Positive for back  pain.  All other systems reviewed and are negative.    Physical Exam:  Vital signs in last 24 hours: Temp:  [98.2 F (36.8 C)-99.3 F (37.4 C)] 99.3 F (37.4 C) (02/01 1645) Pulse Rate:  [57-78] 73 (02/01 1645) Resp:  [16-20] 20 (02/01 1645) BP: (152-226)/(70-92) 173/70 (02/01 1645) SpO2:  [90 %-100 %] 97 % (02/01 1645) Weight:  [91.6 kg (201 lb 15.1 oz)] 91.6 kg (201 lb 15.1 oz) (02/01 0604) General:  Alert and oriented, No acute distress HEENT: Normocephalic, atraumatic Cardiovascular: Regular rate and rhythm Lungs: Regular rate and effort Abdomen: Soft, nontender, nondistended, no abdominal masses Back: No CVA tenderness - pt points to left lower back/erector spinae  muscles - pain "deep".  Extremities: No edema Neurologic: Grossly intact  Laboratory Data:  Results for orders placed or performed during the hospital encounter of 03/13/16 (from the past 24 hour(s))  Influenza panel by PCR (type A & B)     Status: None   Collection Time: 03/13/16  8:52 PM  Result Value Ref Range   Influenza A By PCR NEGATIVE NEGATIVE   Influenza B By PCR NEGATIVE NEGATIVE  Sedimentation rate     Status: None   Collection Time: 03/13/16  9:50 PM  Result Value Ref Range   Sed Rate 3 0 - 22 mm/hr  C-reactive protein     Status: None   Collection Time: 03/13/16  9:50 PM  Result Value Ref Range   CRP <0.8 <1.0 mg/dL  Blood culture (routine x 2)     Status: None (Preliminary result)   Collection Time: 03/13/16  9:50 PM  Result Value Ref Range   Specimen Description BLOOD LEFT WRIST    Special Requests BOTTLES DRAWN AEROBIC AND ANAEROBIC    Culture      NO GROWTH < 24 HOURS Performed at [facility], [address] [place], [place] [address]    Report Status PENDING   Blood culture (routine x 2)     Status: None (Preliminary result)   Collection Time: 03/13/16 10:15 PM  Result Value Ref Range   Specimen Description BLOOD RIGHT FOREARM    Special Requests BOTTLES DRAWN AEROBIC AND ANAEROBIC    Culture      NO GROWTH < 24 HOURS Performed at [facility], [address] [place], [place] [address]    Report Status PENDING   Magnesium     Status: None   Collection Time: 03/13/16 10:15 PM  Result Value Ref Range   Magnesium 2.3 1.7 - 2.4 mg/dL  Phosphorus     Status: Abnormal   Collection Time: 03/13/16 10:15 PM  Result Value Ref Range   Phosphorus 2.1 (L) 2.5 - 4.6 mg/dL  I-Stat CG4 Lactic Acid, ED     Status: None   Collection Time: 03/13/16 10:29 PM  Result Value Ref Range   Lactic Acid, Venous 0.83 0.5 - 1.9 mmol/L  Magnesium     Status: None   Collection Time: 03/14/16  5:53 AM  Result Value Ref Range   Magnesium 2.0 1.7 - 2.4  mg/dL  Phosphorus     Status: Abnormal   Collection Time: 03/14/16  5:53 AM  Result Value Ref Range   Phosphorus 2.1 (L) 2.5 - 4.6 mg/dL  TSH     Status: None   Collection Time: 03/14/16  5:53 AM  Result Value Ref Range   TSH 1.358 0.350 - 4.500 uIU/mL  Comprehensive metabolic panel  Status: Abnormal   Collection Time: 03/14/16  5:53 AM  Result Value Ref Range   Sodium 135 135 - 145 mmol/L   Potassium 4.2 3.5 - 5.1 mmol/L   Chloride 103 101 - 111 mmol/L   CO2 24 22 - 32 mmol/L   Glucose, Bld 92 65 - 99 mg/dL   BUN 8 6 - 20 mg/dL   Creatinine, Ser 1.61 0.44 - 1.00 mg/dL   Calcium 8.0 (L) 8.9 - 10.3 mg/dL   Total Protein 5.7 (L) 6.5 - 8.1 g/dL   Albumin 3.5 3.5 - 5.0 g/dL   AST 20 15 - 41 U/L   ALT 27 14 - 54 U/L   Alkaline Phosphatase 48 38 - 126 U/L   Total Bilirubin 0.8 0.3 - 1.2 mg/dL   GFR calc non Af Amer >60 >60 mL/min   GFR calc Af Amer >60 >60 mL/min   Anion gap 8 5 - 15  CBC     Status: None   Collection Time: 03/14/16  5:53 AM  Result Value Ref Range   WBC 7.6 4.0 - 10.5 K/uL   RBC 4.15 3.87 - 5.11 MIL/uL   Hemoglobin 12.6 12.0 - 15.0 g/dL   HCT 09.6 04.5 - 40.9 %   MCV 89.9 78.0 - 100.0 fL   MCH 30.4 26.0 - 34.0 pg   MCHC 33.8 30.0 - 36.0 g/dL   RDW 81.1 91.4 - 78.2 %   Platelets 157 150 - 400 K/uL   Recent Results (from the past 240 hour(s))  Urine culture     Status: Abnormal   Collection Time: 03/11/16  3:45 AM  Result Value Ref Range Status   Specimen Description URINE, RANDOM  Final   Special Requests NONE  Final   Culture (A)  Final    <10,000 COLONIES/mL INSIGNIFICANT GROWTH Performed at [facility], [address] [place], [place] [address]    Report Status 03/12/2016 FINAL  Final  Blood culture (routine x 2)     Status: None (Preliminary result)   Collection Time: 03/13/16  9:50 PM  Result Value Ref Range Status   Specimen Description BLOOD LEFT WRIST  Final   Special Requests BOTTLES DRAWN AEROBIC AND ANAEROBIC  Final    Culture   Final    NO GROWTH < 24 HOURS Performed at [facility], [address] [place], [place] [address]    Report Status PENDING  Incomplete  Blood culture (routine x 2)     Status: None (Preliminary result)   Collection Time: 03/13/16 10:15 PM  Result Value Ref Range Status   Specimen Description BLOOD RIGHT FOREARM  Final   Special Requests BOTTLES DRAWN AEROBIC AND ANAEROBIC  Final   Culture   Final    NO GROWTH < 24 HOURS Performed at [facility], [address] [place], [place] [address]    Report Status PENDING  Incomplete   Creatinine:  Recent Labs  03/10/16 2302 03/13/16 1044 03/13/16 1047 03/14/16 0553  CREATININE 0.85 0.77 0.80 0.65    Impression/Assessment/plan:  -left flank pain  - unclear source. A stone in a calyx, not in the ureter and not causing hydro are typically incidental and asymptomatic.   -left renal stones - I drew patient a picture of the CT findings and discussed the nature, r/b/a to surveillance, URS and ESWL - all questions answered. She would like to continue surveillance. I'll see her in the office in 6 weeks or  so with a f/u KUB.  Josealfredo Adkins 03/14/2016, 6:04 PM

## 2016-03-14 NOTE — Progress Notes (Signed)
PROGRESS NOTE    Sheryl Bates  IDP:[id number] DOB: 04-24-1955 DOA: 03/13/2016 PCP: Henrine Screws, MD    Brief Narrative: Sheryl Bates is a 61 y.o. female with medical history significant of HTN endometriosis HLD who presents with nausea, vomiting since 'Sunday ( 5 days prior to admission. She is also complaining of flank pain , left side.   Assessment & Plan:   Active Problems:   Hypertension   Hyperlipidemia   Hypokalemia   Dehydration   Nausea & vomiting   Back pain  1-Nausea, Vomiting; Flank Pain; Continue with IV fluids.  Not eating, still very nauseous.  UA with trace of hb. CT renal with left renal stone, non obstructing. Urology consulted.  Had CT angio abdomen and pelvis on 1-28 without significant finding. Continue with Zofran.  Regarding MRI, patient has more flank pain than spinal pain. ESR and CRP normal. Blood culture pending. Will discuss MRI with neurosurgery.   2-HTN, severe;  Patient to received 20 Mg IV of hydralazine.  Was able to take metoprolol and Cozaar this morning.  Norvasc ordered, she might not be able to take oral Norvasc now due to nausea.  Monitor BP closely.   3-Hypophosphatemia; replaced orally. If doesnt  Tolerates it will need IV replacement.     DVT prophylaxis: SCD Code Status: Full code.  Family Communication: care discussed with husband who was at bedside.  Disposition Plan: unable to discharge patient, not tolerating very well oral intake, BP very elevated that required IV medications.   Consultants:   Urology   Procedures: None   Antimicrobials:   None    Subjective: Patient complaining of back pain, flank pain. Severe.  Patient very nauseous. Not eating.  Report she was packing and moving some boxes last week.   Objective: Vitals:   03/14/16 0618 03/14/16 0909 03/14/16 0911 03/14/16 1217  BP: (!) 182/90 (!) 188/82 (!) 180/70 (!) 226/90  Pulse:  62    Resp:  18    Temp:  99.2 F (37.3 C)      TempSrc:  Oral    SpO2:  97%    Weight:      Height:        Intake/Output Summary (Last 24 hours) at 03/14/16 1250 Last data filed at 03/14/16 0536  Gross per 24 hour  Intake           1686.5 ml  Output                0 ml  Net           16' 86.5 ml   Filed Weights   03/14/16 0604  Weight: 91.6 kg (201 lb 15.1 oz)    Examination:  General exam: Appears uncomfortable,  Respiratory system: Clear to auscultation. Respiratory effort normal. Cardiovascular system: S1 & S2 heard, RRR. No JVD, murmurs, rubs, gallops or clicks. No pedal edema. Gastrointestinal system: Abdomen is nondistended, soft and nontender. No organomegaly or masses felt. Normal bowel sounds heard. Central nervous system: Alert and oriented. No focal neurological deficits. Extremities: Symmetric 5 x 5 power. Skin: No rashes, lesions or ulcers     Data Reviewed: I have personally reviewed following labs and imaging studies  CBC:  Recent Labs Lab 03/10/16 2302 03/13/16 1044 03/13/16 1047 03/14/16 0553  WBC 7.2 8.9  --  7.6  NEUTROABS 5.1 7.2  --   --   HGB 14.5 14.3 15.0 12.6  HCT 42.3 41.5 44.0 37.3  MCV 90.8 89.1  --  89.9  PLT 216 174  --  665   Basic Metabolic Panel:  Recent Labs Lab 03/10/16 2302 03/13/16 1044 03/13/16 1047 03/13/16 2215 03/14/16 0553  NA 136 135 137  --  135  K 3.5 3.2* 3.3*  --  4.2  CL 99* 99* 97*  --  103  CO2 25 27  --   --  24  GLUCOSE 168* 113* 113*  --  92  BUN '16 12 11  ' --  8  CREATININE 0.85 0.77 0.80  --  0.65  CALCIUM 9.4 8.5*  --   --  8.0*  MG  --   --   --  2.3 2.0  PHOS  --   --   --  2.1* 2.1*   GFR: Estimated Creatinine Clearance: 78.4 mL/min (by C-G formula based on SCr of 0.65 mg/dL). Liver Function Tests:  Recent Labs Lab 03/10/16 2302 03/13/16 1044 03/14/16 0553  AST 36 24 20  ALT 43 32 27  ALKPHOS 59 54 48  BILITOT 0.6 1.2 0.8  PROT 7.0 6.4* 5.7*  ALBUMIN 4.3 4.0 3.5    Recent Labs Lab 03/11/16 0320 03/13/16 1044  LIPASE  26 26   No results for input(s): AMMONIA in the last 168 hours. Coagulation Profile: No results for input(s): INR, PROTIME in the last 168 hours. Cardiac Enzymes:  Recent Labs Lab 03/10/16 2302 03/11/16 0225  TROPONINI <0.03 <0.03   BNP (last 3 results) No results for input(s): PROBNP in the last 8760 hours. HbA1C: No results for input(s): HGBA1C in the last 72 hours. CBG: No results for input(s): GLUCAP in the last 168 hours. Lipid Profile: No results for input(s): CHOL, HDL, LDLCALC, TRIG, CHOLHDL, LDLDIRECT in the last 72 hours. Thyroid Function Tests:  Recent Labs  03/14/16 0553  TSH 1.358   Anemia Panel: No results for input(s): VITAMINB12, FOLATE, FERRITIN, TIBC, IRON, RETICCTPCT in the last 72 hours. Sepsis Labs:  Recent Labs Lab 03/10/16 2312 03/11/16 0336 03/13/16 2229  LATICACIDVEN 2.59* 1.67 0.83    Recent Results (from the past 240 hour(s))  Urine culture     Status: Abnormal   Collection Time: 03/11/16  3:45 AM  Result Value Ref Range Status   Specimen Description URINE, RANDOM  Final   Special Requests NONE  Final   Culture (A)  Final    <10,000 COLONIES/mL INSIGNIFICANT GROWTH Performed at [facility], [address] [place], [place] [address]    Report Status 03/12/2016 FINAL  Final         Radiology Studies: Mr Thoracic Spine W Wo Contrast  Result Date: 03/13/2016 CLINICAL DATA:  61 y/o F; sudden onset left-sided lower back pain. Concern for epidural abscess. EXAM: MRI THORACIC AND LUMBAR SPINE WITHOUT AND WITH CONTRAST TECHNIQUE: Multiplanar and multiecho pulse sequences of the thoracic and lumbar spine were obtained without and with intravenous contrast. CONTRAST:  60m MULTIHANCE GADOBENATE DIMEGLUMINE 529 MG/ML IV SOLN COMPARISON:  CT chest, abdomen, and pelvis dated 03/11/2016. FINDINGS: MRI THORACIC SPINE FINDINGS Alignment:  Physiologic. Vertebrae: No fracture, evidence of discitis, or bone lesion. T8 inferior endplate  nonenhancing Schmorl's node. Cord:  Normal signal and morphology. Paraspinal and other soft tissues: Negative. Disc levels: No significant disc displacement, foraminal narrowing, or canal stenosis. MRI LUMBAR SPINE FINDINGS Segmentation:  Standard. Alignment:  Physiologic. Vertebrae: There are enhancing central deformity within the an L2 superior endplate, L3 inferior end plate, at L4 superior endplate with edema and enhancement. At L2-3 there is central  disc enhancement and edema in adjacent L2 and L3 endplates with enhancement. Conus medullaris: Extends to the L1-2 level and appears normal. Paraspinal and other soft tissues: No paravertebral inflammatory changes or enhancement. Disc levels: L1-2: Small disc bulge with mild foraminal narrowing. No significant canal stenosis. L2-3: Small disc bulge with mild foraminal and lateral recess narrowing. No significant canal stenosis. L3-4: Small disc bulge with mild foraminal and lateral recess narrowing. No significant canal stenosis. L4-5: Small disc bulge and mild bilateral facet hypertrophy greater on the right. Mild foraminal and lateral recess narrowing. L5-S1: Small central protrusion and moderate right-greater-than-left facet hypertrophy. No significant foraminal narrowing and canal stenosis. No significant canal stenosis. 7 x 9 mm synovial cyst in right paraspinal muscles. IMPRESSION: 1. Enhancing central and deformity within the L2 superior endplate, L3 inferior endplate, at L4 superior endplate with edema and enhancement. At L2-3 there is central disc enhancement extending into the L2 inferior endplate central deformity and enhanced throughout the adjacent L2 and L3 endplates. On prior CT these lesions have the appearance of Schmorl's nodes. Enhancement within the L2-3 intervertebral disc could represent infection but there is no significant disc edema or surrounding soft tissue edema and infection is considered less likely. 2. Mild lumbar spondylosis with  multiple levels of mild foraminal narrowing. 3. Normal thoracic MRI.  No abnormal enhancement. Electronically Signed   By: Kristine Garbe M.D.   On: 03/13/2016 18:08   Mr Lumbar Spine W Wo Contrast  Result Date: 03/13/2016 CLINICAL DATA:  61 y/o F; sudden onset left-sided lower back pain. Concern for epidural abscess. EXAM: MRI THORACIC AND LUMBAR SPINE WITHOUT AND WITH CONTRAST TECHNIQUE: Multiplanar and multiecho pulse sequences of the thoracic and lumbar spine were obtained without and with intravenous contrast. CONTRAST:  96m MULTIHANCE GADOBENATE DIMEGLUMINE 529 MG/ML IV SOLN COMPARISON:  CT chest, abdomen, and pelvis dated 03/11/2016. FINDINGS: MRI THORACIC SPINE FINDINGS Alignment:  Physiologic. Vertebrae: No fracture, evidence of discitis, or bone lesion. T8 inferior endplate nonenhancing Schmorl's node. Cord:  Normal signal and morphology. Paraspinal and other soft tissues: Negative. Disc levels: No significant disc displacement, foraminal narrowing, or canal stenosis. MRI LUMBAR SPINE FINDINGS Segmentation:  Standard. Alignment:  Physiologic. Vertebrae: There are enhancing central deformity within the an L2 superior endplate, L3 inferior end plate, at L4 superior endplate with edema and enhancement. At L2-3 there is central disc enhancement and edema in adjacent L2 and L3 endplates with enhancement. Conus medullaris: Extends to the L1-2 level and appears normal. Paraspinal and other soft tissues: No paravertebral inflammatory changes or enhancement. Disc levels: L1-2: Small disc bulge with mild foraminal narrowing. No significant canal stenosis. L2-3: Small disc bulge with mild foraminal and lateral recess narrowing. No significant canal stenosis. L3-4: Small disc bulge with mild foraminal and lateral recess narrowing. No significant canal stenosis. L4-5: Small disc bulge and mild bilateral facet hypertrophy greater on the right. Mild foraminal and lateral recess narrowing. L5-S1: Small  central protrusion and moderate right-greater-than-left facet hypertrophy. No significant foraminal narrowing and canal stenosis. No significant canal stenosis. 7 x 9 mm synovial cyst in right paraspinal muscles. IMPRESSION: 1. Enhancing central and deformity within the L2 superior endplate, L3 inferior endplate, at L4 superior endplate with edema and enhancement. At L2-3 there is central disc enhancement extending into the L2 inferior endplate central deformity and enhanced throughout the adjacent L2 and L3 endplates. On prior CT these lesions have the appearance of Schmorl's nodes. Enhancement within the L2-3 intervertebral disc could represent infection but there  is no significant disc edema or surrounding soft tissue edema and infection is considered less likely. 2. Mild lumbar spondylosis with multiple levels of mild foraminal narrowing. 3. Normal thoracic MRI.  No abnormal enhancement. Electronically Signed   By: Kristine Garbe M.D.   On: 03/13/2016 18:08   Ct L-spine No Charge  Result Date: 03/13/2016 CLINICAL DATA:  Neck pain, LEFT flank pain since Sunday EXAM: CT LUMBAR SPINE WITHOUT CONTRAST TECHNIQUE: Multidetector CT imaging of the lumbar spine was performed without intravenous contrast administration. Multiplanar CT image reconstructions were also generated. COMPARISON:  CT abdomen/pelvis 03/10/2016 FINDINGS: Segmentation: Exam labeled with 5 non-rib-bearing lumbar vertebra. Alignment: Normal Vertebrae: Schmorl's nodes at the superior inferior endplates of L2 and L3 as well as the superior endplates of L4 and L5. Mild facet degenerative changes in lower lumbar spine. Probable discogenic sclerosis in the L2 and L3 vertebral bodies, minimally surrounding Schmorl's node at superior endplate L4 as well. Vertebral body heights maintained without fracture or bone destruction. SI joints preserved. Paraspinal and other soft tissues: Atherosclerotic calcification aorta. Nonobstructing lower pole  LEFT renal calculus at margin of exam please refer to CT abdomen report. No other soft tissue abnormalities. Disc levels: Disc space narrowing at L1-L2, L2-L3, less at remaining lumbar levels. Diffusely bulging discs at throughout the lumbar region from L1-L2 through L5-S1 without definite focal disc herniation or neural compression. IMPRESSION: Degenerative disc and facet disease changes of the lumbar spine as above. Probable discogenic sclerosis at L2 and L3 vertebral bodies. No definite acute lumbar spine abnormalities. Nonobstructing LEFT renal calculus. Electronically Signed   By: Lavonia Dana M.D.   On: 03/13/2016 11:51   Ct Renal Stone Study  Result Date: 03/13/2016 CLINICAL DATA:  LEFT flank pain since Sunday, nausea, vomiting, hypertension EXAM: CT ABDOMEN AND PELVIS WITHOUT CONTRAST TECHNIQUE: Multidetector CT imaging of the abdomen and pelvis was performed following the standard protocol without IV contrast. Sagittal and coronal MPR images reconstructed from axial data set. Oral contrast not administered for this indication. COMPARISON:  03/10/2016 FINDINGS: Lower chest: Minimal dependent atelectasis Hepatobiliary: Fatty infiltration of liver. Gallbladder and liver otherwise unremarkable. Pancreas: Normal appearance Spleen: Normal appearance Adrenals/Urinary Tract: Thickened adrenal glands without discrete mass. Unremarkable RIGHT kidney, RIGHT ureter in urinary bladder. Nonobstructing LEFT renal calculi largest 6 mm diameter. No LEFT hydronephrosis, LEFT hydroureter or LEFT ureteral calcification. Stomach/Bowel: Normal appendix. Diverticulosis of descending and sigmoid colon without pericolic inflammatory changes to suggest acute diverticulitis. Small hiatal hernia. Stomach and small bowel loops otherwise unremarkable. Vascular/Lymphatic: Atherosclerotic calcifications aorta and iliac arteries. Aorta normal caliber. No adenopathy. Reproductive: Unremarkable uterus and adnexa Other: No free air free  fluid.  No hernia or inflammatory process Musculoskeletal: Schmorl's nodes lumbar spine at L2-L5. Mild sclerosis of the L2 and L3 vertebral bodies, see dedicated CT lumbar spine exam. IMPRESSION: Nonobstructing LEFT renal calculi without evidence of hydronephrosis or hydroureter. Hepatic steatosis. Distal colonic diverticulosis. Small hiatal hernia. Aortic atherosclerosis. Degenerative disc disease changes lumbar spine with mild sclerosis in the L2 and L3 vertebral bodies, please refer to dedicated CT lumbar spine report. Electronically Signed   By: Lavonia Dana M.D.   On: 03/13/2016 11:46        Scheduled Meds: . amLODipine  5 mg Oral Daily  . citalopram  20 mg Oral Daily  . gabapentin  100 mg Oral QHS  . hydrALAZINE      . losartan  100 mg Oral Daily  . metoprolol  50 mg Oral Q breakfast  Continuous Infusions: . sodium chloride       LOS: 0 days    Time spent: 35 minutes.     Elmarie Shiley, MD [facility] Pager [phone]  If 7PM-7AM, please contact night-coverage [email or web address] Password [email or web address] 03/14/2016, 12:50 PM

## 2016-03-14 NOTE — Progress Notes (Signed)
OT Cancellation Note  Patient Details Name: Sheryl Bates MRN: [id number] DOB: 12/20/1955   Cancelled Treatment:    Reason Eval/Treat Not Completed: Medical issues which prohibited therapy. Pt with increased BP this a.m. RN also requests therapies to hold until BP is better  Galen ManilaSpencer, Natashia Roseman Jeanette 03/14/2016, 11:07 AM

## 2016-03-15 ENCOUNTER — Inpatient Hospital Stay (HOSPITAL_COMMUNITY): Payer: BLUE CROSS/BLUE SHIELD

## 2016-03-15 ENCOUNTER — Encounter (HOSPITAL_COMMUNITY): Payer: Self-pay | Admitting: Diagnostic Radiology

## 2016-03-15 HISTORY — PX: IR GENERIC HISTORICAL: IMG1180011

## 2016-03-15 LAB — BASIC METABOLIC PANEL
Anion gap: 7 (ref 5–15)
BUN: 8 mg/dL (ref 6–20)
CO2: 27 mmol/L (ref 22–32)
Calcium: 8.4 mg/dL — ABNORMAL LOW (ref 8.9–10.3)
Chloride: 104 mmol/L (ref 101–111)
Creatinine, Ser: 0.65 mg/dL (ref 0.44–1.00)
GFR calc Af Amer: >60 mL/min (ref >60)
GFR calc non Af Amer: >60 mL/min (ref >60)
Glucose, Bld: 118 mg/dL — ABNORMAL HIGH (ref 65–99)
Potassium: 3.3 mmol/L — ABNORMAL LOW (ref 3.5–5.1)
Sodium: 138 mmol/L (ref 135–145)

## 2016-03-15 LAB — CBC
HCT: 40.6 % (ref 36.0–46.0)
Hemoglobin: 14 g/dL (ref 12.0–15.0)
MCH: 30.4 pg (ref 26.0–34.0)
MCHC: 34.5 g/dL (ref 30.0–36.0)
MCV: 88.1 fL (ref 78.0–100.0)
Platelets: 163 10*3/uL (ref 150–400)
RBC: 4.61 MIL/uL (ref 3.87–5.11)
RDW: 12 % (ref 11.5–15.5)
WBC: 7.4 10*3/uL (ref 4.0–10.5)

## 2016-03-15 MED ORDER — ENSURE ENLIVE PO LIQD
237.0000 mL | Freq: Two times a day (BID) | ORAL | Status: DC
  Administered 2016-03-16 – 2016-03-17 (×2): 237 mL via ORAL

## 2016-03-15 MED ORDER — METHYLPREDNISOLONE ACETATE 80 MG/ML IJ SUSP
INTRAMUSCULAR | Status: AC
  Filled 2016-03-15: qty 1

## 2016-03-15 MED ORDER — METHYLPREDNISOLONE ACETATE 40 MG/ML IJ SUSP
INTRAMUSCULAR | Status: AC
  Filled 2016-03-15: qty 1

## 2016-03-15 MED ORDER — PROMETHAZINE HCL 25 MG/ML IJ SOLN
6.2500 mg | Freq: Four times a day (QID) | INTRAMUSCULAR | Status: DC | PRN
  Administered 2016-03-15 – 2016-03-16 (×3): 6.25 mg via INTRAVENOUS
  Filled 2016-03-15 (×3): qty 1

## 2016-03-15 MED ORDER — SODIUM CHLORIDE 0.9 % IV SOLN
30.0000 meq | Freq: Once | INTRAVENOUS | Status: AC
  Administered 2016-03-15 (×2): 30 meq via INTRAVENOUS
  Filled 2016-03-15: qty 15

## 2016-03-15 MED ORDER — IOPAMIDOL (ISOVUE-M 200) INJECTION 41%
3.0000 mL | Freq: Once | INTRAMUSCULAR | Status: AC
  Administered 2016-03-15: 3 mL via EPIDURAL

## 2016-03-15 MED ORDER — LIDOCAINE HCL (PF) 1 % IJ SOLN
INTRAMUSCULAR | Status: AC
  Filled 2016-03-15: qty 30

## 2016-03-15 MED ORDER — HYDRALAZINE HCL 20 MG/ML IJ SOLN
20.0000 mg | Freq: Four times a day (QID) | INTRAMUSCULAR | Status: DC | PRN
  Administered 2016-03-15 – 2016-03-16 (×2): 20 mg via INTRAVENOUS
  Filled 2016-03-15 (×2): qty 1

## 2016-03-15 MED ORDER — IOPAMIDOL (ISOVUE-M 200) INJECTION 41%
INTRAMUSCULAR | Status: AC
  Administered 2016-03-15: 3 mL via EPIDURAL
  Filled 2016-03-15: qty 10

## 2016-03-15 NOTE — Progress Notes (Signed)
OT Cancellation Note  Patient Details Name: Sheryl HoppingRae Michele Bates MRN: [id number] DOB: 08/30/1955   Cancelled Treatment:    Reason Eval/Treat Not Completed: Medical issues which prohibited therapy;Other (comment). Pt having nausea and IV RN with pt. Will re attempt later today as able  Galen ManilaSpencer, Teddy Pena Jeanette 03/15/2016, 9:45 AM

## 2016-03-15 NOTE — Progress Notes (Signed)
PT Cancellation Note  Patient Details Name: Sheryl Bates MRN: [id number] DOB: 02/04/1956   Cancelled Treatment:    Reason Eval/Treat Not Completed: Medical issues which prohibited therapy (per OT note for cancellation.)   Rada HayHill, Janice Bodine Elizabeth 03/15/2016, 1:24 PM Blanchard KelchKaren Casandra Dallaire PT [phone]

## 2016-03-15 NOTE — Progress Notes (Signed)
[facility] radi[facility] as no other radiologist are here who do this procedure today to perform the ESI  Charlann Wayne E

## 2016-03-15 NOTE — Procedures (Signed)
Lumbar eppidural left L3-4 using 20 gauge needle.  120 mg Depomedrol and 3cc 1% lidocaine instilled.  No complications or difficulties.  Well tolerated.  Expected time courses of the anesthetic and steroid effects discussed with patient.  No specific post procedure orders, as pt is on bedrest.

## 2016-03-15 NOTE — Progress Notes (Signed)
PROGRESS NOTE    Sheryl Bates  KOE:[id number] DOB: 07-Apr-1955 DOA: 03/13/2016 PCP: Henrine Screws, MD    Brief Narrative: Sheryl Bates is a 61 y.o. female with medical history significant of HTN endometriosis HLD who presents with nausea, vomiting since 'Sunday ( 5 days prior to admission. She is also complaining of flank pain , left side.   Assessment & Plan:   Active Problems:   Hypertension   Hyperlipidemia   Hypokalemia   Dehydration   Nausea & vomiting   Back pain  1-Nausea, Vomiting; Flank Pain; Continue with IV fluids.  Not eating, still very nauseous.  UA with trace of hb. CT renal with left renal stone, non obstructing. Urology consulted. Stone does not explain patient pain.  Had CT angio abdomen and pelvis on 1-28 without significant finding. Continue with Zofran. Add phenergan.  MRI results as below.  ESR and CRP normal. Blood culture no growth 24 hours.  Appreciate Dr Elsner help. Plan for steroid injection today.   2-HTN, severe;  Patient to received 20 Mg IV of hydralazine.  Continue with  metoprolol and Cozaar, norvasc.    3-Hypophosphatemia; replaced IV. Repeat labs in am.     DVT prophylaxis: SCD Code Status: Full code.  Family Communication: care discussed with husband who was at bedside.  Disposition Plan: unable to discharge patient, not tolerating diet, plan for steroid injection, requiring IV pain medication for pain controlled.   Consultants:   Urology   Dr Elsner.   Procedures: None   Antimicrobials:   None    Subjective: Still with back pain, still very nauseous, not eating.  She report that when she had pain in the past , she get severe nausea with pain. Denies abdominal pain.   Objective: Vitals:   03/14/16 1427 03/14/16 1645 03/14/16 2018 03/15/16 0432  BP: (!) 180/80 (!) 173/70 (!) 152/68 (!) 154/80  Pulse: 78 73 71 (!) 59  Resp: 20 20 20 16  Temp: 98.6 F (37 C) 99.3 F (37.4 C) 99 F (37.2 C) 98.4 F  (36.9 C)  TempSrc: Oral Oral Oral Oral  SpO2: 100% 97% 96% 97%  Weight:      Height:        Intake/Output Summary (Last 24 hours) at 03/15/16 1333 Last data filed at 03/15/16 0600  Gross per 24 hour  Intake             1135 ml  Output                0 ml  Net             11' 35 ml   Filed Weights   03/14/16 0604  Weight: 91.6 kg (201 lb 15.1 oz)    Examination:  General exam: Appears uncomfortable,  Respiratory system: Clear to auscultation. Respiratory effort normal. Cardiovascular system: S1 & S2 heard, RRR. No JVD, murmurs, rubs, gallops or clicks. No pedal edema. Gastrointestinal system: Abdomen is nondistended, soft and nontender. No organomegaly or masses felt. Normal bowel sounds heard. Central nervous system: Alert and oriented. No focal neurological deficits. Extremities: Symmetric 5 x 5 power. Skin: No rashes, lesions or ulcers     Data Reviewed: I have personally reviewed following labs and imaging studies  CBC:  Recent Labs Lab 03/10/16 2302 03/13/16 1044 03/13/16 1047 03/14/16 0553 03/15/16 0819  WBC 7.2 8.9  --  7.6 7.4  NEUTROABS 5.1 7.2  --   --   --   HGB 14.5 14.3  15.0 12.6 14.0  HCT 42.3 41.5 44.0 37.3 40.6  MCV 90.8 89.1  --  89.9 88.1  PLT 216 174  --  157 433   Basic Metabolic Panel:  Recent Labs Lab 03/10/16 2302 03/13/16 1044 03/13/16 1047 03/13/16 2215 03/14/16 0553 03/15/16 0819  NA 136 135 137  --  135 138  K 3.5 3.2* 3.3*  --  4.2 3.3*  CL 99* 99* 97*  --  103 104  CO2 25 27  --   --  24 27  GLUCOSE 168* 113* 113*  --  92 118*  BUN '16 12 11  ' --  8 8  CREATININE 0.85 0.77 0.80  --  0.65 0.65  CALCIUM 9.4 8.5*  --   --  8.0* 8.4*  MG  --   --   --  2.3 2.0  --   PHOS  --   --   --  2.1* 2.1*  --    GFR: Estimated Creatinine Clearance: 78.4 mL/min (by C-G formula based on SCr of 0.65 mg/dL). Liver Function Tests:  Recent Labs Lab 03/10/16 2302 03/13/16 1044 03/14/16 0553  AST 36 24 20  ALT 43 32 27  ALKPHOS 59  54 48  BILITOT 0.6 1.2 0.8  PROT 7.0 6.4* 5.7*  ALBUMIN 4.3 4.0 3.5    Recent Labs Lab 03/11/16 0320 03/13/16 1044  LIPASE 26 26   No results for input(s): AMMONIA in the last 168 hours. Coagulation Profile: No results for input(s): INR, PROTIME in the last 168 hours. Cardiac Enzymes:  Recent Labs Lab 03/10/16 2302 03/11/16 0225  TROPONINI <0.03 <0.03   BNP (last 3 results) No results for input(s): PROBNP in the last 8760 hours. HbA1C: No results for input(s): HGBA1C in the last 72 hours. CBG: No results for input(s): GLUCAP in the last 168 hours. Lipid Profile: No results for input(s): CHOL, HDL, LDLCALC, TRIG, CHOLHDL, LDLDIRECT in the last 72 hours. Thyroid Function Tests:  Recent Labs  03/14/16 0553  TSH 1.358   Anemia Panel: No results for input(s): VITAMINB12, FOLATE, FERRITIN, TIBC, IRON, RETICCTPCT in the last 72 hours. Sepsis Labs:  Recent Labs Lab 03/10/16 2312 03/11/16 0336 03/13/16 2229  LATICACIDVEN 2.59* 1.67 0.83    Recent Results (from the past 240 hour(s))  Urine culture     Status: Abnormal   Collection Time: 03/11/16  3:45 AM  Result Value Ref Range Status   Specimen Description URINE, RANDOM  Final   Special Requests NONE  Final   Culture (A)  Final    <10,000 COLONIES/mL INSIGNIFICANT GROWTH Performed at [facility], [address] [place], [place] [address]    Report Status 03/12/2016 FINAL  Final  Blood culture (routine x 2)     Status: None (Preliminary result)   Collection Time: 03/13/16  9:50 PM  Result Value Ref Range Status   Specimen Description BLOOD LEFT WRIST  Final   Special Requests BOTTLES DRAWN AEROBIC AND ANAEROBIC 5ML  Final   Culture   Final    NO GROWTH < 24 HOURS Performed at [facility], [place] [address], [place], [place] [address]    Report Status PENDING  Incomplete  Blood culture (routine x 2)     Status: None (Preliminary result)   Collection Time: 03/13/16 10:15 PM  Result Value Ref  Range Status   Specimen Description BLOOD RIGHT FOREARM  Final   Special Requests BOTTLES DRAWN AEROBIC AND ANAEROBIC 5ML  Final  Culture   Final    NO GROWTH < 24 HOURS Performed at [facility], [place] [address], [place], [place] [address]    Report Status PENDING  Incomplete         Radiology Studies: Mr Thoracic Spine W Wo Contrast  Result Date: 03/13/2016 CLINICAL DATA:  61 y/o F; sudden onset left-sided lower back pain. Concern for epidural abscess. EXAM: MRI THORACIC AND LUMBAR SPINE WITHOUT AND WITH CONTRAST TECHNIQUE: Multiplanar and multiecho pulse sequences of the thoracic and lumbar spine were obtained without and with intravenous contrast. CONTRAST:  46m MULTIHANCE GADOBENATE DIMEGLUMINE 529 MG/ML IV SOLN COMPARISON:  CT chest, abdomen, and pelvis dated 03/11/2016. FINDINGS: MRI THORACIC SPINE FINDINGS Alignment:  Physiologic. Vertebrae: No fracture, evidence of discitis, or bone lesion. T8 inferior endplate nonenhancing Schmorl's node. Cord:  Normal signal and morphology. Paraspinal and other soft tissues: Negative. Disc levels: No significant disc displacement, foraminal narrowing, or canal stenosis. MRI LUMBAR SPINE FINDINGS Segmentation:  Standard. Alignment:  Physiologic. Vertebrae: There are enhancing central deformity within the an L2 superior endplate, L3 inferior end plate, at L4 superior endplate with edema and enhancement. At L2-3 there is central disc enhancement and edema in adjacent L2 and L3 endplates with enhancement. Conus medullaris: Extends to the L1-2 level and appears normal. Paraspinal and other soft tissues: No paravertebral inflammatory changes or enhancement. Disc levels: L1-2: Small disc bulge with mild foraminal narrowing. No significant canal stenosis. L2-3: Small disc bulge with mild foraminal and lateral recess narrowing. No significant canal stenosis. L3-4: Small disc bulge with mild foraminal and lateral recess narrowing. No significant canal  stenosis. L4-5: Small disc bulge and mild bilateral facet hypertrophy greater on the right. Mild foraminal and lateral recess narrowing. L5-S1: Small central protrusion and moderate right-greater-than-left facet hypertrophy. No significant foraminal narrowing and canal stenosis. No significant canal stenosis. 7 x 9 mm synovial cyst in right paraspinal muscles. IMPRESSION: 1. Enhancing central and deformity within the L2 superior endplate, L3 inferior endplate, at L4 superior endplate with edema and enhancement. At L2-3 there is central disc enhancement extending into the L2 inferior endplate central deformity and enhanced throughout the adjacent L2 and L3 endplates. On prior CT these lesions have the appearance of Schmorl's nodes. Enhancement within the L2-3 intervertebral disc could represent infection but there is no significant disc edema or surrounding soft tissue edema and infection is considered less likely. 2. Mild lumbar spondylosis with multiple levels of mild foraminal narrowing. 3. Normal thoracic MRI.  No abnormal enhancement. Electronically Signed   By: LKristine GarbeM.D.   On: 03/13/2016 18:08   Mr Lumbar Spine W Wo Contrast  Result Date: 03/13/2016 CLINICAL DATA:  61y/o F; sudden onset left-sided lower back pain. Concern for epidural abscess. EXAM: MRI THORACIC AND LUMBAR SPINE WITHOUT AND WITH CONTRAST TECHNIQUE: Multiplanar and multiecho pulse sequences of the thoracic and lumbar spine were obtained without and with intravenous contrast. CONTRAST:  174mMULTIHANCE GADOBENATE DIMEGLUMINE 529 MG/ML IV SOLN COMPARISON:  CT chest, abdomen, and pelvis dated 03/11/2016. FINDINGS: MRI THORACIC SPINE FINDINGS Alignment:  Physiologic. Vertebrae: No fracture, evidence of discitis, or bone lesion. T8 inferior endplate nonenhancing Schmorl's node. Cord:  Normal signal and morphology. Paraspinal and other soft tissues: Negative. Disc levels: No significant disc displacement, foraminal narrowing,  or canal stenosis. MRI LUMBAR SPINE FINDINGS Segmentation:  Standard. Alignment:  Physiologic. Vertebrae: There are enhancing central deformity within the an L2 superior endplate, L3 inferior end plate, at L4 superior endplate with  edema and enhancement. At L2-3 there is central disc enhancement and edema in adjacent L2 and L3 endplates with enhancement. Conus medullaris: Extends to the L1-2 level and appears normal. Paraspinal and other soft tissues: No paravertebral inflammatory changes or enhancement. Disc levels: L1-2: Small disc bulge with mild foraminal narrowing. No significant canal stenosis. L2-3: Small disc bulge with mild foraminal and lateral recess narrowing. No significant canal stenosis. L3-4: Small disc bulge with mild foraminal and lateral recess narrowing. No significant canal stenosis. L4-5: Small disc bulge and mild bilateral facet hypertrophy greater on the right. Mild foraminal and lateral recess narrowing. L5-S1: Small central protrusion and moderate right-greater-than-left facet hypertrophy. No significant foraminal narrowing and canal stenosis. No significant canal stenosis. 7 x 9 mm synovial cyst in right paraspinal muscles. IMPRESSION: 1. Enhancing central and deformity within the L2 superior endplate, L3 inferior endplate, at L4 superior endplate with edema and enhancement. At L2-3 there is central disc enhancement extending into the L2 inferior endplate central deformity and enhanced throughout the adjacent L2 and L3 endplates. On prior CT these lesions have the appearance of Schmorl's nodes. Enhancement within the L2-3 intervertebral disc could represent infection but there is no significant disc edema or surrounding soft tissue edema and infection is considered less likely. 2. Mild lumbar spondylosis with multiple levels of mild foraminal narrowing. 3. Normal thoracic MRI.  No abnormal enhancement. Electronically Signed   By: Kristine Garbe M.D.   On: 03/13/2016 18:08         Scheduled Meds: . amLODipine  5 mg Oral Daily  . citalopram  20 mg Oral Daily  . feeding supplement (ENSURE ENLIVE)  237 mL Oral BID BM  . gabapentin  100 mg Oral QHS  . losartan  100 mg Oral Daily  . metoprolol  50 mg Oral Q breakfast  . [COMPLETED] potassium chloride (KCL MULTIRUN) 30 mEq in 265 mL IVPB  30 mEq Intravenous Once   Continuous Infusions: . sodium chloride 100 mL/hr at 03/15/16 1018     LOS: 1 day    Time spent: 35 minutes.     Elmarie Shiley, MD [facility] Pager [phone]  If 7PM-7AM, please contact night-coverage [email or web address] Password [email or web address] 03/15/2016, 1:33 PM

## 2016-03-15 NOTE — Progress Notes (Signed)
Aware of request for epidural spine injection.  Unfortunately the IR doc nor any of the other radiologist here at [facility] do epidural spine injections.  The docs that are on call over the weekend do not perform them as well.  This can be arranged as an outpatient at the Spine center.  I have discussed this with Dr. Sunnie Nielsenegalado and given her the number to the spine center.  Rondell Frick E 8:49 AM 03/15/2016

## 2016-03-16 ENCOUNTER — Inpatient Hospital Stay (HOSPITAL_COMMUNITY): Payer: BLUE CROSS/BLUE SHIELD

## 2016-03-16 LAB — CBC
HCT: 40.5 % (ref 36.0–46.0)
Hemoglobin: 13.2 g/dL (ref 12.0–15.0)
MCH: 29.4 pg (ref 26.0–34.0)
MCHC: 32.6 g/dL (ref 30.0–36.0)
MCV: 90.2 fL (ref 78.0–100.0)
Platelets: 177 10*3/uL (ref 150–400)
RBC: 4.49 MIL/uL (ref 3.87–5.11)
RDW: 12.1 % (ref 11.5–15.5)
WBC: 7.2 10*3/uL (ref 4.0–10.5)

## 2016-03-16 LAB — BASIC METABOLIC PANEL
Anion gap: 9 (ref 5–15)
BUN: 8 mg/dL (ref 6–20)
CO2: 24 mmol/L (ref 22–32)
Calcium: 8.6 mg/dL — ABNORMAL LOW (ref 8.9–10.3)
Chloride: 103 mmol/L (ref 101–111)
Creatinine, Ser: 0.62 mg/dL (ref 0.44–1.00)
GFR calc Af Amer: >60 mL/min (ref >60)
GFR calc non Af Amer: >60 mL/min (ref >60)
Glucose, Bld: 108 mg/dL — ABNORMAL HIGH (ref 65–99)
Potassium: 3.8 mmol/L (ref 3.5–5.1)
Sodium: 136 mmol/L (ref 135–145)

## 2016-03-16 LAB — TROPONIN I
Troponin I: 0.05 ng/mL (ref <0.03)
Troponin I: 0.04 ng/mL (ref <0.03)
Troponin I: 0.03 ng/mL (ref <0.03)

## 2016-03-16 LAB — PHOSPHORUS: Phosphorus: 2.1 mg/dL — ABNORMAL LOW (ref 2.5–4.6)

## 2016-03-16 MED ORDER — SENNOSIDES-DOCUSATE SODIUM 8.6-50 MG PO TABS
1.0000 | ORAL_TABLET | Freq: Two times a day (BID) | ORAL | Status: DC
  Administered 2016-03-16 – 2016-03-21 (×7): 1 via ORAL
  Filled 2016-03-16 (×12): qty 1

## 2016-03-16 MED ORDER — AMLODIPINE BESYLATE 5 MG PO TABS
5.0000 mg | ORAL_TABLET | Freq: Once | ORAL | Status: AC
  Administered 2016-03-16: 5 mg via ORAL
  Filled 2016-03-16: qty 1

## 2016-03-16 MED ORDER — PANTOPRAZOLE SODIUM 40 MG PO TBEC
40.0000 mg | DELAYED_RELEASE_TABLET | Freq: Two times a day (BID) | ORAL | Status: DC
  Administered 2016-03-16 – 2016-03-22 (×12): 40 mg via ORAL
  Filled 2016-03-16 (×13): qty 1

## 2016-03-16 MED ORDER — HYDRALAZINE HCL 20 MG/ML IJ SOLN
10.0000 mg | Freq: Four times a day (QID) | INTRAMUSCULAR | Status: DC | PRN
  Administered 2016-03-18 – 2016-03-19 (×2): 10 mg via INTRAVENOUS
  Filled 2016-03-16: qty 1

## 2016-03-16 MED ORDER — HYDROCHLOROTHIAZIDE 25 MG PO TABS
25.0000 mg | ORAL_TABLET | Freq: Every day | ORAL | Status: DC
  Administered 2016-03-16 – 2016-03-22 (×6): 25 mg via ORAL
  Filled 2016-03-16 (×7): qty 1

## 2016-03-16 MED ORDER — AMLODIPINE BESYLATE 10 MG PO TABS
10.0000 mg | ORAL_TABLET | Freq: Every day | ORAL | Status: DC
  Administered 2016-03-17 – 2016-03-22 (×5): 10 mg via ORAL
  Filled 2016-03-16 (×6): qty 1

## 2016-03-16 MED ORDER — K PHOS MONO-SOD PHOS DI & MONO 155-852-130 MG PO TABS
500.0000 mg | ORAL_TABLET | Freq: Two times a day (BID) | ORAL | Status: AC
  Administered 2016-03-16 – 2016-03-17 (×3): 500 mg via ORAL
  Filled 2016-03-16 (×4): qty 2

## 2016-03-16 MED ORDER — HYDRALAZINE HCL 25 MG PO TABS
25.0000 mg | ORAL_TABLET | Freq: Three times a day (TID) | ORAL | Status: DC
  Administered 2016-03-16 – 2016-03-22 (×15): 25 mg via ORAL
  Filled 2016-03-16 (×16): qty 1

## 2016-03-16 NOTE — Progress Notes (Signed)
OT Cancellation Note  Patient Details Name: Sheryl Bates MRN: [id number] DOB: 10/18/1955   Cancelled Treatment:    Reason Eval/Treat Not Completed: Other (comment)   Noted pt was not ready for PT earlier and troponin is increased. Will check back tomorrow or Monday, as schedule permits. Kaemon Barnett 03/16/2016, 2:03 PM  Marica OtterMaryellen Mayukha Symmonds, OTR/L [phone] 03/16/2016

## 2016-03-16 NOTE — Progress Notes (Signed)
PT Cancellation Note  Patient Details Name: Sheryl HoppingRae Michele Nevel MRN: [id number] DOB: 05/11/1955   Cancelled Treatment:    Reason Eval/Treat Not Completed: Other (comment). Nursing reports pt not appropriate for PT today, will be going down later for MRI as well. Nursing requests possible check back tomorrow.    Christiane HaBenjamin J. Roemello Speyer, PT, CSCS Pager [phone] Office [phone]  03/16/2016, 10:08 AM

## 2016-03-16 NOTE — Progress Notes (Addendum)
PROGRESS NOTE    Sheryl Bates  TRZ:[id number] DOB: 1955/09/13 DOA: 03/13/2016 PCP: Henrine Screws, MD    Brief Narrative: Sheryl Bates is a 61 y.o. female with medical history significant of HTN endometriosis HLD who presents with nausea, vomiting since "Sunday ( 5 days prior to admission. She is also complaining of flank pain , left side.   Assessment & Plan:   Active Problems:   Hypertension   Hyperlipidemia   Hypokalemia   Dehydration   Nausea & vomiting   Back pain  1-Nausea, Vomiting; Flank Pain; Not eating well.  UA with trace of hb. CT renal with left renal stone, non obstructing. Urology consulted. Stone does not explain patient pain.  Had CT angio abdomen and pelvis on 1-28 without significant finding. Continue with Zofran. Add phenergan.  MRI results as below.  ESR and CRP normal. Blood culture no growth 24 hours.  Appreciate Dr Elsner help.  Underwent steroids injection 2-02. Notice mild improvement of pain, not significant.  Still with nausea. Check MRI, controlled BP    2-HTN, severe;  IV PRN hydralazine.  Continue with  metoprolol and Cozaar, norvasc. Increase norvasc.  NSL fluids./  Resume HCTZ.  Will check MRI brain.   3-Generalized weakness, ,mild headaches, not feeling well; Denies chest pain. Check EKG, troponin, MRI brain.  TSH normal/  Also felt worse after she received phenergan and IV morphine this am. Will stop phenergan.  Troponin mild elevated, continue to cycle. If continue to increase above 1 will need to call cardiology back.  Check ECHO  Hypophosphatemia; replaced orally.     DVT prophylaxis: SCD Code Status: Full code.  Family Communication: care discussed with husband who was at bedside 2-02 Disposition Plan: unable to discharge patient, not tolerating diet, HTN.  Consultants:   Urology   Dr Elsner.   Procedures: None   Antimicrobials:   None    Subjective: She keeps eyes close, has towel in forehead.    Report mild headaches. Still with nausea.  Flank pain mild improvement.  This morning felt really bad after she received phenergan nd morphine, eyes couldn't focus, her vision is normal now.   Objective: Vitals:   03/16/16 0509 03/16/16 0715 03/16/16 0738 03/16/16 0918  BP: (!) 233/95 (!) 200/80 (!) 190/57 (!) 194/76  Pulse: 73  72 70  Resp: 17   20  Temp: 98.9 F (37.2 C)   98.6 F (37 C)  TempSrc: Oral   Oral  SpO2: 100%   97%  Weight:      Height:        Intake/Output Summary (Last 24 hours) at 03/16/16 0947 Last data filed at 03/15/16 1100  Gross per 24 hour  Intake              240 ml  Output                0 ml  Net              24" 0 ml   Filed Weights   03/14/16 0604  Weight: 91.6 kg (201 lb 15.1 oz)    Examination:  General exam: keep eyes close.  Respiratory system: Clear to auscultation. Respiratory effort normal. Cardiovascular system: S1 & S2 heard, RRR. No JVD, murmurs, rubs, gallops or clicks. No pedal edema. Gastrointestinal system: Abdomen is nondistended, soft and nontender. No organomegaly or masses felt. Normal bowel sounds heard. Central nervous system: Alert and oriented. No focal neurological deficits. Extremities: Symmetric 5 x  5 power. Skin: No rashes, lesions or ulcers     Data Reviewed: I have personally reviewed following labs and imaging studies  CBC:  Recent Labs Lab 03/10/16 2302 03/13/16 1044 03/13/16 1047 03/14/16 0553 03/15/16 0819 03/16/16 0725  WBC 7.2 8.9  --  7.6 7.4 7.2  NEUTROABS 5.1 7.2  --   --   --   --   HGB 14.5 14.3 15.0 12.6 14.0 13.2  HCT 42.3 41.5 44.0 37.3 40.6 40.5  MCV 90.8 89.1  --  89.9 88.1 90.2  PLT 216 174  --  157 163 294   Basic Metabolic Panel:  Recent Labs Lab 03/10/16 2302 03/13/16 1044 03/13/16 1047 03/13/16 2215 03/14/16 0553 03/15/16 0819 03/16/16 0725  NA 136 135 137  --  135 138 136  K 3.5 3.2* 3.3*  --  4.2 3.3* 3.8  CL 99* 99* 97*  --  103 104 103  CO2 25 27  --   --  '24  27 24  ' GLUCOSE 168* 113* 113*  --  92 118* 108*  BUN '16 12 11  ' --  '8 8 8  ' CREATININE 0.85 0.77 0.80  --  0.65 0.65 0.62  CALCIUM 9.4 8.5*  --   --  8.0* 8.4* 8.6*  MG  --   --   --  2.3 2.0  --   --   PHOS  --   --   --  2.1* 2.1*  --  2.1*   GFR: Estimated Creatinine Clearance: 78.4 mL/min (by C-G formula based on SCr of 0.62 mg/dL). Liver Function Tests:  Recent Labs Lab 03/10/16 2302 03/13/16 1044 03/14/16 0553  AST 36 24 20  ALT 43 32 27  ALKPHOS 59 54 48  BILITOT 0.6 1.2 0.8  PROT 7.0 6.4* 5.7*  ALBUMIN 4.3 4.0 3.5    Recent Labs Lab 03/11/16 0320 03/13/16 1044  LIPASE 26 26   No results for input(s): AMMONIA in the last 168 hours. Coagulation Profile: No results for input(s): INR, PROTIME in the last 168 hours. Cardiac Enzymes:  Recent Labs Lab 03/10/16 2302 03/11/16 0225  TROPONINI <0.03 <0.03   BNP (last 3 results) No results for input(s): PROBNP in the last 8760 hours. HbA1C: No results for input(s): HGBA1C in the last 72 hours. CBG: No results for input(s): GLUCAP in the last 168 hours. Lipid Profile: No results for input(s): CHOL, HDL, LDLCALC, TRIG, CHOLHDL, LDLDIRECT in the last 72 hours. Thyroid Function Tests:  Recent Labs  03/14/16 0553  TSH 1.358   Anemia Panel: No results for input(s): VITAMINB12, FOLATE, FERRITIN, TIBC, IRON, RETICCTPCT in the last 72 hours. Sepsis Labs:  Recent Labs Lab 03/10/16 2312 03/11/16 0336 03/13/16 2229  LATICACIDVEN 2.59* 1.67 0.83    Recent Results (from the past 240 hour(s))  Urine culture     Status: Abnormal   Collection Time: 03/11/16  3:45 AM  Result Value Ref Range Status   Specimen Description URINE, RANDOM  Final   Special Requests NONE  Final   Culture (A)  Final    <10,000 COLONIES/mL INSIGNIFICANT GROWTH Performed at [facility], [address], [place], [place] [address]    Report Status 03/12/2016 FINAL  Final  Blood culture (routine x 2)     Status: None (Preliminary  result)   Collection Time: 03/13/16  9:50 PM  Result Value Ref Range Status   Specimen Description BLOOD LEFT WRIST  Final   Special Requests BOTTLES DRAWN AEROBIC AND  ANAEROBIC 5ML  Final   Culture   Final    NO GROWTH 2 DAYS Performed at [facility], [place] [address] [place], [place] [address]    Report Status PENDING  Incomplete  Blood culture (routine x 2)     Status: None (Preliminary result)   Collection Time: 03/13/16 10:15 PM  Result Value Ref Range Status   Specimen Description BLOOD RIGHT FOREARM  Final   Special Requests BOTTLES DRAWN AEROBIC AND ANAEROBIC 5ML  Final   Culture   Final    NO GROWTH 2 DAYS Performed at [facility], [place] [address], [place], [place] [address]    Report Status PENDING  Incomplete         Radiology Studies: Ir Epidurography  Result Date: 03/15/2016 CLINICAL DATA:  Spondylosis without myelopathy. Severe back and leg pain left worse than right. EXAM: LUMBAR EPIDURAL INJECTION DIAGNOSTIC EPIDURAL INJECTION THERAPEUTIC EPIDURAL INJECTION COMPARISON:  MRI 03/13/2016 K PROCEDURE: The procedure, risks, benefits, and alternatives were explained to the patient. Questions regarding the procedure were encouraged and answered. The patient understands and consents to the procedure. LUMBAR EPIDURAL INJECTION An interlaminar approach was performed on the left at L3-4. The overlying skin was cleansed with Betadine, draped in sterile fashion, and anesthetized using 1% Lidocaine. A 20 gauge spinal needle was advanced using loss-of-resistance technique. DIAGNOSTIC EPIDURAL INJECTION Injection of Isovue 200 shows a good epidural pattern with spread above and below the level of needle placement, primarily on the left. No vascular opacification is seen. Pattern of spread seems normal. THERAPEUTIC EPIDURAL INJECTION One hundred twentymg of Depo-Medrol mixed with 3 cc 1% lidocaine were instilled. The procedure was well-tolerated, and the patient returned to the  floor in good condition FLUOROSCOPY TIME:  0 minutes 24 seconds. 50.48 micro gray meter squared IMPRESSION: Technically successful initial epidural injection on the left at L3-4. Electronically Signed   By: Nelson Chimes M.D.   On: 03/15/2016 16:24        Scheduled Meds: . [START ON 03/17/2016] amLODipine  10 mg Oral Daily  . amLODipine  5 mg Oral Once  . citalopram  20 mg Oral Daily  . feeding supplement (ENSURE ENLIVE)  237 mL Oral BID BM  . gabapentin  100 mg Oral QHS  . hydrochlorothiazide  25 mg Oral Daily  . losartan  100 mg Oral Daily  . metoprolol  50 mg Oral Q breakfast  . phosphorus  500 mg Oral BID   Continuous Infusions:    LOS: 2 days    Time spent: 35 minutes.     Elmarie Shiley, MD [facility] Pager [phone]  If 7PM-7AM, please contact night-coverage [email or web address] Password [facility] 03/16/2016, 9:47 AM

## 2016-03-16 NOTE — Progress Notes (Signed)
CRITICAL VALUE ALERT  Critical value received:  Troponin 0.05  Date of notification:  03/16/16  Time of notification:  1106  Critical value read back:  Yes  Nurse who received alert:  Katori Wirsing  MD notified (1st page):  [place]al[place]  Time of first page:  1108  MD notified (2nd page):  Time of second page:  Responding MD:    Time MD responded:

## 2016-03-17 ENCOUNTER — Inpatient Hospital Stay (HOSPITAL_COMMUNITY): Payer: BLUE CROSS/BLUE SHIELD

## 2016-03-17 DIAGNOSIS — I10 Essential (primary) hypertension: Secondary | ICD-10-CM

## 2016-03-17 LAB — BASIC METABOLIC PANEL
Anion gap: 11 (ref 5–15)
BUN: 15 mg/dL (ref 6–20)
CO2: 26 mmol/L (ref 22–32)
Calcium: 9 mg/dL (ref 8.9–10.3)
Chloride: 98 mmol/L — ABNORMAL LOW (ref 101–111)
Creatinine, Ser: 0.71 mg/dL (ref 0.44–1.00)
GFR calc Af Amer: >60 mL/min (ref >60)
GFR calc non Af Amer: >60 mL/min (ref >60)
Glucose, Bld: 120 mg/dL — ABNORMAL HIGH (ref 65–99)
Potassium: 3.5 mmol/L (ref 3.5–5.1)
Sodium: 135 mmol/L (ref 135–145)

## 2016-03-17 LAB — ECHOCARDIOGRAM COMPLETE
Height: 63 in
Weight: 3231.06 oz

## 2016-03-17 MED ORDER — ONDANSETRON HCL 4 MG/2ML IJ SOLN
4.0000 mg | Freq: Three times a day (TID) | INTRAMUSCULAR | Status: DC
Start: 2016-03-17 — End: 2016-03-22
  Administered 2016-03-17 – 2016-03-21 (×15): 4 mg via INTRAVENOUS
  Filled 2016-03-17 (×15): qty 2

## 2016-03-17 NOTE — Progress Notes (Signed)
PROGRESS NOTE    Sheryl Bates  KYH:[id number] DOB: 06-07-1955 DOA: 03/13/2016 PCP: Henrine Screws, MD    Brief Narrative: Sheryl Bates is a 61 y.o. female with medical history significant of HTN endometriosis HLD who presents with nausea, vomiting since 'Sunday ( 5 days prior to admission. She is also complaining of flank pain , left side.   Assessment & Plan:   Principal Problem:   Nausea & vomiting Active Problems:   Hypertension   Hyperlipidemia   Hypokalemia   Dehydration   Back pain  1-Flank Pain; UA with trace of hb. CT renal with left renal stone, non obstructing. Urology consulted. Stone does not explain patient pain.  Had CT angio abdomen and pelvis on 1-28 without significant finding. MRI results as below.  ESR and CRP normal. Blood culture no growth 24 hours.  Appreciate Dr Elsner help.  Underwent steroids injection 2-02. Pain has improved.   Nausea, vomiting;  Unclear etiology. Had CT angio abdomen and pelvis on 1-28 without significant finding. Persist, KUB negative for obstruction.  LFT normal. Lipase normal.  GI consulted.   2-HTN, severe;  IV PRN hydralazine.  Continue with  metoprolol and Cozaar, norvasc. Hydralazine.  HCTZ.  MRI negative for Stroke.   3-Generalized weakness, Denies chest pain. Check EKG, troponin, MRI brain.  TSH normal/  Also felt worse after she received phenergan and IV morphine this am. Will stop phenergan.  Troponin mildly elevated, discussed with cardiology non specific pattern.  FU echo.  Hypophosphatemia; replaced orally.  Repeat labs in am.    DVT prophylaxis: SCD Code Status: Full code.  Family Communication: care discussed with husband who was at bedside 2-02 Disposition Plan: unable to discharge patient, not tolerating diet, HTN.   Consultants:   Urology   Dr Elsner.   Procedures: None   Antimicrobials:   None    Subjective: Not eating, nausea persist.  Denies abdominal pain.     Objective: Vitals:   03/16/16 2342 03/17/16 0540 03/17/16 0948 03/17/16 1318  BP: 139/67 (!) 159/77 (!) 186/86 (!) 163/69  Pulse:  70 67   Resp:      Temp:  98.1 F (36.7 C)  99.1 F (37.3 C)  TempSrc:  Oral  Oral  SpO2:  96%  97%  Weight:      Height:        Intake/Output Summary (Last 24 hours) at 03/17/16 1425 Last data filed at 03/16/16 1700  Gross per 24 hour  Intake              200 ml  Output                0 ml  Net              20' 0 ml   Filed Weights   03/14/16 0604  Weight: 91.6 kg (201 lb 15.1 oz)    Examination:  General exam: keep eyes close.  Respiratory system: Clear to auscultation. Respiratory effort normal. Cardiovascular system: S1 & S2 heard, RRR. No JVD, murmurs, rubs, gallops or clicks. No pedal edema. Gastrointestinal system: Abdomen is nondistended, soft and nontender. No organomegaly or masses felt. Normal bowel sounds heard. Central nervous system: Alert and oriented. No focal neurological deficits. Extremities: Symmetric 5 x 5 power. Skin: No rashes, lesions or ulcers     Data Reviewed: I have personally reviewed following labs and imaging studies  CBC:  Recent Labs Lab 03/10/16 2302 03/13/16 1044 03/13/16 1047 03/14/16 0553 03/15/16 1517  03/16/16 0725  WBC 7.2 8.9  --  7.6 7.4 7.2  NEUTROABS 5.1 7.2  --   --   --   --   HGB 14.5 14.3 15.0 12.6 14.0 13.2  HCT 42.3 41.5 44.0 37.3 40.6 40.5  MCV 90.8 89.1  --  89.9 88.1 90.2  PLT 216 174  --  157 163 342   Basic Metabolic Panel:  Recent Labs Lab 03/13/16 1044 03/13/16 1047 03/13/16 2215 03/14/16 0553 03/15/16 0819 03/16/16 0725 03/17/16 1042  NA 135 137  --  135 138 136 135  K 3.2* 3.3*  --  4.2 3.3* 3.8 3.5  CL 99* 97*  --  103 104 103 98*  CO2 27  --   --  '24 27 24 26  ' GLUCOSE 113* 113*  --  92 118* 108* 120*  BUN 12 11  --  '8 8 8 15  ' CREATININE 0.77 0.80  --  0.65 0.65 0.62 0.71  CALCIUM 8.5*  --   --  8.0* 8.4* 8.6* 9.0  MG  --   --  2.3 2.0  --   --   --    PHOS  --   --  2.1* 2.1*  --  2.1*  --    GFR: Estimated Creatinine Clearance: 78.4 mL/min (by C-G formula based on SCr of 0.71 mg/dL). Liver Function Tests:  Recent Labs Lab 03/10/16 2302 03/13/16 1044 03/14/16 0553  AST 36 24 20  ALT 43 32 27  ALKPHOS 59 54 48  BILITOT 0.6 1.2 0.8  PROT 7.0 6.4* 5.7*  ALBUMIN 4.3 4.0 3.5    Recent Labs Lab 03/11/16 0320 03/13/16 1044  LIPASE 26 26   No results for input(s): AMMONIA in the last 168 hours. Coagulation Profile: No results for input(s): INR, PROTIME in the last 168 hours. Cardiac Enzymes:  Recent Labs Lab 03/10/16 2302 03/11/16 0225 03/16/16 1006 03/16/16 1646 03/16/16 2143  TROPONINI <0.03 <0.03 0.05* 0.04* 0.03*   BNP (last 3 results) No results for input(s): PROBNP in the last 8760 hours. HbA1C: No results for input(s): HGBA1C in the last 72 hours. CBG: No results for input(s): GLUCAP in the last 168 hours. Lipid Profile: No results for input(s): CHOL, HDL, LDLCALC, TRIG, CHOLHDL, LDLDIRECT in the last 72 hours. Thyroid Function Tests: No results for input(s): TSH, T4TOTAL, FREET4, T3FREE, THYROIDAB in the last 72 hours. Anemia Panel: No results for input(s): VITAMINB12, FOLATE, FERRITIN, TIBC, IRON, RETICCTPCT in the last 72 hours. Sepsis Labs:  Recent Labs Lab 03/10/16 2312 03/11/16 0336 03/13/16 2229  LATICACIDVEN 2.59* 1.67 0.83    Recent Results (from the past 240 hour(s))  Urine culture     Status: Abnormal   Collection Time: 03/11/16  3:45 AM  Result Value Ref Range Status   Specimen Description URINE, RANDOM  Final   Special Requests NONE  Final   Culture (A)  Final    <10,000 COLONIES/mL INSIGNIFICANT GROWTH Performed at [facility], [address], [place], [place] [address]    Report Status 03/12/2016 FINAL  Final  Blood culture (routine x 2)     Status: None (Preliminary result)   Collection Time: 03/13/16  9:50 PM  Result Value Ref Range Status   Specimen Description  BLOOD LEFT WRIST  Final   Special Requests BOTTLES DRAWN AEROBIC AND ANAEROBIC 5ML  Final   Culture   Final    NO GROWTH 4 DAYS Performed at [facility], [place] [address]  [place], [place] [address]    Report Status PENDING  Incomplete  Blood culture (routine x 2)     Status: None (Preliminary result)   Collection Time: 03/13/16 10:15 PM  Result Value Ref Range Status   Specimen Description BLOOD RIGHT FOREARM  Final   Special Requests BOTTLES DRAWN AEROBIC AND ANAEROBIC 5ML  Final   Culture   Final    NO GROWTH 4 DAYS Performed at [facility], [place] [address], [place], [place] [address]    Report Status PENDING  Incomplete         Radiology Studies: Dg Abd 1 View  Result Date: 03/17/2016 CLINICAL DATA:  Nausea, vomiting, central abdominal pain. EXAM: ABDOMEN - 1 VIEW COMPARISON:  None. FINDINGS: The bowel gas pattern is normal.  Left renal calculus is noted. IMPRESSION: Left renal calculus.  No evidence of bowel obstruction or ileus. Electronically Signed   By: Marijo Conception, M.D.   On: 03/17/2016 11:25   Mr Brain Wo Contrast  Result Date: 03/16/2016 CLINICAL DATA:  61 year old female with increasing headaches. Status post lumbar epidural steroid injection yesterday for severe back pain. Initial encounter. EXAM: MRI HEAD WITHOUT CONTRAST TECHNIQUE: Multiplanar, multiecho pulse sequences of the brain and surrounding structures were obtained without intravenous contrast. COMPARISON:  Lumbar epidural injection image [id number]. Lumbar MRI 03/13/2016 FINDINGS: Brain: Cerebral volume is within normal limits for age. No restricted diffusion to suggest acute infarction. No midline shift, mass effect, evidence of mass lesion, ventriculomegaly, extra-axial collection or acute intracranial hemorrhage. Cervicomedullary junction and pituitary are within normal limits. Minimal to mild for age nonspecific scattered mostly subcortical cerebral white matter T2 and FLAIR hyperintensity. No  cortical encephalomalacia. No chronic cerebral blood products. There is a chronic lacunar infarct in the left caudate nucleus. The other deep gray matter nuclei, the brainstem, and cerebellum are normal. Vascular: Major intracranial vascular flow voids are preserved. Skull and upper cervical spine: Negative. Normal bone marrow signal. Sinuses/Orbits: Normal orbits soft tissues. Moderate left maxillary sinus mucosal thickening with some bubbly opacity. Trace paranasal sinus mucosal thickening elsewhere. Other: Grossly normal visualized internal auditory structures. Mastoids are clear. Negative scalp soft tissues. IMPRESSION: 1.  No acute intracranial abnormality. 2. Mild for age chronic small vessel disease. 3. Mild to moderate inflammatory changes in the left maxillary sinus. Electronically Signed   By: Genevie Ann M.D.   On: 03/16/2016 11:08   Ir Epidurography  Result Date: 03/15/2016 CLINICAL DATA:  Spondylosis without myelopathy. Severe back and leg pain left worse than right. EXAM: LUMBAR EPIDURAL INJECTION DIAGNOSTIC EPIDURAL INJECTION THERAPEUTIC EPIDURAL INJECTION COMPARISON:  MRI 03/13/2016 K PROCEDURE: The procedure, risks, benefits, and alternatives were explained to the patient. Questions regarding the procedure were encouraged and answered. The patient understands and consents to the procedure. LUMBAR EPIDURAL INJECTION An interlaminar approach was performed on the left at L3-4. The overlying skin was cleansed with Betadine, draped in sterile fashion, and anesthetized using 1% Lidocaine. A 20 gauge spinal needle was advanced using loss-of-resistance technique. DIAGNOSTIC EPIDURAL INJECTION Injection of Isovue 200 shows a good epidural pattern with spread above and below the level of needle placement, primarily on the left. No vascular opacification is seen. Pattern of spread seems normal. THERAPEUTIC EPIDURAL INJECTION One hundred twentymg of Depo-Medrol mixed with 3 cc 1% lidocaine were instilled. The  procedure was well-tolerated, and the patient returned to the floor in good condition FLUOROSCOPY TIME:  0 minutes 24 seconds. 50.48 micro gray meter squared IMPRESSION: Technically successful initial epidural  injection on the left at L3-4. Electronically Signed   By: Nelson Chimes M.D.   On: 03/15/2016 16:24        Scheduled Meds: . amLODipine  10 mg Oral Daily  . citalopram  20 mg Oral Daily  . feeding supplement (ENSURE ENLIVE)  237 mL Oral BID BM  . gabapentin  100 mg Oral QHS  . hydrALAZINE  25 mg Oral Q8H  . hydrochlorothiazide  25 mg Oral Daily  . losartan  100 mg Oral Daily  . metoprolol  50 mg Oral Q breakfast  . ondansetron (ZOFRAN) IV  4 mg Intravenous Q8H  . pantoprazole  40 mg Oral BID  . phosphorus  500 mg Oral BID  . senna-docusate  1 tablet Oral BID   Continuous Infusions:    LOS: 3 days    Time spent: 35 minutes.     Elmarie Shiley, MD [facility] Pager [phone]  If 7PM-7AM, please contact night-coverage [email or web address] Password [email or web address] 03/17/2016, 2:25 PM

## 2016-03-17 NOTE — Consult Note (Signed)
Referring Provider: Dr. Sunnie Nielsen Primary Care Physician:  [name], MD Primary Gastroenterologist:  Gentry Fitz  Reason for Consultation:  Abdominal pain; Nausea/Vomiting  HPI: Sheryl Bates is a 61 y.o. female with the acute onset of abdominal pain one week ago that is left-sided and also occurring in left flank and back. She is unsure if the back pain started first or the abdominal pain started first. Having recurrent N/V this past week as well. A left renal stone was seen on MRI without hydronephrosis and seen by urology who did not feel the stone was a source of her pain. She is s/p a lumbar spine steroid injection on Friday due to inflammation. Abd/pelvis CT angiogram (03/11/16) unrevealing for any acute changes. KUB today showed the left renal stone without any bowel obstruction or ileus. Continues to have left flank pain and intractable nausea. Colonoscopy by Dr. Arlyce Dice in 2009 showed diverticulosis and a small hyperplastic polyp. Husband at bedside.    Past Medical History:  Diagnosis Date  . Allergy   . CIN I (cervical intraepithelial neoplasia I)   . Endometriosis   . Hyperlipidemia   . Hypertension   . Kidney stone   . Ovarian cyst    right  . Vitamin D deficiency     Past Surgical History:  Procedure Laterality Date  . CERVICAL BIOPSY  W/ LOOP ELECTRODE EXCISION    . COLPOSCOPY    . IR GENERIC HISTORICAL  03/15/2016   IR EPIDUROGRAPHY 03/15/2016 [facility]-INTERV RAD  . KNEE SURGERY     Right  . LAPAROSCOPY  1996  . LEEP  1992  . PELVIC LAPAROSCOPY      Prior to Admission medications   Medication Sig Start Date End Date Taking? Authorizing Provider  aspirin EC 81 MG tablet Take 81 mg by mouth daily.   Yes Historical Provider, MD  Cholecalciferol (VITAMIN D PO) Take 2 capsules by mouth daily with breakfast.   Yes Historical Provider, MD  citalopram (CELEXA) 20 MG tablet Take 1 tablet (20 mg total) by mouth daily. 11/24/13  Yes [name], MD   hydrochlorothiazide (HYDRODIURIL) 25 MG tablet TAKE ONE TABLET BY MOUTH ONCE DAILY 08/10/14  Yes [name], MD  HYDROcodone-acetaminophen (NORCO/VICODIN) 5-325 MG tablet Take 1-2 tablets by mouth every 6 (six) hours as needed. Patient taking differently: Take 1-2 tablets by mouth every 6 (six) hours as needed for moderate pain.  03/11/16  Yes [name], DO  losartan (COZAAR) 100 MG tablet Take 1 tablet (100 mg total) by mouth daily. TAKE ONE TABLET BY MOUTH ONE TIME DAILY Patient taking differently: Take 100 mg by mouth daily.  07/19/13  Yes [name], MD  metoprolol tartrate (LOPRESSOR) 50 MG tablet Take 1 tablet (50 mg total) by mouth 2 (two) times daily. Patient taking differently: Take 50 mg by mouth daily with breakfast.  07/19/13  Yes [name], MD  ondansetron (ZOFRAN ODT) 4 MG disintegrating tablet Take 1 tablet (4 mg total) by mouth every 8 (eight) hours as needed for nausea or vomiting. 03/11/16  Yes [name], DO    Scheduled Meds: . amLODipine  10 mg Oral Daily  . citalopram  20 mg Oral Daily  . feeding supplement (ENSURE ENLIVE)  237 mL Oral BID BM  . gabapentin  100 mg Oral QHS  . hydrALAZINE  25 mg Oral Q8H  . hydrochlorothiazide  25 mg Oral Daily  . losartan  100 mg Oral Daily  . metoprolol  50  mg Oral Q breakfast  . ondansetron (ZOFRAN) IV  4 mg Intravenous Q8H  . pantoprazole  40 mg Oral BID  . phosphorus  500 mg Oral BID  . senna-docusate  1 tablet Oral BID   Continuous Infusions: PRN Meds:.acetaminophen **OR** acetaminophen, hydrALAZINE, HYDROcodone-acetaminophen, morphine injection, ondansetron **OR** ondansetron (ZOFRAN) IV, oxyCODONE  Allergies as of 03/13/2016 - Review Complete 03/13/2016  Allergen Reaction Noted  . Sulfonamide derivatives Diarrhea and Nausea Only   . Lisinopril Cough 07/06/2009    Family History  Problem Relation Age of Onset  . Hypertension Mother   . Heart disease Mother   . Diabetes Mother   . Heart disease Father  70    CHF    Social History   Social History  . Marital status: Married    Spouse name: N/A  . Number of children: N/A  . Years of education: N/A   Occupational History  . Not on file.   Social History Main Topics  . Smoking status: Former Smoker    Quit date: 05/03/2005  . Smokeless tobacco: Never Used  . Alcohol use No     Comment: rare  . Drug use: No  . Sexual activity: No   Other Topics Concern  . Not on file   Social History Narrative   She is now raising a granddaughter (planning to adopt as of 07/19/2013)    Review of Systems: All negative except as stated above in HPI.  Physical Exam: Vital signs: Vitals:   03/17/16 0948 03/17/16 1318  BP: (!) 186/86 (!) 163/69  Pulse: 67   Resp:    Temp:  99.1 F (37.3 C)   Last BM Date: 03/10/16 General:   Lethargic, Well-developed, well-nourished, pleasant and cooperative in NAD HEENT: anicteric sclera, oropharynx clear Neck: supple, nontender Lungs:  Clear throughout to auscultation.   No wheezes, crackles, or rhonchi. No acute distress. Heart:  Regular rate and rhythm; no murmurs, clicks, rubs,  or gallops. Abdomen: diffuse tenderness (L > R) with guarding, soft, nondistended, +BS  Rectal:  Deferred Ext: no edema  GI:  Lab Results:  Recent Labs  03/15/16 0819 03/16/16 0725  WBC 7.4 7.2  HGB 14.0 13.2  HCT 40.6 40.5  PLT 163 177   BMET  Recent Labs  03/15/16 0819 03/16/16 0725 03/17/16 1042  NA 138 136 135  K 3.3* 3.8 3.5  CL 104 103 98*  CO2 27 24 26   GLUCOSE 118* 108* 120*  BUN 8 8 15   CREATININE 0.65 0.62 0.71  CALCIUM 8.4* 8.6* 9.0   LFT No results for input(s): PROT, ALBUMIN, AST, ALT, ALKPHOS, BILITOT, BILIDIR, IBILI in the last 72 hours. PT/INR No results for input(s): LABPROT, INR in the last 72 hours.   Studies/Results: Dg Abd 1 View  Result Date: 03/17/2016 CLINICAL DATA:  Nausea, vomiting, central abdominal pain. EXAM: ABDOMEN - 1 VIEW COMPARISON:  None. FINDINGS: The bowel  gas pattern is normal.  Left renal calculus is noted. IMPRESSION: Left renal calculus.  No evidence of bowel obstruction or ileus. Electronically Signed   By: Lupita Raider, M.D.   On: 03/17/2016 11:25   Mr Brain Wo Contrast  Result Date: 03/16/2016 CLINICAL DATA:  61 year old female with increasing headaches. Status post lumbar epidural steroid injection yesterday for severe back pain. Initial encounter. EXAM: MRI HEAD WITHOUT CONTRAST TECHNIQUE: Multiplanar, multiecho pulse sequences of the brain and surrounding structures were obtained without intravenous contrast. COMPARISON:  Lumbar epidural injection image [id number]. Lumbar MRI 03/13/2016 FINDINGS:  Brain: Cerebral volume is within normal limits for age. No restricted diffusion to suggest acute infarction. No midline shift, mass effect, evidence of mass lesion, ventriculomegaly, extra-axial collection or acute intracranial hemorrhage. Cervicomedullary junction and pituitary are within normal limits. Minimal to mild for age nonspecific scattered mostly subcortical cerebral white matter T2 and FLAIR hyperintensity. No cortical encephalomalacia. No chronic cerebral blood products. There is a chronic lacunar infarct in the left caudate nucleus. The other deep gray matter nuclei, the brainstem, and cerebellum are normal. Vascular: Major intracranial vascular flow voids are preserved. Skull and upper cervical spine: Negative. Normal bone marrow signal. Sinuses/Orbits: Normal orbits soft tissues. Moderate left maxillary sinus mucosal thickening with some bubbly opacity. Trace paranasal sinus mucosal thickening elsewhere. Other: Grossly normal visualized internal auditory structures. Mastoids are clear. Negative scalp soft tissues. IMPRESSION: 1.  No acute intracranial abnormality. 2. Mild for age chronic small vessel disease. 3. Mild to moderate inflammatory changes in the left maxillary sinus. Electronically Signed   By: Odessa FlemingH  Hall M.D.   On: 03/16/2016 11:08   Ir  Epidurography  Result Date: 03/15/2016 CLINICAL DATA:  Spondylosis without myelopathy. Severe back and leg pain left worse than right. EXAM: LUMBAR EPIDURAL INJECTION DIAGNOSTIC EPIDURAL INJECTION THERAPEUTIC EPIDURAL INJECTION COMPARISON:  MRI 03/13/2016 K PROCEDURE: The procedure, risks, benefits, and alternatives were explained to the patient. Questions regarding the procedure were encouraged and answered. The patient understands and consents to the procedure. LUMBAR EPIDURAL INJECTION An interlaminar approach was performed on the left at L3-4. The overlying skin was cleansed with Betadine, draped in sterile fashion, and anesthetized using 1% Lidocaine. A 20 gauge spinal needle was advanced using loss-of-resistance technique. DIAGNOSTIC EPIDURAL INJECTION Injection of Isovue 200 shows a good epidural pattern with spread above and below the level of needle placement, primarily on the left. No vascular opacification is seen. Pattern of spread seems normal. THERAPEUTIC EPIDURAL INJECTION One hundred twentymg of Depo-Medrol mixed with 3 cc 1% lidocaine were instilled. The procedure was well-tolerated, and the patient returned to the floor in good condition FLUOROSCOPY TIME:  0 minutes 24 seconds. 50.48 micro gray meter squared IMPRESSION: Technically successful initial epidural injection on the left at L3-4. Electronically Signed   By: Paulina FusiMark  Shogry M.D.   On: 03/15/2016 16:24    Impression/Plan: Intractable N/V and left-sided abdominal pain and left flank pain - Imaging studies with nonobstructing left kidney stone without hydronephrosis (urology ruled out any association of this stone and her pain). Pancreas normal on CT angiogram. Question a gastric outlet obstruction vs peptic ulcer disease. EGD tomorrow by Dr. Hayes/Buccini. Risks/benefits of EGD discussed and patient agrees to proceed. Clear liquid diet at 8pm. NPO p MN.    LOS: 3 days   Dontrey Snellgrove C.  03/17/2016, 2:23 PM  Pager [phone]  If  no answer or after 5 PM call [phone]

## 2016-03-17 NOTE — Evaluation (Signed)
Physical Therapy Evaluation Patient Details Name: Sheryl Bates MRN: [id number] DOB: 06/19/1955 Today's Date: 03/17/2016   History of Present Illness  61 y.o.female who presents with nausea, vomiting, Lt flank pain. PMH: HTN.  Clinical Impression  Pt with mild instability with ambulation during PT session but no gross loss of balance. Pt able to ambulate 30 ft without an assistive device with reports of nausea as limitation for activity. Pt requesting to return to bed. Anticipate pt will progress well with mobility and nausea subsides. PT to follow to progress mobility and activity tolerance for safe transition to home at D/c.    Follow Up Recommendations No PT follow up;Supervision for mobility/OOB    Equipment Recommendations  None recommended by PT    Recommendations for Other Services       Precautions / Restrictions Precautions Precautions: None Restrictions Weight Bearing Restrictions: No      Mobility  Bed Mobility Overal bed mobility: Modified Independent             General bed mobility comments: HOB elevated, using rail to assist  Transfers Overall transfer level: Needs assistance Equipment used: None Transfers: Sit to/from Stand Sit to Stand: Min guard         General transfer comment: min guard for safety  Ambulation/Gait Ambulation/Gait assistance: Min guard Ambulation Distance (Feet): 30 Feet Assistive device: None Gait Pattern/deviations: Step-through pattern Gait velocity: decreased   General Gait Details: Pt with mild instability, but no gross loss of balance. Pt reports being unable to walk any further due to nausea.   Stairs            Wheelchair Mobility    Modified Rankin (Stroke Patients Only)       Balance Overall balance assessment: Needs assistance Sitting-balance support: No upper extremity supported Sitting balance-Leahy Scale: Good     Standing balance support: No upper extremity supported Standing  balance-Leahy Scale: Fair                               Pertinent Vitals/Pain Pain Assessment: No/denies pain    Home Living Family/patient expects to be discharged to:: Private residence Living Arrangements: Spouse/significant other Available Help at Discharge: Family;Available 24 hours/day Type of Home: House Home Access: Level entry     Home Layout: One level Home Equipment: Cane - single point      Prior Function Level of Independence: Independent               Hand Dominance        Extremity/Trunk Assessment   Upper Extremity Assessment Upper Extremity Assessment: Overall WFL for tasks assessed    Lower Extremity Assessment Lower Extremity Assessment: Overall WFL for tasks assessed    Cervical / Trunk Assessment Cervical / Trunk Assessment: Normal  Communication   Communication: No difficulties  Cognition Arousal/Alertness: Awake/alert Behavior During Therapy: WFL for tasks assessed/performed Overall Cognitive Status: Within Functional Limits for tasks assessed                      General Comments      Exercises     Assessment/Plan    PT Assessment Patient needs continued PT services  PT Problem List Decreased strength;Decreased activity tolerance;Decreased balance;Decreased mobility          PT Treatment Interventions Gait training;Stair training;Functional mobility training;Therapeutic activities;Therapeutic exercise;Balance training;Patient/family education    PT Goals (Current goals can be found in the  Care Plan section)  Acute Rehab PT Goals Patient Stated Goal: get rid of nausea PT Goal Formulation: With patient/family Time For Goal Achievement: 03/31/16 Potential to Achieve Goals: Good    Frequency Min 3X/week   Barriers to discharge        Co-evaluation               End of Session   Activity Tolerance:  (limited by patient reports of nausea) Patient left: in bed;with call bell/phone within  reach;with family/visitor present Nurse Communication: Mobility status         Time: 1610-9604 PT Time Calculation (min) (ACUTE ONLY): 11 min   Charges:   PT Evaluation $PT Eval Moderate Complexity: 1 Procedure     PT G Codes:        Christiane Ha, PT, CSCS Pager [phone] Office [phone]  03/17/2016, 1:12 PM

## 2016-03-17 NOTE — Progress Notes (Signed)
*  PRELIMINARY RESULTS* Echocardiogram 2D Echocardiogram has been performed.  Jeryl Columbialliott, Shelanda Duvall 03/17/2016, 12:45 PM

## 2016-03-18 ENCOUNTER — Encounter (HOSPITAL_COMMUNITY)
Admission: EM | Disposition: A | Discharge status: Home / Self Care | Payer: Self-pay | Source: Home / Self Care | Attending: Internal Medicine

## 2016-03-18 ENCOUNTER — Inpatient Hospital Stay (HOSPITAL_COMMUNITY): Payer: BLUE CROSS/BLUE SHIELD | Admitting: Anesthesiology

## 2016-03-18 ENCOUNTER — Encounter (HOSPITAL_COMMUNITY): Payer: Self-pay

## 2016-03-18 HISTORY — PX: ESOPHAGOGASTRODUODENOSCOPY: SHX5428

## 2016-03-18 LAB — CULTURE, BLOOD (ROUTINE X 2)
Culture: NO GROWTH
Culture: NO GROWTH

## 2016-03-18 LAB — BASIC METABOLIC PANEL
Anion gap: 12 (ref 5–15)
BUN: 16 mg/dL (ref 6–20)
CO2: 27 mmol/L (ref 22–32)
Calcium: 9.3 mg/dL (ref 8.9–10.3)
Chloride: 96 mmol/L — ABNORMAL LOW (ref 101–111)
Creatinine, Ser: 0.84 mg/dL (ref 0.44–1.00)
GFR calc Af Amer: >60 mL/min (ref >60)
GFR calc non Af Amer: >60 mL/min (ref >60)
Glucose, Bld: 104 mg/dL — ABNORMAL HIGH (ref 65–99)
Potassium: 3.3 mmol/L — ABNORMAL LOW (ref 3.5–5.1)
Sodium: 135 mmol/L (ref 135–145)

## 2016-03-18 LAB — HEPATIC FUNCTION PANEL
ALT: 48 U/L (ref 14–54)
AST: 29 U/L (ref 15–41)
Albumin: 4.4 g/dL (ref 3.5–5.0)
Alkaline Phosphatase: 51 U/L (ref 38–126)
Bilirubin, Direct: 0.3 mg/dL (ref 0.1–0.5)
Indirect Bilirubin: 0.9 mg/dL (ref 0.3–0.9)
Total Bilirubin: 1.2 mg/dL (ref 0.3–1.2)
Total Protein: 7 g/dL (ref 6.5–8.1)

## 2016-03-18 LAB — CBC
HCT: 45.9 % (ref 36.0–46.0)
Hemoglobin: 15.5 g/dL — ABNORMAL HIGH (ref 12.0–15.0)
MCH: 29.7 pg (ref 26.0–34.0)
MCHC: 33.8 g/dL (ref 30.0–36.0)
MCV: 87.9 fL (ref 78.0–100.0)
Platelets: 232 10*3/uL (ref 150–400)
RBC: 5.22 MIL/uL — ABNORMAL HIGH (ref 3.87–5.11)
RDW: 12.2 % (ref 11.5–15.5)
WBC: 9.7 10*3/uL (ref 4.0–10.5)

## 2016-03-18 LAB — LIPASE, BLOOD: Lipase: 63 U/L — ABNORMAL HIGH (ref 11–51)

## 2016-03-18 LAB — PHOSPHORUS: Phosphorus: 4.8 mg/dL — ABNORMAL HIGH (ref 2.5–4.6)

## 2016-03-18 SURGERY — EGD (ESOPHAGOGASTRODUODENOSCOPY)
Anesthesia: Monitor Anesthesia Care

## 2016-03-18 MED ORDER — PROPOFOL 500 MG/50ML IV EMUL
INTRAVENOUS | Status: DC | PRN
  Administered 2016-03-18: 100 ug/kg/min via INTRAVENOUS

## 2016-03-18 MED ORDER — HYDRALAZINE HCL 20 MG/ML IJ SOLN
INTRAMUSCULAR | Status: AC
  Filled 2016-03-18: qty 1

## 2016-03-18 MED ORDER — SODIUM CHLORIDE 0.9 % IV SOLN
INTRAVENOUS | Status: DC
  Administered 2016-03-18: 13:00:00 via INTRAVENOUS
  Administered 2016-03-18: 500 mL via INTRAVENOUS
  Administered 2016-03-20: 21:00:00 via INTRAVENOUS

## 2016-03-18 MED ORDER — SODIUM CHLORIDE 0.9 % IV SOLN
30.0000 meq | Freq: Once | INTRAVENOUS | Status: AC
  Administered 2016-03-18: 30 meq via INTRAVENOUS
  Filled 2016-03-18: qty 15

## 2016-03-18 MED ORDER — PROPOFOL 10 MG/ML IV BOLUS
INTRAVENOUS | Status: DC | PRN
Start: 2016-03-18 — End: 2016-03-18
  Administered 2016-03-18: 50 mg via INTRAVENOUS
  Administered 2016-03-18 (×2): 20 mg via INTRAVENOUS

## 2016-03-18 MED ORDER — PROPOFOL 10 MG/ML IV BOLUS
INTRAVENOUS | Status: AC
  Filled 2016-03-18: qty 20

## 2016-03-18 NOTE — Progress Notes (Signed)
Dr Donley RedderHodeirien notifed of BP 195/8 ok to give hydralazine 10 mg iv .

## 2016-03-18 NOTE — Progress Notes (Signed)
Physical Therapy Treatment Patient Details Name: Sheryl HoppingRae Michele Bohall MRN: [id number] DOB: 08/18/1955 Today's Date: 03/18/2016    History of Present Illness 61 y.o.female who presents with nausea, vomiting, Lt flank pain. PMH: HTN.    PT Comments    Pt required Min assist for ambulation on today. Increased assistance required due to significant dizziness. High fall risk with OOB activity on today. Pt was only able to safely walk ~20 feet x2. Vitals WNL (see below). Assisted pt back to bed. Pt had a procedure earlier today-unsure if dizziness is a result of this. Will continue to follow and progress activity as tolerated. Made RN aware of pt's performance as well.    Follow Up Recommendations  Supervision/Assistance - 24 hour     Equipment Recommendations  None recommended by PT (at this time-continuing to assess)    Recommendations for Other Services       Precautions / Restrictions Precautions Precautions: Fall Precaution Comments: high fall risk on today Restrictions Weight Bearing Restrictions: No    Mobility  Bed Mobility Overal bed mobility: Modified Independent                Transfers Overall transfer level: Needs assistance   Transfers: Sit to/from Stand Sit to Stand: Min assist         General transfer comment: Assist to stabilize.   Ambulation/Gait Ambulation/Gait assistance: Min assist Ambulation Distance (Feet): 20 Feet (x2) Assistive device: None (IV pole) Gait Pattern/deviations: Step-through pattern;Decreased stride length     General Gait Details: Very unsteady and at high fall risk for today. Pt walked ~20 feet before having to sit down due to dizziness and fall risk. Assessed vitals: BP 113/58, HR 97, O2 98%. Took seated rest break then walked back to room. Pt remained very unsteady.    Stairs            Wheelchair Mobility    Modified Rankin (Stroke Patients Only)       Balance Overall balance assessment: Needs assistance           Standing balance-Leahy Scale: Poor                      Cognition Arousal/Alertness: Awake/alert Behavior During Therapy: WFL for tasks assessed/performed Overall Cognitive Status: Within Functional Limits for tasks assessed                      Exercises      General Comments        Pertinent Vitals/Pain Pain Assessment: No/denies pain    Home Living                      Prior Function            PT Goals (current goals can now be found in the care plan section) Progress towards PT goals: Not progressing toward goals - comment (due to dizziness limiting tx session)    Frequency    Min 3X/week      PT Plan Current plan remains appropriate    Co-evaluation             End of Session Equipment Utilized During Treatment: Gait belt Activity Tolerance:  (Limited by dizziness) Patient left: in bed;with call bell/phone within reach;with family/visitor present     Time: 1419-1440 PT Time Calculation (min) (ACUTE ONLY): 21 min  Charges:  $Gait Training: 8-22 mins  G Codes:      Weston Anna, MPT Pager: [phone]

## 2016-03-18 NOTE — Progress Notes (Signed)
PROGRESS NOTE    Sheryl Bates  WUJ:[id number] DOB: October 28, 1955 DOA: 03/13/2016 PCP: Henrine Screws, MD    Brief Narrative: Sheryl Bates is a 61 y.o. female with medical history significant of HTN endometriosis HLD who presents with nausea, vomiting since 'Sunday ( 5 days prior to admission. She is also complaining of flank pain , left side.   Assessment & Plan:   Principal Problem:   Nausea & vomiting Active Problems:   Hypertension   Hyperlipidemia   Hypokalemia   Dehydration   Back pain  1-Flank Pain; UA with trace of hb. CT renal with left renal stone, non obstructing. Urology consulted. Stone does not explain patient pain.  Had CT angio abdomen and pelvis on 1-28 without significant finding. MRI results as below.  ESR and CRP normal. Blood culture no growth 24 hours.  Appreciate Dr Elsner help. Underwent steroids injection 2-02. Pain has improved.   Nausea, vomiting;  Unclear etiology. Related to pain ?, pain meds ?  Had CT angio abdomen and pelvis on 1-28 without significant finding. KUB negative for obstruction.  LFT normal. Lipase normal.  GI consulted. Endoscopy negative for gastric outlet obstruction.  Try clear, small amount: 30 cc every half hour.   2-HTN, severe;  IV PRN hydralazine.  Continue with  metoprolol and Cozaar, norvasc. Hydralazine.  HCTZ.  MRI negative for Stroke.   3-Generalized weakness, TSH normal/  Troponin mildly elevated, discussed with cardiology non specific pattern. ECHO normal EF, diastolic dysfunction    Hypophosphatemia; replaced orally.  Repeat labs in am.    DVT prophylaxis: SCD Code Status: Full code.  Family Communication: care discussed with husband who was at bedside 2-05 Disposition Plan: unable to discharge patient, not tolerating diet, HTN.   Consultants:   Urology   Dr Elsner.   Procedures: None   Antimicrobials:   None    Subjective: Flank pain is better.  Just came from endoscopy.    Still with nausea. Willing to try sips of clear.     Objective: Vitals:   03/18/16 1130 03/18/16 1150 03/18/16 1432 03/18/16 1501  BP: (!) 177/69 (!) 138/58 (!) 113/58 (!) 150/65  Pulse:   97 74  Resp:      Temp:      TempSrc:      SpO2:   98%   Weight:      Height:        Intake/Output Summary (Last 24 hours) at 03/18/16 1532 Last data filed at 03/18/16 1210  Gross per 24 hour  Intake              400 ml  Output                0 ml  Net              40' 0 ml   Filed Weights   03/14/16 0604 03/18/16 0918  Weight: 91.6 kg (201 lb 15.1 oz) 91.2 kg (201 lb)    Examination:  General exam: Alert today  Respiratory system: Clear to auscultation. Respiratory effort normal. Cardiovascular system: S1 & S2 heard, RRR. No JVD, murmurs, rubs, gallops or clicks. No pedal edema. Gastrointestinal system: Abdomen is nondistended, soft and nontender. No organomegaly or masses felt. Normal bowel sounds heard. Central nervous system: Alert and oriented. No focal neurological deficits. Extremities: Symmetric 5 x 5 power. Skin: No rashes, lesions or ulcers     Data Reviewed: I have personally reviewed following labs and imaging studies  CBC:  Recent Labs Lab 03/13/16 1044 03/13/16 1047 03/14/16 0553 03/15/16 0819 03/16/16 0725 03/18/16 0517  WBC 8.9  --  7.6 7.4 7.2 9.7  NEUTROABS 7.2  --   --   --   --   --   HGB 14.3 15.0 12.6 14.0 13.2 15.5*  HCT 41.5 44.0 37.3 40.6 40.5 45.9  MCV 89.1  --  89.9 88.1 90.2 87.9  PLT 174  --  157 163 177 438   Basic Metabolic Panel:  Recent Labs Lab 03/13/16 2215 03/14/16 0553 03/15/16 0819 03/16/16 0725 03/17/16 1042 03/18/16 0517  NA  --  135 138 136 135 135  K  --  4.2 3.3* 3.8 3.5 3.3*  CL  --  103 104 103 98* 96*  CO2  --  '24 27 24 26 27  ' GLUCOSE  --  92 118* 108* 120* 104*  BUN  --  '8 8 8 15 16  ' CREATININE  --  0.65 0.65 0.62 0.71 0.84  CALCIUM  --  8.0* 8.4* 8.6* 9.0 9.3  MG 2.3 2.0  --   --   --   --   PHOS 2.1*  2.1*  --  2.1*  --  4.8*   GFR: Estimated Creatinine Clearance: 74.4 mL/min (by C-G formula based on SCr of 0.84 mg/dL). Liver Function Tests:  Recent Labs Lab 03/13/16 1044 03/14/16 0553  AST 24 20  ALT 32 27  ALKPHOS 54 48  BILITOT 1.2 0.8  PROT 6.4* 5.7*  ALBUMIN 4.0 3.5    Recent Labs Lab 03/13/16 1044  LIPASE 26   No results for input(s): AMMONIA in the last 168 hours. Coagulation Profile: No results for input(s): INR, PROTIME in the last 168 hours. Cardiac Enzymes:  Recent Labs Lab 03/16/16 1006 03/16/16 1646 03/16/16 2143  TROPONINI 0.05* 0.04* 0.03*   BNP (last 3 results) No results for input(s): PROBNP in the last 8760 hours. HbA1C: No results for input(s): HGBA1C in the last 72 hours. CBG: No results for input(s): GLUCAP in the last 168 hours. Lipid Profile: No results for input(s): CHOL, HDL, LDLCALC, TRIG, CHOLHDL, LDLDIRECT in the last 72 hours. Thyroid Function Tests: No results for input(s): TSH, T4TOTAL, FREET4, T3FREE, THYROIDAB in the last 72 hours. Anemia Panel: No results for input(s): VITAMINB12, FOLATE, FERRITIN, TIBC, IRON, RETICCTPCT in the last 72 hours. Sepsis Labs:  Recent Labs Lab 03/13/16 2229  LATICACIDVEN 0.83    Recent Results (from the past 240 hour(s))  Urine culture     Status: Abnormal   Collection Time: 03/11/16  3:45 AM  Result Value Ref Range Status   Specimen Description URINE, RANDOM  Final   Special Requests NONE  Final   Culture (A)  Final    <10,000 COLONIES/mL INSIGNIFICANT GROWTH Performed at [facility], [place] [address], [place], [place] [address]    Report Status 03/12/2016 FINAL  Final  Blood culture (routine x 2)     Status: None (Preliminary result)   Collection Time: 03/13/16  9:50 PM  Result Value Ref Range Status   Specimen Description BLOOD LEFT WRIST  Final   Special Requests BOTTLES DRAWN AEROBIC AND ANAEROBIC 5ML  Final   Culture   Final    NO GROWTH 4 DAYS Performed at [facility], [place] [address], [place], [place] [address]    Report Status PENDING  Incomplete  Blood culture (routine x 2)     Status: None (Preliminary result)   Collection Time: 03/13/16  10:15 PM  Result Value Ref Range Status   Specimen Description BLOOD RIGHT FOREARM  Final   Special Requests BOTTLES DRAWN AEROBIC AND ANAEROBIC 5ML  Final   Culture   Final    NO GROWTH 4 DAYS Performed at [facility], [address], [place], [place] [address]    Report Status PENDING  Incomplete         Radiology Studies: Dg Abd 1 View  Result Date: 03/17/2016 CLINICAL DATA:  Nausea, vomiting, central abdominal pain. EXAM: ABDOMEN - 1 VIEW COMPARISON:  None. FINDINGS: The bowel gas pattern is normal.  Left renal calculus is noted. IMPRESSION: Left renal calculus.  No evidence of bowel obstruction or ileus. Electronically Signed   By: Marijo Conception, M.D.   On: 03/17/2016 11:25        Scheduled Meds: . amLODipine  10 mg Oral Daily  . citalopram  20 mg Oral Daily  . feeding supplement (ENSURE ENLIVE)  237 mL Oral BID BM  . gabapentin  100 mg Oral QHS  . hydrALAZINE  25 mg Oral Q8H  . hydrochlorothiazide  25 mg Oral Daily  . losartan  100 mg Oral Daily  . metoprolol  50 mg Oral Q breakfast  . ondansetron (ZOFRAN) IV  4 mg Intravenous Q8H  . pantoprazole  40 mg Oral BID  . potassium chloride (KCL MULTIRUN) 30 mEq in 265 mL IVPB  30 mEq Intravenous Once  . senna-docusate  1 tablet Oral BID   Continuous Infusions: . sodium chloride 20 mL/hr at 03/18/16 1301     LOS: 4 days    Time spent: 35 minutes.     Elmarie Shiley, MD [facility] Pager [phone]  If 7PM-7AM, please contact night-coverage [email or web address] Password [facility] [facility] 03/18/2016, 3:32 PM

## 2016-03-18 NOTE — Progress Notes (Signed)
Chaplain stopped by patient's room while making rounds.  Husband was at bedside and said that she was sleeping.  Husband had just begun eating.  Chaplain shared that she would check back later as time allowed.    Beryl MeagerMartin, Baani Bober G. Chaplain Resident [phone]   03/18/16 1326  Clinical Encounter Type  Visited With Family  Visit Type Initial

## 2016-03-18 NOTE — Progress Notes (Signed)
OT cancellation  Pt not available/at procedure. Will check back on pt later this day or next day  Lise AuerLori Emireth Cockerham, [place] [phone]

## 2016-03-18 NOTE — Anesthesia Preprocedure Evaluation (Signed)
Anesthesia Evaluation  Patient identified by MRN, date of birth, ID band Patient awake    Reviewed: Allergy & Precautions, H&P , NPO status , Patient's Chart, lab work & pertinent test results  Airway Mallampati: II   Neck ROM: full    Dental   Pulmonary former smoker,    breath sounds clear to auscultation       Cardiovascular hypertension,  Rhythm:regular Rate:Normal     Neuro/Psych    GI/Hepatic   Endo/Other    Renal/GU stones     Musculoskeletal   Abdominal   Peds  Hematology   Anesthesia Other Findings   Reproductive/Obstetrics                             Anesthesia Physical Anesthesia Plan  ASA: II  Anesthesia Plan: MAC   Post-op Pain Management:    Induction: Intravenous  Airway Management Planned: Nasal Cannula  Additional Equipment:   Intra-op Plan:   Post-operative Plan:   Informed Consent: I have reviewed the patients History and Physical, chart, labs and discussed the procedure including the risks, benefits and alternatives for the proposed anesthesia with the patient or authorized representative who has indicated his/her understanding and acceptance.     Plan Discussed with: CRNA, Anesthesiologist and Surgeon  Anesthesia Plan Comments:         Anesthesia Quick Evaluation

## 2016-03-18 NOTE — Progress Notes (Signed)
PT Cancellation Note  Patient Details Name: Sheryl Bates MRN: [id number] DOB: 02/09/1956   Cancelled Treatment:    Reason Eval/Treat Not Completed: Patient at procedure or test/unavailable. Will check back later today if schedule allows and if pt feels up to working with PT.    Rebeca AlertJannie Chantalle Defilippo, MPT Pager: [phone]

## 2016-03-18 NOTE — Progress Notes (Signed)
EGD well-tolerated, but unrevealing for source of sx (bx's pending).  Suspect pt's n/v is NOT a primary gastric disturbance, but rather, secondary either to her lumbar back pain (via pain-induced vagal stimulation) or secondary to her pain medications (sx were somewhat better yesterday--had nausea, but no vomiting, and actually kept down 2 servings of sherbert).  PLAN:  1. trial of modified gastroparesis clear liq diet--6730mL aliquots of clr liq q30' while awake (ordered, and explained to pt).  2. Encouraged pt to minimize pn meds b/c they could be contributing to her sx  Sheryl Bates, M.D. Pager [phone] If no answer or after 5 PM call [phone]

## 2016-03-18 NOTE — Transfer of Care (Signed)
Immediate Anesthesia Transfer of Care Note  Patient: Sheryl Bates  Procedure(s) Performed: Procedure(s): ESOPHAGOGASTRODUODENOSCOPY (EGD) (N/A)  Patient Location: PACU  Anesthesia Type:MAC  Level of Consciousness:  sedated, patient cooperative and responds to stimulation  Airway & Oxygen Therapy:Patient Spontanous Breathing and Patient connected to face mask oxgen  Post-op Assessment:  Report given to PACU RN and Post -op Vital signs reviewed and stable  Post vital signs:  Reviewed and stable  Last Vitals:  Vitals:   03/18/16 0501 03/18/16 0918  BP: 129/65 (!) 192/73  Pulse: 68 70  Resp: 17 17  Temp: 36.7 C 57.4 C    Complications: No apparent anesthesia complications

## 2016-03-18 NOTE — Op Note (Signed)
[facility] Patient Name: Sheryl Bates Procedure Date: 03/18/2016 MRN: [id number] Attending MD: Bernette Redbird , MD Date of Birth: 07/15/1955 CSN: [id number] Age: 61 Admit Type: Inpatient Procedure:                Upper GI endoscopy Indications:              Nausea with vomiting Providers:                Bernette Redbird, MD, Anthony Sar, RN, Kandice Robinsons, Technician Referring MD:              Medicines:                Monitored Anesthesia Care Complications:            No immediate complications. Estimated Blood Loss:     Estimated blood loss was minimal. Procedure:                Pre-Anesthesia Assessment:                           - Prior to the procedure, a History and Physical                            was performed, and patient medications and                            allergies were reviewed. The patient's tolerance of                            previous anesthesia was also reviewed. The risks                            and benefits of the procedure and the sedation                            options and risks were discussed with the patient.                            All questions were answered, and informed consent                            was obtained. Prior Anticoagulants: The patient has                            taken no previous anticoagulant or antiplatelet                            agents. ASA Grade Assessment: II - A patient with                            mild systemic disease. After reviewing the risks  and benefits, the patient was deemed in                            satisfactory condition to undergo the procedure.                           After obtaining informed consent, the endoscope was                            passed under direct vision. Throughout the                            procedure, the patient's blood pressure, pulse, and                            oxygen saturations were  monitored continuously. The                            [id number] ([id number]) scope was introduced through the                            mouth, and advanced to the second part of duodenum.                            The upper GI endoscopy was accomplished without                            difficulty. The patient tolerated the procedure                            well. Scope In: Scope Out: Findings:      The examined esophagus was normal.      There is no endoscopic evidence of esophagitis or hiatal hernia in the       entire esophagus.      The entire examined stomach was normal. Biopsies were taken with a cold       forceps for histology.      The cardia and gastric fundus were normal on retroflexion.      There is no endoscopic evidence of inflammation, ulceration, bezoar/food       (residue), fluid collections or mass in the entire examined stomach.      The examined duodenum was normal. Biopsies for histology were taken with       a cold forceps for evaluation of celiac disease. Impression:               - Normal esophagus.                           - Normal stomach. Biopsied.                           - Normal examined duodenum. Biopsied.                           - No source of nausea/vomiting endoscopically  evident. Moderate Sedation:      This patient was sedated with monitored anesthesia care, not moderate       sedation. Recommendation:           - Await pathology results.                           - Continue present medications.                           - Gastroparesis diet today. Procedure Code(s):        --- Professional ---                           [phone], Esophagogastroduodenoscopy, flexible,                            transoral; with biopsy, single or multiple Diagnosis Code(s):        --- Professional ---                           R11.2, Nausea with vomiting, unspecified CPT copyright 2016 [employer]. All rights  reserved. The codes documented in this report are preliminary and upon coder review may  be revised to meet current compliance requirements. Bernette Redbird, MD 03/18/2016 11:11:20 AM This report has been signed electronically. Number of Addenda: 0

## 2016-03-19 ENCOUNTER — Encounter (HOSPITAL_COMMUNITY): Payer: Self-pay | Admitting: Gastroenterology

## 2016-03-19 LAB — BASIC METABOLIC PANEL
Anion gap: 8 (ref 5–15)
BUN: 15 mg/dL (ref 6–20)
CO2: 28 mmol/L (ref 22–32)
Calcium: 9.2 mg/dL (ref 8.9–10.3)
Chloride: 100 mmol/L — ABNORMAL LOW (ref 101–111)
Creatinine, Ser: 0.94 mg/dL (ref 0.44–1.00)
GFR calc Af Amer: >60 mL/min (ref >60)
GFR calc non Af Amer: >60 mL/min (ref >60)
Glucose, Bld: 103 mg/dL — ABNORMAL HIGH (ref 65–99)
Potassium: 3.6 mmol/L (ref 3.5–5.1)
Sodium: 136 mmol/L (ref 135–145)

## 2016-03-19 LAB — PHOSPHORUS: Phosphorus: 3.8 mg/dL (ref 2.5–4.6)

## 2016-03-19 NOTE — Progress Notes (Signed)
GASTROENTEROLOGY PROGRESS NOTE  Problem:   Left flank pain. Nausea and vomiting.  Subjective: Left flank pain has resolved. I suspect it may have been due to lumbar radiculopathy.  Nausea is better. No vomiting.   Patient has been tolerating clear liquid aliquots, 30 ML's every 30 minutes, and feels ready to advance. In fact, last night I was called and asked if she could be advanced at that time.   The patient has had some small squirts of liquid stool, no ongoing diarrhea, just 1 episode today.  Objective: No evident distress. Chemistry panel unremarkable.  Assessment: Overall, the patient appears to be somewhat improved.  Plan: We will increase the size of the patient's clear liquid aliquots to 60 mLs every 30 minutes while awake, with the hope of advancing the diet further tomorrow if no significant nausea and no vomiting.  Nicholaos Schippers V. Aylene Acoff, M.Florencia Reasons. 03/19/2016 1:04 PM  Pager [phone] If no answer or after 5 PM call [phone]

## 2016-03-19 NOTE — Progress Notes (Signed)
Nutrition Follow-up  DOCUMENTATION CODES:   Not applicable  INTERVENTION:   Recommend nutrition support via nasoduodenal or nasojejunal tube if possible vs TPN as patient has been without adequate nutrition for >10 days and has lost 14lbs(7%) since admit.   **Patient is at high risk for refeeding syndrome  NUTRITION DIAGNOSIS:   Inadequate oral intake related to inability to eat, vomiting, nausea as evidenced by NPO status.  GOAL:   Patient will meet greater than or equal to 90% of their needs  MONITOR:   Diet advancement, Weight trends, Labs  ASSESSMENT:    61 y.o. female with medical history significant of HTN endometriosis HLD medically for intractable nausea and vomiting back pain with questionable discitis vs musculoskeletal vs neuropathic. Found to have left kidney stone    Met with pt in room today. Pt continues to have nausea and vomiting and is unable to keep any food down. Pt reports only being able to drink ginger ale. The source of pt's nausea and vomting is unknown. Pt has had multiple tests including CT, KUB, EGD with no significant findings. Pt does have elevated Lipase. Pt has lost 14lbs(7%) since admit which is significant. RN weighed pt in room today at 187lbs. Pt has now been >10 days without adequate nutrition. Pt is unable to keep down any supplements and reports that the smell of them makes her sick. Patient currently on gastroparesis diet trial. So far, unable to keep anything down. RD spoke to Dr. Tyrell Antonio today about possible nutrition support for this pt. Ideally, we would want to use the gut first via a nasoduodenal or nasojejunal tube if possible and see if pt can tolerate feedings but if this is not possible, RD would recommend TPN. This pt is at high risk for refeeding syndrome.   Medications reviewed and include: celexa, hydrochlorothiazide, zofran, protonix, senokot  Labs reviewed: K 3.6 wnl, P 3.8 wnl, Cl 100(L), Lipase 63(H)   Diet Order:    Gastroparesis diet- currently clear liquids  Skin:  Reviewed, no issues  Last BM:  2/5  Height:   Ht Readings from Last 1 Encounters:  03/18/16 '5\' 3"'  (1.6 m)    Weight:   Wt Readings from Last 1 Encounters:  03/19/16 187 lb (84.8 kg)    Ideal Body Weight:  52.2 kg  BMI:  Body mass index is 33.13 kg/m.  Estimated Nutritional Needs:   Kcal:  1600-1800kcal/day   Protein:  91-102g/day   Fluid:  >1.6L/day   EDUCATION NEEDS:   No education needs identified at this time  Koleen Distance, RD, LDN Pager #[phone]

## 2016-03-19 NOTE — Progress Notes (Signed)
PROGRESS NOTE    Sheryl Bates  EEA:[id number] DOB: [birth year] DOA: 03/13/2016 PCP: Henrine Screws, MD    Brief Narrative: Sheryl Bates is a 61 y.o. female with medical history significant of HTN endometriosis HLD who presents with nausea, vomiting since Sunday ( 5 days prior to admission. She is also complaining of flank pain , left side.   Patient had CT renal protocol which only showed non obstructive renal stone. Evaluated by nephrology who doesn't think pain and vomiting is related to stone. MRI Enhancing central and deformity within the L2 superior endplate, L3 inferior endplate, at L4 superior endplate with edema and enhancement. At L2-3 there is central disc enhancement extending into the L2 inferior endplate central deformity and enhanced throughout the adjacent L2 and L3 endplates.  Enhancement within the L2-3 intervertebral disc could represent infection but there is no significant disc edema or surrounding soft tissue edema and infection is considered less likely.  Mild lumbar spondylosis with multiple levels of mild foraminal narrowing. Patient was evaluated by Dr   Ellene Route who recommend steroid injection spine. Patient underwent spinal steroid injection which improved back pain.  Patient continue to have nausea and vomiting. Not tolerating diet. She had CT angio 1-28 unrevealing. KUB this admission negative for constipation, lipase normal, LFT normal. Evaluated by GI. Underwent endoscopy which was negative. Plan is to try 30 cc clear diet every half an hour.   Assessment & Plan:   Principal Problem:   Nausea & vomiting Active Problems:   Hypertension   Hyperlipidemia   Hypokalemia   Dehydration   Back pain  1-Flank Pain; UA with trace of hb. CT renal with left renal stone, non obstructing. Urology consulted. Stone does not explain patient pain.  Had CT angio abdomen and pelvis on 1-28 without significant finding. MRI results as below.  ESR and CRP normal.  Blood culture no growth 24 hours.  Appreciate Dr Ellene Route help. Underwent steroids injection 2-02. Pain has improved.   2-Nausea, vomiting; persist  Unclear etiology. Related to pain ?, pain meds ?  Had CT angio abdomen and pelvis on 1-28 without significant finding. KUB negative for obstruction.  LFT normal. Lipase normal.  GI consulted. Endoscopy negative for gastric outlet obstruction.  Try clear, small amount: 30 cc every half hour.  Discussed with Dr Cristina Gong , would see how she does with small amount of liquids, if no improvement could consider Reglan. He will follow on patient tomorrow.   3-HTN, severe;  IV PRN hydralazine.  Continue with  metoprolol and Cozaar, norvasc. Hydralazine. HCTZ.  MRI negative for Stroke.   4-Generalized weakness, poor oral intake.  TSH normal/   5-Troponin mildly elevated, discussed with cardiology non specific pattern. ECHO normal EF, diastolic dysfunction    6-Hypophosphatemia; replaced orally.     DVT prophylaxis: SCD Code Status: Full code.  Family Communication: care discussed with husband who was at bedside 2-06 Disposition Plan: unable to discharge patient, not tolerating diet, HTN.   Consultants:   Urology   Dr Ellene Route.   Procedures: None   Antimicrobials:   None    Subjective: She did well with 30 cc every half an hour yesterday. Started 60 cc, and just vomited.  Denies abdominal pain. Had watery bm today    Objective: Vitals:   03/18/16 2105 03/19/16 0623 03/19/16 1116 03/19/16 1453  BP: (!) 152/72 138/82  (!) 205/90  Pulse: 79 68  62  Resp: '19 19  17  ' Temp: 98.6 F (37 C) 98.5  F (36.9 C)    TempSrc: Oral Oral    SpO2: 95% 96%  97%  Weight:   84.8 kg (187 lb)   Height:        Intake/Output Summary (Last 24 hours) at 03/19/16 1528 Last data filed at 03/19/16 1200  Gross per 24 hour  Intake           441.34 ml  Output              200 ml  Net           241.34 ml   Filed Weights   03/14/16 0604 03/18/16  0918 03/19/16 1116  Weight: 91.6 kg (201 lb 15.1 oz) 91.2 kg (201 lb) 84.8 kg (187 lb)    Examination:  General exam: Alert today  Respiratory system: Clear to auscultation. Respiratory effort normal. Cardiovascular system: S1 & S2 heard, RRR. No JVD, murmurs, rubs, gallops or clicks. No pedal edema. Gastrointestinal system: Abdomen is nondistended, soft and nontender. No organomegaly or masses felt. Normal bowel sounds heard. Central nervous system: Alert and oriented. No focal neurological deficits. Extremities: Symmetric 5 x 5 power. Skin: No rashes, lesions or ulcers     Data Reviewed: I have personally reviewed following labs and imaging studies  CBC:  Recent Labs Lab 03/13/16 1044 03/13/16 1047 03/14/16 0553 03/15/16 0819 03/16/16 0725 03/18/16 0517  WBC 8.9  --  7.6 7.4 7.2 9.7  NEUTROABS 7.2  --   --   --   --   --   HGB 14.3 15.0 12.6 14.0 13.2 15.5*  HCT 41.5 44.0 37.3 40.6 40.5 45.9  MCV 89.1  --  89.9 88.1 90.2 87.9  PLT 174  --  157 163 177 912   Basic Metabolic Panel:  Recent Labs Lab 03/13/16 2215 03/14/16 0553 03/15/16 0819 03/16/16 0725 03/17/16 1042 03/18/16 0517 03/19/16 0612  NA  --  135 138 136 135 135 136  K  --  4.2 3.3* 3.8 3.5 3.3* 3.6  CL  --  103 104 103 98* 96* 100*  CO2  --  '24 27 24 26 27 28  ' GLUCOSE  --  92 118* 108* 120* 104* 103*  BUN  --  '8 8 8 15 16 15  ' CREATININE  --  0.65 0.65 0.62 0.71 0.84 0.94  CALCIUM  --  8.0* 8.4* 8.6* 9.0 9.3 9.2  MG 2.3 2.0  --   --   --   --   --   PHOS 2.1* 2.1*  --  2.1*  --  4.8* 3.8   GFR: Estimated Creatinine Clearance: 64.1 mL/min (by C-G formula based on SCr of 0.94 mg/dL). Liver Function Tests:  Recent Labs Lab 03/13/16 1044 03/14/16 0553 03/18/16 1538  AST '24 20 29  ' ALT 32 27 48  ALKPHOS 54 48 51  BILITOT 1.2 0.8 1.2  PROT 6.4* 5.7* 7.0  ALBUMIN 4.0 3.5 4.4    Recent Labs Lab 03/13/16 1044 03/18/16 1538  LIPASE 26 63*   No results for input(s): AMMONIA in the last  168 hours. Coagulation Profile: No results for input(s): INR, PROTIME in the last 168 hours. Cardiac Enzymes:  Recent Labs Lab 03/16/16 1006 03/16/16 1646 03/16/16 2143  TROPONINI 0.05* 0.04* 0.03*   BNP (last 3 results) No results for input(s): PROBNP in the last 8760 hours. HbA1C: No results for input(s): HGBA1C in the last 72 hours. CBG: No results for input(s): GLUCAP in the last 168 hours. Lipid Profile: No  results for input(s): CHOL, HDL, LDLCALC, TRIG, CHOLHDL, LDLDIRECT in the last 72 hours. Thyroid Function Tests: No results for input(s): TSH, T4TOTAL, FREET4, T3FREE, THYROIDAB in the last 72 hours. Anemia Panel: No results for input(s): VITAMINB12, FOLATE, FERRITIN, TIBC, IRON, RETICCTPCT in the last 72 hours. Sepsis Labs:  Recent Labs Lab 03/13/16 2229  LATICACIDVEN 0.83    Recent Results (from the past 240 hour(s))  Urine culture     Status: Abnormal   Collection Time: 03/11/16  3:45 AM  Result Value Ref Range Status   Specimen Description URINE, RANDOM  Final   Special Requests NONE  Final   Culture (A)  Final    <10,000 COLONIES/mL INSIGNIFICANT GROWTH Performed at [facility], [place] [address], [place], [place] [address]    Report Status 03/12/2016 FINAL  Final  Blood culture (routine x 2)     Status: None   Collection Time: 03/13/16  9:50 PM  Result Value Ref Range Status   Specimen Description BLOOD LEFT WRIST  Final   Special Requests BOTTLES DRAWN AEROBIC AND ANAEROBIC 5ML  Final   Culture   Final    NO GROWTH 5 DAYS Performed at [facility], [place] [address] [place], [place] [address]    Report Status 03/18/2016 FINAL  Final  Blood culture (routine x 2)     Status: None   Collection Time: 03/13/16 10:15 PM  Result Value Ref Range Status   Specimen Description BLOOD RIGHT FOREARM  Final   Special Requests BOTTLES DRAWN AEROBIC AND ANAEROBIC 5ML  Final   Culture   Final    NO GROWTH 5 DAYS Performed at [facility],  [place] [address] [place],  [address]    Report Status 03/18/2016 FINAL  Final         Radiology Studies: No results found.      Scheduled Meds: . amLODipine  10 mg Oral Daily  . citalopram  20 mg Oral Daily  . feeding supplement (ENSURE ENLIVE)  237 mL Oral BID BM  . gabapentin  100 mg Oral QHS  . hydrALAZINE  25 mg Oral Q8H  . hydrochlorothiazide  25 mg Oral Daily  . losartan  100 mg Oral Daily  . metoprolol  50 mg Oral Q breakfast  . ondansetron (ZOFRAN) IV  4 mg Intravenous Q8H  . pantoprazole  40 mg Oral BID  . senna-docusate  1 tablet Oral BID   Continuous Infusions: . sodium chloride 20 mL/hr at 03/18/16 1843     LOS: 5 days    Time spent: 35 minutes.     Elmarie Shiley, MD [facility] Pager [phone]  If 7PM-7AM, please contact night-coverage [email or web address] Password [email or web address] 03/19/2016, 3:28 PM

## 2016-03-19 NOTE — Progress Notes (Signed)
OT Cancellation Note  Patient Details Name: Lia HoppingRae Michele Pepperman MRN: [id number] DOB: 03/08/1955   Cancelled Treatment:    Reason Eval/Treat Not Completed: Other (comment).  Pt is nauseous. Will try to check back later.    Hadlyn Amero 03/19/2016, 11:30 AM  Marica OtterMaryellen Del Overfelt, OTR/L [phone] 03/19/2016

## 2016-03-19 NOTE — Progress Notes (Signed)
Addendum to earlier note today:  Review of blood work from yesterday showed normal liver chemistries, and mild elevation of lipase (63, normal up to 51), which I feel is not clinically significant in view of the resolution of her left flank pain, the fact that her lipase was actually normal at the time of admission about a week ago when she was more acutely symptomatic, and in view of the fact that the pancreas appeared normal on review of her CT scan from a week ago (per discussion with the radiologist over the telephone).  Florencia Reasonsobert V. Romanda Turrubiates, M.D. Pager [phone] If no answer or after 5 PM call [phone]

## 2016-03-19 NOTE — Anesthesia Postprocedure Evaluation (Signed)
Anesthesia Post Note  Patient: Sheryl Bates  Procedure(s) Performed: Procedure(s) (LRB): ESOPHAGOGASTRODUODENOSCOPY (EGD) (N/A)  Patient location during evaluation: PACU Anesthesia Type: MAC Level of consciousness: awake and alert Pain management: pain level controlled Vital Signs Assessment: post-procedure vital signs reviewed and stable Respiratory status: spontaneous breathing, nonlabored ventilation, respiratory function stable and patient connected to nasal cannula oxygen Cardiovascular status: stable and blood pressure returned to baseline Anesthetic complications: no       Last Vitals:  Vitals:   03/18/16 2105 03/19/16 0623  BP: (!) 152/72 138/82  Pulse: 79 68  Resp: 19 19  Temp: 37 C 36.9 C    Last Pain:  Vitals:   03/19/16 0623  TempSrc: Oral  PainSc:                  [place]

## 2016-03-20 DIAGNOSIS — M545 Low back pain: Secondary | ICD-10-CM

## 2016-03-20 DIAGNOSIS — I10 Essential (primary) hypertension: Secondary | ICD-10-CM

## 2016-03-20 DIAGNOSIS — R112 Nausea with vomiting, unspecified: Secondary | ICD-10-CM

## 2016-03-20 MED ORDER — ENOXAPARIN SODIUM 40 MG/0.4ML ~~LOC~~ SOLN
40.0000 mg | SUBCUTANEOUS | Status: DC
  Administered 2016-03-20 – 2016-03-21 (×2): 40 mg via SUBCUTANEOUS
  Filled 2016-03-20 (×2): qty 0.4

## 2016-03-20 MED ORDER — ACETAMINOPHEN 650 MG RE SUPP: 650.0000 mg | Freq: Four times a day (QID) | RECTAL | Status: DC | PRN

## 2016-03-20 MED ORDER — ACETAMINOPHEN 325 MG PO TABS: 650.0000 mg | ORAL_TABLET | Freq: Four times a day (QID) | ORAL | Status: DC | PRN

## 2016-03-20 MED ORDER — GABAPENTIN 100 MG PO CAPS: 100.0000 mg | ORAL_CAPSULE | Freq: Three times a day (TID) | ORAL | Status: DC | PRN

## 2016-03-20 NOTE — Progress Notes (Signed)
Patient is feeling somewhat better despite transient episode of nausea and vomiting yesterday afternoon after starting frequent small aliquots of clear liquids, which we think may have been a reaction to some of the cleaning and fluids being used in her room at the time by housekeeping.  In any event, the patient's attending physician has started her on a soft diet, and I am okay with this, but have advised the patient to take very small amounts at a time, no more than she could hold in the palm of her hand. If this is not tolerated, we can go back to aliquots of clear liquids, in which case I would probably try 120 mL's every 1 to 2 hours while awake.  Endoscopic biopsies (duodenum and gastric antrum) were normal, specifically no evidence of celiac disease, inflammation, or Helicobacter pylori infection. Patient advised. No follow-up endoscopy needed.  No additional suggestions at present.  Florencia Reasonsobert V. Amberlea Spagnuolo, M.D. Pager [phone] If no answer or after 5 PM call [phone]

## 2016-03-20 NOTE — Progress Notes (Signed)
PROGRESS NOTE  Sheryl Bates  QHU:[id number] DOB: 27-Nov-1955 DOA: 03/13/2016 PCP: Henrine Screws, MD  Brief Narrative:   Sheryl Bloodgood Pridgenis a 61 y.o.femalewith medical history significant of HTN endometriosis HLD who presents with nausea, vomiting and left flank pain.  Left flank pain is likely due to lumbar radiculopathy which has improved since a steroid injection.  She underwent upper endoscopty which was negative.  H. Pylori, celiac negative.  She has been tolerating small amounts of liquid with a set back yesterday due to nausea induced by the odor of a cleaning supply.  She is requesting solid foods today.  Advancing diet.    Assessment & Plan:   Principal Problem:   Nausea & vomiting Active Problems:   Hypertension   Hyperlipidemia   Hypokalemia   Dehydration   Back pain  Flank Pain due to inflammation at the L3-L4 level.  UA with trace of hb. CT renal with left renal stone, non obstructing.  Urology consulted but did not feel stone explained pain CT angio abdomen and pelvis on 1-28 without significant finding MRI spine demonstrated evidence of inflammation around the disc space at L2-3 and L3-4.  ESR and CRP were normal. Underwent steroid injection on 2/2 by IR which has helped Blood culture no growth to date Dc narcotics Tylenol for pain  Nausea, vomiting improving, possibly due to pain or pain medications Had CT angio abdomen and pelvis on 1-28 without significant finding. KUB negative for obstruction.  LFT normal. Lipase normal.  GI consulted. Endoscopy negative for gastric outlet obstruction.  Advance to low residue diet  HTN, severe, labile likely due to nausea and vomiting Continue with  metoprolol and Cozaar, norvasc. Hydralazine. HCTZ.  MRI negative for Stroke.   Generalized weakness TSH normal  Troponin mildly elevated, discussed with cardiology non specific pattern.  -  ECHO normal EF, diastolic dysfunction  -  F/u with cardiology as  outpatient -  Tele:  PVC, otherwise SR -  D/c telemetry  Hypophosphatemia; replaced orally.   DVT prophylaxis:  lovenox Code Status:  full Family Communication:  Patient and her husband Disposition Plan:  Pending tolerating a soft diet, improving nausea and vomiting   Consultants:   GI, Dr. Cristina Gong  Neurosurgery, Dr. Ellene Route  Urology, Dr. Junious Silk  IR  Procedures:  steroid injection on 2/2 by IR EGD on 03/18/2016  Antimicrobials:  Anti-infectives    None       Subjective: Thinks that she would feel better if she was able to eat solid foods.  The smell of ensure makes her feel sick to her stomach  Objective: Vitals:   03/19/16 2033 03/20/16 0457 03/20/16 1002 03/20/16 1455  BP: 130/64 (!) 145/78 (!) 164/88 116/70  Pulse: 80 61  (!) 58  Resp: _0 Temp: 99.2 F (37.3 C) 98.4 F (36.9 C)  97.4 F (36.3 C)  TempSrc: Oral Oral  Axillary  SpO2: 97% 97%  98%  Weight:      Height:        Intake/Output Summary (Last 24 hours) at 03/20/16 1504 Last data filed at 03/19/16 1835  Gross per 24 hour  Intake           333.67 ml  Output                0 ml  Net           333.67 ml   Filed Weights   03/14/16 0604 03/18/16 0918 03/19/16 1116  Weight: 91.6 kg (201 lb 15.1 oz) 91.2 kg (201 lb) 84.8 kg (187 lb)    Examination:  General exam:  Adult female.  No acute distress.  HEENT:  NCAT, MMM Respiratory system: Clear to auscultation bilaterally Cardiovascular system: Regular rate and rhythm, normal S1/S2. No murmurs, rubs, gallops or clicks.  Warm extremities Gastrointestinal system: Normal active bowel sounds, soft, nondistended, nontender. MSK:  Normal tone and bulk, no lower extremity edema Neuro:  Grossly intact    Data Reviewed: I have personally reviewed following labs and imaging studies  CBC:  Recent Labs Lab 03/14/16 0553 03/15/16 0819 03/16/16 0725 03/18/16 0517  WBC 7.6 7.4 7.2 9.7  HGB 12.6 14.0 13.2 15.5*  HCT 37.3 40.6 40.5 45.9    MCV 89.9 88.1 90.2 87.9  PLT 157 163 177 675   Basic Metabolic Panel:  Recent Labs Lab 03/13/16 2215  03/14/16 0553 03/15/16 0819 03/16/16 0725 03/17/16 1042 03/18/16 0517 03/19/16 0612  NA  --   < > 135 138 136 135 135 136  K  --   < > 4.2 3.3* 3.8 3.5 3.3* 3.6  CL  --   < > 103 104 103 98* 96* 100*  CO2  --   < > _0 GLUCOSE  --   < > 92 118* 108* 120* 104* 103*  BUN  --   < > _1 CREATININE  --   < > 0.65 0.65 0.62 0.71 0.84 0.94  CALCIUM  --   < > 8.0* 8.4* 8.6* 9.0 9.3 9.2  MG 2.3  --  2.0  --   --   --   --   --   PHOS 2.1*  --  2.1*  --  2.1*  --  4.8* 3.8  < > = values in this interval not displayed. GFR: Estimated Creatinine Clearance: 64.1 mL/min (by C-G formula based on SCr of 0.94 mg/dL). Liver Function Tests:  Recent Labs Lab 03/14/16 0553 03/18/16 1538  AST 20 29  ALT 27 48  ALKPHOS 48 51  BILITOT 0.8 1.2  PROT 5.7* 7.0  ALBUMIN 3.5 4.4    Recent Labs Lab 03/18/16 1538  LIPASE 63*   No results for input(s): AMMONIA in the last 168 hours. Coagulation Profile: No results for input(s): INR, PROTIME in the last 168 hours. Cardiac Enzymes:  Recent Labs Lab 03/16/16 1006 03/16/16 1646 03/16/16 2143  TROPONINI 0.05* 0.04* 0.03*   BNP (last 3 results) No results for input(s): PROBNP in the last 8760 hours. HbA1C: No results for input(s): HGBA1C in the last 72 hours. CBG: No results for input(s): GLUCAP in the last 168 hours. Lipid Profile: No results for input(s): CHOL, HDL, LDLCALC, TRIG, CHOLHDL, LDLDIRECT in the last 72 hours. Thyroid Function Tests: No results for input(s): TSH, T4TOTAL, FREET4, T3FREE, THYROIDAB in the last 72 hours. Anemia Panel: No results for input(s): VITAMINB12, FOLATE, FERRITIN, TIBC, IRON, RETICCTPCT in the last 72 hours. Urine analysis:    Component Value Date/Time   COLORURINE YELLOW 03/13/2016 0929   APPEARANCEUR CLOUDY (A) 03/13/2016 0929   LABSPEC 1.014 03/13/2016 0929    PHURINE 7.0 03/13/2016 0929   GLUCOSEU NEGATIVE 03/13/2016 0929   HGBUR SMALL (A) 03/13/2016 0929   BILIRUBINUR NEGATIVE 03/13/2016 0929   BILIRUBINUR n 07/19/2013 1200   KETONESUR 80 (A) 03/13/2016 0929   PROTEINUR 30 (A) 03/13/2016 0929   UROBILINOGEN 0.2 07/19/2013 1200   UROBILINOGEN 0.2 12/31/2011  [place] 03/13/2016 0929   LEUKOCYTESUR SMALL (A) 03/13/2016 0929   Sepsis Labs: _0 (procalcitonin:4,lacticidven:4)  ) Recent Results (from the past 240 hour(s))  Urine culture     Status: Abnormal   Collection Time: 03/11/16  3:45 AM  Result Value Ref Range Status   Specimen Description URINE, RANDOM  Final   Special Requests NONE  Final   Culture (A)  Final    <10,000 COLONIES/mL INSIGNIFICANT GROWTH Performed at [facility], [place] [address], [place], [place] [address]    Report Status 03/12/2016 FINAL  Final  Blood culture (routine x 2)     Status: None   Collection Time: 03/13/16  9:50 PM  Result Value Ref Range Status   Specimen Description BLOOD LEFT WRIST  Final   Special Requests BOTTLES DRAWN AEROBIC AND ANAEROBIC 5ML  Final   Culture   Final    NO GROWTH 5 DAYS Performed at [facility], [place] [address], [place], [place] [address]    Report Status 03/18/2016 FINAL  Final  Blood culture (routine x 2)     Status: None   Collection Time: 03/13/16 10:15 PM  Result Value Ref Range Status   Specimen Description BLOOD RIGHT FOREARM  Final   Special Requests BOTTLES DRAWN AEROBIC AND ANAEROBIC 5ML  Final   Culture   Final    NO GROWTH 5 DAYS Performed at [facility], [place] [address] [place], [place] [address]    Report Status 03/18/2016 FINAL  Final      Radiology Studies: No results found.   Scheduled Meds: . amLODipine  10 mg Oral Daily  . citalopram  20 mg Oral Daily  . gabapentin  100 mg Oral QHS  . hydrALAZINE  25 mg Oral Q8H  . hydrochlorothiazide  25 mg Oral Daily  . losartan  100 mg Oral Daily  . metoprolol   50 mg Oral Q breakfast  . ondansetron (ZOFRAN) IV  4 mg Intravenous Q8H  . pantoprazole  40 mg Oral BID  . senna-docusate  1 tablet Oral BID   Continuous Infusions: . sodium chloride 20 mL/hr at 03/19/16 1835     LOS: 6 days    Time spent: 30 min    Janece Canterbury, MD [facility] Pager [phone]  If 7PM-7AM, please contact night-coverage [email or web address] Password [email or web address] 03/20/2016, 3:04 PM

## 2016-03-20 NOTE — Progress Notes (Signed)
OT Cancellation Note  Patient Details Name: Sheryl Bates MRN: [id number] DOB: 07/26/1955   Cancelled Treatment:    Reason Eval/Treat Not Completed: OT screened, no needs identified, will sign off.  Saw pt walking out of bathroom at mod I level. She feels that she can do things for herself, just needs frequent rest breaks due to decreased endurance.  Briefly reviewed energy conservation with her.  She has a high commode and husband just bought her a shower seat.  Will sign off.   Rickell Wiehe 03/20/2016, 11:11 AM  Marica OtterMaryellen Cirilo Canner, OTR/L [phone] 03/20/2016

## 2016-03-20 NOTE — Progress Notes (Signed)
Physical Therapy Treatment Patient Details Name: Sheryl Bates MRN: [id number] DOB: 06/02/1955 Today's Date: 03/20/2016    History of Present Illness 61 y.o.female who presents with nausea, vomiting, Lt flank pain. PMH: HTN.    PT Comments    Pt progressing well with mobility, she ambulated 180' holding IV pole, no loss of balance. No physical assist needed for bed mobility nor for transfers.   Follow Up Recommendations  Supervision for mobility/OOB     Equipment Recommendations  None recommended by PT (at this time-continuing to assess)    Recommendations for Other Services       Precautions / Restrictions Precautions Precautions: Fall Restrictions Weight Bearing Restrictions: No    Mobility  Bed Mobility Overal bed mobility: Modified Independent                Transfers Overall transfer level: Needs assistance Equipment used: None Transfers: Sit to/from Stand Sit to Stand: Independent         General transfer comment: steady  Ambulation/Gait Ambulation/Gait assistance: Supervision Ambulation Distance (Feet): 180 Feet Assistive device:  (IV pole) Gait Pattern/deviations: Step-through pattern;Decreased stride length     General Gait Details: steady, no LOB, no pain with ambulation, distance limited by fatigue   Stairs            Wheelchair Mobility    Modified Rankin (Stroke Patients Only)       Balance     Sitting balance-Leahy Scale: Good       Standing balance-Leahy Scale: Good                      Cognition Arousal/Alertness: Awake/alert Behavior During Therapy: WFL for tasks assessed/performed Overall Cognitive Status: Within Functional Limits for tasks assessed                      Exercises      General Comments        Pertinent Vitals/Pain Pain Assessment: No/denies pain    Home Living                      Prior Function            PT Goals (current goals can now be found  in the care plan section) Acute Rehab PT Goals 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[employer]) PT Goal Formulation: With patient/family Time For Goal Achievement: 03/31/16 Potential to Achieve Goals: Good Progress towards PT goals: Progressing toward goals    Frequency    Min 3X/week      PT Plan Current plan remains appropriate    Co-evaluation             End of Session Equipment Utilized During Treatment: Gait belt Activity Tolerance: Patient tolerated treatment well (Limited by dizziness) Patient left: with call bell/phone within reach;with family/visitor present;in chair     Time: [id number] PT Time Calculation (min) (ACUTE ONLY): 10 min  Charges:  $Gait Training: 8-22 mins                    G Codes:      Sheryl Bates, Sheryl Bates 03/20/2016, 1:27 PM [phone]

## 2016-03-21 LAB — BASIC METABOLIC PANEL
Anion gap: 9 (ref 5–15)
BUN: 15 mg/dL (ref 6–20)
CO2: 25 mmol/L (ref 22–32)
Calcium: 9.1 mg/dL (ref 8.9–10.3)
Chloride: 101 mmol/L (ref 101–111)
Creatinine, Ser: 0.79 mg/dL (ref 0.44–1.00)
GFR calc Af Amer: >60 mL/min (ref >60)
GFR calc non Af Amer: >60 mL/min (ref >60)
Glucose, Bld: 97 mg/dL (ref 65–99)
Potassium: 3.6 mmol/L (ref 3.5–5.1)
Sodium: 135 mmol/L (ref 135–145)

## 2016-03-21 NOTE — Progress Notes (Signed)
Physical Therapy Treatment Patient Details Name: Sheryl Bates MRN: [id number] DOB: 07/31/1955 Today's Date: 03/21/2016    History of Present Illness 61 y.o.female who presents with nausea, vomiting, Lt flank pain. PMH: HTN.    PT Comments    Pt continues to have fluctuating progress with respect to mobility. Pt reports weakness and fatigue on today. She was able to walk ~100 feet while holding on to IV pole. Will continue to follow. May need HHPT follow up at discharge-continuing to assess.    Follow Up Recommendations  Home health PT;Supervision for mobility/OOB (depending on progress. )     Equipment Recommendations   (continuing to assess)    Recommendations for Other Services       Precautions / Restrictions Precautions Precautions: Sternal Restrictions Weight Bearing Restrictions: No    Mobility  Bed Mobility               General bed mobility comments: pt walking back from bathroom alone when therapist entered  Transfers     Transfers: Sit to/from Stand Sit to Stand: Modified independent (Device/Increase time)            Ambulation/Gait Ambulation/Gait assistance: Min guard Ambulation Distance (Feet): 100 Feet Assistive device:  (IV pole)       General Gait Details: close guard for safety. Pt reported feeling weak. She continues to fatigue easily. Shorter ambulation distance on today.    Stairs            Wheelchair Mobility    Modified Rankin (Stroke Patients Only)       Balance                                    Cognition Arousal/Alertness: Awake/alert Behavior During Therapy: WFL for tasks assessed/performed Overall Cognitive Status: Within Functional Limits for tasks assessed                      Exercises      General Comments        Pertinent Vitals/Pain Pain Assessment: No/denies pain    Home Living                      Prior Function            PT Goals (current  goals can now be found in the care plan section) Progress towards PT goals: Progressing toward goals (slowly)    Frequency    Min 3X/week      PT Plan Current plan remains appropriate    Co-evaluation             End of Session   Activity Tolerance: Patient limited by fatigue Patient left: in bed;with call bell/phone within reach     Time: 1523-1531 PT Time Calculation (min) (ACUTE ONLY): 8 min  Charges:  $Gait Training: 8-22 mins                    G Codes:      Rebeca AlertJannie Malinda Mayden, MPT Pager: [phone]

## 2016-03-21 NOTE — Progress Notes (Signed)
Making steady progress. No vomiting past 24 hours. Still some low-grade, background nausea which periodically becomes more intense.  Is tolerating small amounts of solid food.  Impression: Gradual progress with dietary progression in the setting of presumed gastric dysmotility, probably the consequence of medical illness, specifically vagal reaction from lumbar pain and associated pain medication.  Recommendation: Continue current management. In view of the patient's improvement, I would estimate that she will probably be discharged in 1-2 days.  Florencia Reasonsobert V. Kaaliyah Kita, M.D. Pager [phone] If no answer or after 5 PM call [phone]

## 2016-03-21 NOTE — Progress Notes (Addendum)
PROGRESS NOTE  Sheryl Bates  QJS:[id number] DOB: 08-19-1955 DOA: 03/13/2016 PCP: Henrine Screws, MD  Brief Narrative:   Sheryl Wyble Pridgenis a 61 y.o.femalewith medical history significant of HTN endometriosis HLD who presents with nausea, vomiting and left flank pain.  Left flank pain is likely due to lumbar radiculopathy which has improved since a steroid injection.  She underwent upper endoscopty which was negative.  H. Pylori, celiac negative.  She has been tolerating small amounts of liquid with a set back yesterday due to nausea induced by the odor of a cleaning supply.  She is requesting solid foods today.  Advancing diet.    Assessment & Plan:   Principal Problem:   Nausea & vomiting Active Problems:   Hypertension   Hyperlipidemia   Hypokalemia   Dehydration   Back pain  Flank Pain due to inflammation at the L3-L4 level.  UA with trace of hb. CT renal with left renal stone, non obstructing.  Urology consulted but did not feel stone explained pain CT angio abdomen and pelvis on 1-28 without significant finding MRI spine demonstrated evidence of inflammation around the disc space at L2-3 and L3-4.  ESR and CRP were normal. Underwent steroid injection on 2/2 by IR which has helped Blood culture no growth to date Tylenol for pain  Nausea, vomiting improving, possibly due to pain or pain medications Had CT angio abdomen and pelvis on 1-28 without significant finding. KUB negative for obstruction.  LFT normal. Lipase normal.  GI consulted. Endoscopy negative for gastric outlet obstruction.  Encouraged solid food "grazing" today  HTN, severe, labile likely due to nausea and vomiting Continue with  metoprolol and Cozaar, norvasc. Hydralazine. HCTZ.  MRI negative for Stroke.   Generalized weakness TSH normal  Troponin mildly elevated, discussed with cardiology non specific pattern.  -  ECHO normal EF, diastolic dysfunction  -  F/u with cardiology as  outpatient  Hypophosphatemia; replaced orally.   DVT prophylaxis:  lovenox Code Status:  full Family Communication:  Patient and her husband Disposition Plan:  Pending tolerating a soft diet, improving nausea and vomiting.  Possibly tomorrow   Consultants:   GI, Dr. Cristina Gong  Neurosurgery, Dr. Ellene Route  Urology, Dr. Junious Silk  IR  Procedures:  steroid injection on 2/2 by IR EGD on 03/18/2016  Antimicrobials:  Anti-infectives    None       Subjective: Ate a few bites of rice and two cups of applesauce yesterday.   Objective: Vitals:   03/20/16 1455 03/20/16 2034 03/21/16 0549 03/21/16 1511  BP: 116/70 118/75 (!) 155/73 (!) 104/51  Pulse: (!) 58 63 65 (!) 59  Resp: '18 18 18 18  ' Temp: 97.4 F (36.3 C) 99 F (37.2 C) 98 F (36.7 C) 98.8 F (37.1 C)  TempSrc: Axillary Oral Oral Oral  SpO2: 98% 98% 97% 97%  Weight:      Height:        Intake/Output Summary (Last 24 hours) at 03/21/16 1524 Last data filed at 03/21/16 0600  Gross per 24 hour  Intake              220 ml  Output                0 ml  Net              220 ml   Filed Weights   03/14/16 0604 03/18/16 0918 03/19/16 1116  Weight: 91.6 kg (201 lb 15.1 oz) 91.2 kg (201 lb) 84.8 kg (  187 lb)    Examination:  General exam:  Adult female.  No acute distress.  Chewing gum  HEENT:  NCAT, MMM Respiratory system: Clear to auscultation bilaterally Cardiovascular system: Regular rate and rhythm, normal S1/S2. No murmurs, rubs, gallops or clicks.  Warm extremities Gastrointestinal system: Normal active bowel sounds, soft, nondistended, nontender. MSK:  Normal tone and bulk, no lower extremity edema Neuro:  Grossly intact    Data Reviewed: I have personally reviewed following labs and imaging studies  CBC:  Recent Labs Lab 03/15/16 0819 03/16/16 0725 03/18/16 0517  WBC 7.4 7.2 9.7  HGB 14.0 13.2 15.5*  HCT 40.6 40.5 45.9  MCV 88.1 90.2 87.9  PLT 163 177 146   Basic Metabolic Panel:  Recent  Labs Lab 03/16/16 0725 03/17/16 1042 03/18/16 0517 03/19/16 0612 03/21/16 0541  NA 136 135 135 136 135  K 3.8 3.5 3.3* 3.6 3.6  CL 103 98* 96* 100* 101  CO2 '24 26 27 28 25  ' GLUCOSE 108* 120* 104* 103* 97  BUN '8 15 16 15 15  ' CREATININE 0.62 0.71 0.84 0.94 0.79  CALCIUM 8.6* 9.0 9.3 9.2 9.1  PHOS 2.1*  --  4.8* 3.8  --    GFR: Estimated Creatinine Clearance: 75.3 mL/min (by C-G formula based on SCr of 0.79 mg/dL). Liver Function Tests:  Recent Labs Lab 03/18/16 1538  AST 29  ALT 48  ALKPHOS 51  BILITOT 1.2  PROT 7.0  ALBUMIN 4.4    Recent Labs Lab 03/18/16 1538  LIPASE 63*   No results for input(s): AMMONIA in the last 168 hours. Coagulation Profile: No results for input(s): INR, PROTIME in the last 168 hours. Cardiac Enzymes:  Recent Labs Lab 03/16/16 1006 03/16/16 1646 03/16/16 2143  TROPONINI 0.05* 0.04* 0.03*   BNP (last 3 results) No results for input(s): PROBNP in the last 8760 hours. HbA1C: No results for input(s): HGBA1C in the last 72 hours. CBG: No results for input(s): GLUCAP in the last 168 hours. Lipid Profile: No results for input(s): CHOL, HDL, LDLCALC, TRIG, CHOLHDL, LDLDIRECT in the last 72 hours. Thyroid Function Tests: No results for input(s): TSH, T4TOTAL, FREET4, T3FREE, THYROIDAB in the last 72 hours. Anemia Panel: No results for input(s): VITAMINB12, FOLATE, FERRITIN, TIBC, IRON, RETICCTPCT in the last 72 hours. Urine analysis:    Component Value Date/Time   COLORURINE YELLOW 03/13/2016 0929   APPEARANCEUR CLOUDY (A) 03/13/2016 0929   LABSPEC 1.014 03/13/2016 0929   PHURINE 7.0 03/13/2016 0929   GLUCOSEU NEGATIVE 03/13/2016 0929   HGBUR SMALL (A) 03/13/2016 0929   BILIRUBINUR NEGATIVE 03/13/2016 0929   BILIRUBINUR n 07/19/2013 1200   KETONESUR 80 (A) 03/13/2016 0929   PROTEINUR 30 (A) 03/13/2016 0929   UROBILINOGEN 0.2 07/19/2013 1200   UROBILINOGEN 0.2 12/31/2011 1629   NITRITE NEGATIVE 03/13/2016 0929   LEUKOCYTESUR  SMALL (A) 03/13/2016 0929   Sepsis Labs: '@LABRCNTIP' (procalcitonin:4,lacticidven:4)  ) Recent Results (from the past 240 hour(s))  Blood culture (routine x 2)     Status: None   Collection Time: 03/13/16  9:50 PM  Result Value Ref Range Status   Specimen Description BLOOD LEFT WRIST  Final   Special Requests BOTTLES DRAWN AEROBIC AND ANAEROBIC 5ML  Final   Culture   Final    NO GROWTH 5 DAYS Performed at [facility], [place] [address], [place], [place] [address]    Report Status 03/18/2016 FINAL  Final  Blood culture (routine x 2)     Status: None  Collection Time: 03/13/16 10:15 PM  Result Value Ref Range Status   Specimen Description BLOOD RIGHT FOREARM  Final   Special Requests BOTTLES DRAWN AEROBIC AND ANAEROBIC 5ML  Final   Culture   Final    NO GROWTH 5 DAYS Performed at [facility], [address] [place], [place] [address]    Report Status 03/18/2016 FINAL  Final      Radiology Studies: No results found.   Scheduled Meds: . amLODipine  10 mg Oral Daily  . citalopram  20 mg Oral Daily  . enoxaparin (LOVENOX) injection  40 mg Subcutaneous Q24H  . gabapentin  100 mg Oral QHS  . hydrALAZINE  25 mg Oral Q8H  . hydrochlorothiazide  25 mg Oral Daily  . losartan  100 mg Oral Daily  . metoprolol  50 mg Oral Q breakfast  . ondansetron (ZOFRAN) IV  4 mg Intravenous Q8H  . pantoprazole  40 mg Oral BID  . senna-docusate  1 tablet Oral BID   Continuous Infusions: . sodium chloride 20 mL/hr at 03/20/16 2108     LOS: 7 days    Time spent: 30 min    Janece Canterbury, MD [facility] Pager [phone]  If 7PM-7AM, please contact night-coverage [email or web address] Password [email or web address] 03/21/2016, 3:24 PM

## 2016-03-22 DIAGNOSIS — E785 Hyperlipidemia, unspecified: Secondary | ICD-10-CM

## 2016-03-22 DIAGNOSIS — E860 Dehydration: Secondary | ICD-10-CM

## 2016-03-22 LAB — BASIC METABOLIC PANEL
Anion gap: 8 (ref 5–15)
BUN: 15 mg/dL (ref 6–20)
CO2: 25 mmol/L (ref 22–32)
Calcium: 9 mg/dL (ref 8.9–10.3)
Chloride: 101 mmol/L (ref 101–111)
Creatinine, Ser: 0.86 mg/dL (ref 0.44–1.00)
GFR calc Af Amer: >60 mL/min (ref >60)
GFR calc non Af Amer: >60 mL/min (ref >60)
Glucose, Bld: 109 mg/dL — ABNORMAL HIGH (ref 65–99)
Potassium: 3.5 mmol/L (ref 3.5–5.1)
Sodium: 134 mmol/L — ABNORMAL LOW (ref 135–145)

## 2016-03-22 MED ORDER — ONDANSETRON 4 MG PO TBDP: 4.0000 mg | ORAL_TABLET | Freq: Three times a day (TID) | ORAL | 0 refills | Status: DC | PRN

## 2016-03-22 MED ORDER — OMEPRAZOLE 40 MG PO CPDR: 40.0000 mg | DELAYED_RELEASE_CAPSULE | Freq: Every day | ORAL | 0 refills | Status: DC

## 2016-03-22 MED ORDER — AMLODIPINE BESYLATE 10 MG PO TABS: 10.0000 mg | ORAL_TABLET | Freq: Every day | ORAL | 0 refills | Status: DC

## 2016-03-22 MED ORDER — HYDRALAZINE HCL 25 MG PO TABS
25.0000 mg | ORAL_TABLET | Freq: Three times a day (TID) | ORAL | 0 refills | Status: DC
Start: 2016-03-22 — End: 2017-12-20

## 2016-03-22 MED ORDER — GABAPENTIN 100 MG PO CAPS: 100.0000 mg | ORAL_CAPSULE | Freq: Every day | ORAL | 0 refills | Status: DC

## 2016-03-22 NOTE — Discharge Summary (Addendum)
Physician Discharge Summary  Sheryl Bates OIZ:[id number] DOB: [birth year] DOA: 03/13/2016  PCP: Henrine Screws, MD  Admit date: 03/13/2016 Discharge date: 03/22/2016  Admitted From: home  Disposition:  home  Recommendations for Outpatient Follow-up:  1. Follow up with PCP in 1-2 weeks as needed 2. Cologuard referral via PCP 3. Referral to cardiology for minimally elevated troponin  Home Health:  [facility] PT recommended, but patient declined. Equipment/Devices:  none  Discharge Condition:  Stable, improved CODE STATUS:  full  Diet recommendation:  Low residue   Brief/Interim Summary:  Sheryl Bates a 61 y.o.femalewith medical history significant of HTN endometriosis HLD who presents with nausea, vomiting and left flank pain.  Left flank pain is likely due to lumbar radiculopathy which has improved since a steroid injection.  Due to ongoing nausea and vomiting, she underwent upper endoscopty which was negative.  H. Pylori, celiac negative biopsy tests were negative.  She was gradually able to tolerate sips of liquids and now some solid foods.  She was advised to continue gradually advancing her diet, avoiding high fiber foods for now and she does not need follow up with gastroenterology  Discharge Diagnoses:  Principal Problem:   Nausea & vomiting Active Problems:   Hypertension   Hyperlipidemia   Hypokalemia   Dehydration   Back pain  Flank Pain due to inflammation at the L3-L4 level.  UA with trace of hb. CT renal with left renal stone, non obstructing.  Urology consulted but did not feel stone explained pain CT angio abdomen and pelvis on 1-28 without significant finding MRI spine demonstrated evidence of inflammation around the disc space at L2-3 and L3-4. ESR and CRP were normal. Neurosurgery felt risk of infection was low She underwent steroid injection on 2/2 by IR which helped Blood culture no growth to date Tylenol for pain  Nausea, vomiting improved  very gradually, possibly due to pain or pain medications Had CT angio abdomen and pelvis on 1-28 without significant finding. KUB negative for obstruction.  LFT normal. Lipase normal.  GI consulted. Endoscopy negative for gastric outlet obstruction.  H. Pylori and celiac n egative Tolerated solid foods very gradually  HTN, severe, labile likely due to nausea and vomiting Continue withmetoprolol and Cozaar, norvasc. Hydralazine. HCTZ.  MRI negative for Stroke.   Generalized weakness TSH normal  Troponin mildly elevated,discussed with cardiology non specific pattern.  -  ECHO normal EF, diastolic dysfunction  -  F/u with cardiology as outpatient  Hypophosphatemia;replaced orally.   Discharge Instructions  Discharge Instructions    Call MD for:  difficulty breathing, headache or visual disturbances    Complete by:  As directed    Call MD for:  extreme fatigue    Complete by:  As directed    Call MD for:  hives    Complete by:  As directed    Call MD for:  persistant dizziness or light-headedness    Complete by:  As directed    Call MD for:  persistant nausea and vomiting    Complete by:  As directed    Call MD for:  severe uncontrolled pain    Complete by:  As directed    Call MD for:  temperature >100.4    Complete by:  As directed    Diet - low sodium heart healthy    Complete by:  As directed    Increase activity slowly    Complete by:  As directed        Medication List  STOP taking these medications   HYDROcodone-acetaminophen 5-325 MG tablet Commonly known as:  NORCO/VICODIN     TAKE these medications   amLODipine 10 MG tablet Commonly known as:  NORVASC Take 1 tablet (10 mg total) by mouth daily. Start taking on:  03/23/2016   aspirin EC 81 MG tablet Take 81 mg by mouth daily.   citalopram 20 MG tablet Commonly known as:  CELEXA Take 1 tablet (20 mg total) by mouth daily.   gabapentin 100 MG capsule Commonly known as:  NEURONTIN Take 1  capsule (100 mg total) by mouth at bedtime.   hydrALAZINE 25 MG tablet Commonly known as:  APRESOLINE Take 1 tablet (25 mg total) by mouth every 8 (eight) hours.   hydrochlorothiazide 25 MG tablet Commonly known as:  HYDRODIURIL TAKE ONE TABLET BY MOUTH ONCE DAILY   losartan 100 MG tablet Commonly known as:  COZAAR Take 1 tablet (100 mg total) by mouth daily. TAKE ONE TABLET BY MOUTH ONE TIME DAILY What changed:  additional instructions   metoprolol 50 MG tablet Commonly known as:  LOPRESSOR Take 1 tablet (50 mg total) by mouth 2 (two) times daily. What changed:  when to take this   omeprazole 40 MG capsule Commonly known as:  PRILOSEC Take 1 capsule (40 mg total) by mouth daily.   ondansetron 4 MG disintegrating tablet Commonly known as:  ZOFRAN ODT Take 1 tablet (4 mg total) by mouth every 8 (eight) hours as needed for nausea or vomiting.   VITAMIN D PO Take 2 capsules by mouth daily with breakfast.      Follow-up Information    GATES,ROBERT NEVILL, MD. Schedule an appointment as soon as possible for a visit.   Specialty:  Internal Medicine Why:  as needed Contact information: [address] [place] [address] [phone]          Allergies  Allergen Reactions  . Sulfonamide Derivatives Diarrhea and Nausea Only  . Lisinopril Cough    Consultations: [facility], Dr. Cristina Gong   Procedures/Studies: Dg Abd 1 View  Result Date: 03/17/2016 CLINICAL DATA:  Nausea, vomiting, central abdominal pain. EXAM: ABDOMEN - 1 VIEW COMPARISON:  None. FINDINGS: The bowel gas pattern is normal.  Left renal calculus is noted. IMPRESSION: Left renal calculus.  No evidence of bowel obstruction or ileus. Electronically Signed   By: Marijo Conception, M.D.   On: 03/17/2016 11:25   Mr Brain Wo Contrast  Result Date: 03/16/2016 CLINICAL DATA:  61 year old female with increasing headaches. Status post lumbar epidural steroid injection yesterday for severe  back pain. Initial encounter. EXAM: MRI HEAD WITHOUT CONTRAST TECHNIQUE: Multiplanar, multiecho pulse sequences of the brain and surrounding structures were obtained without intravenous contrast. COMPARISON:  Lumbar epidural injection image [id number]. Lumbar MRI 03/13/2016 FINDINGS: Brain: Cerebral volume is within normal limits for age. No restricted diffusion to suggest acute infarction. No midline shift, mass effect, evidence of mass lesion, ventriculomegaly, extra-axial collection or acute intracranial hemorrhage. Cervicomedullary junction and pituitary are within normal limits. Minimal to mild for age nonspecific scattered mostly subcortical cerebral white matter T2 and FLAIR hyperintensity. No cortical encephalomalacia. No chronic cerebral blood products. There is a chronic lacunar infarct in the left caudate nucleus. The other deep gray matter nuclei, the brainstem, and cerebellum are normal. Vascular: Major intracranial vascular flow voids are preserved. Skull and upper cervical spine: Negative. Normal bone marrow signal. Sinuses/Orbits: Normal orbits soft tissues. Moderate left maxillary sinus mucosal thickening with some bubbly opacity. Trace  paranasal sinus mucosal thickening elsewhere. Other: Grossly normal visualized internal auditory structures. Mastoids are clear. Negative scalp soft tissues. IMPRESSION: 1.  No acute intracranial abnormality. 2. Mild for age chronic small vessel disease. 3. Mild to moderate inflammatory changes in the left maxillary sinus. Electronically Signed   By: Odessa Fleming M.D.   On: 03/16/2016 11:08   Mr Thoracic Spine W Wo Contrast  Result Date: 03/13/2016 CLINICAL DATA:  62 y/o F; sudden onset left-sided lower back pain. Concern for epidural abscess. EXAM: MRI THORACIC AND LUMBAR SPINE WITHOUT AND WITH CONTRAST TECHNIQUE: Multiplanar and multiecho pulse sequences of the thoracic and lumbar spine were obtained without and with intravenous contrast. CONTRAST:  23mL MULTIHANCE  GADOBENATE DIMEGLUMINE 529 MG/ML IV SOLN COMPARISON:  CT chest, abdomen, and pelvis dated 03/11/2016. FINDINGS: MRI THORACIC SPINE FINDINGS Alignment:  Physiologic. Vertebrae: No fracture, evidence of discitis, or bone lesion. T8 inferior endplate nonenhancing Schmorl's node. Cord:  Normal signal and morphology. Paraspinal and other soft tissues: Negative. Disc levels: No significant disc displacement, foraminal narrowing, or canal stenosis. MRI LUMBAR SPINE FINDINGS Segmentation:  Standard. Alignment:  Physiologic. Vertebrae: There are enhancing central deformity within the an L2 superior endplate, L3 inferior end plate, at L4 superior endplate with edema and enhancement. At L2-3 there is central disc enhancement and edema in adjacent L2 and L3 endplates with enhancement. Conus medullaris: Extends to the L1-2 level and appears normal. Paraspinal and other soft tissues: No paravertebral inflammatory changes or enhancement. Disc levels: L1-2: Small disc bulge with mild foraminal narrowing. No significant canal stenosis. L2-3: Small disc bulge with mild foraminal and lateral recess narrowing. No significant canal stenosis. L3-4: Small disc bulge with mild foraminal and lateral recess narrowing. No significant canal stenosis. L4-5: Small disc bulge and mild bilateral facet hypertrophy greater on the right. Mild foraminal and lateral recess narrowing. L5-S1: Small central protrusion and moderate right-greater-than-left facet hypertrophy. No significant foraminal narrowing and canal stenosis. No significant canal stenosis. 7 x 9 mm synovial cyst in right paraspinal muscles. IMPRESSION: 1. Enhancing central and deformity within the L2 superior endplate, L3 inferior endplate, at L4 superior endplate with edema and enhancement. At L2-3 there is central disc enhancement extending into the L2 inferior endplate central deformity and enhanced throughout the adjacent L2 and L3 endplates. On prior CT these lesions have the  appearance of Schmorl's nodes. Enhancement within the L2-3 intervertebral disc could represent infection but there is no significant disc edema or surrounding soft tissue edema and infection is considered less likely. 2. Mild lumbar spondylosis with multiple levels of mild foraminal narrowing. 3. Normal thoracic MRI.  No abnormal enhancement. Electronically Signed   By: Mitzi Hansen M.D.   On: 03/13/2016 18:08   Mr Lumbar Spine W Wo Contrast  Result Date: 03/13/2016 CLINICAL DATA:  61 y/o F; sudden onset left-sided lower back pain. Concern for epidural abscess. EXAM: MRI THORACIC AND LUMBAR SPINE WITHOUT AND WITH CONTRAST TECHNIQUE: Multiplanar and multiecho pulse sequences of the thoracic and lumbar spine were obtained without and with intravenous contrast. CONTRAST:  4mL MULTIHANCE GADOBENATE DIMEGLUMINE 529 MG/ML IV SOLN COMPARISON:  CT chest, abdomen, and pelvis dated 03/11/2016. FINDINGS: MRI THORACIC SPINE FINDINGS Alignment:  Physiologic. Vertebrae: No fracture, evidence of discitis, or bone lesion. T8 inferior endplate nonenhancing Schmorl's node. Cord:  Normal signal and morphology. Paraspinal and other soft tissues: Negative. Disc levels: No significant disc displacement, foraminal narrowing, or canal stenosis. MRI LUMBAR SPINE FINDINGS Segmentation:  Standard. Alignment:  Physiologic. Vertebrae: There are  enhancing central deformity within the an L2 superior endplate, L3 inferior end plate, at L4 superior endplate with edema and enhancement. At L2-3 there is central disc enhancement and edema in adjacent L2 and L3 endplates with enhancement. Conus medullaris: Extends to the L1-2 level and appears normal. Paraspinal and other soft tissues: No paravertebral inflammatory changes or enhancement. Disc levels: L1-2: Small disc bulge with mild foraminal narrowing. No significant canal stenosis. L2-3: Small disc bulge with mild foraminal and lateral recess narrowing. No significant canal  stenosis. L3-4: Small disc bulge with mild foraminal and lateral recess narrowing. No significant canal stenosis. L4-5: Small disc bulge and mild bilateral facet hypertrophy greater on the right. Mild foraminal and lateral recess narrowing. L5-S1: Small central protrusion and moderate right-greater-than-left facet hypertrophy. No significant foraminal narrowing and canal stenosis. No significant canal stenosis. 7 x 9 mm synovial cyst in right paraspinal muscles. IMPRESSION: 1. Enhancing central and deformity within the L2 superior endplate, L3 inferior endplate, at L4 superior endplate with edema and enhancement. At L2-3 there is central disc enhancement extending into the L2 inferior endplate central deformity and enhanced throughout the adjacent L2 and L3 endplates. On prior CT these lesions have the appearance of Schmorl's nodes. Enhancement within the L2-3 intervertebral disc could represent infection but there is no significant disc edema or surrounding soft tissue edema and infection is considered less likely. 2. Mild lumbar spondylosis with multiple levels of mild foraminal narrowing. 3. Normal thoracic MRI.  No abnormal enhancement. Electronically Signed   By: Kristine Garbe M.D.   On: 03/13/2016 18:08   Ir Epidurography  Result Date: 03/15/2016 CLINICAL DATA:  Spondylosis without myelopathy. Severe back and leg pain left worse than right. EXAM: LUMBAR EPIDURAL INJECTION DIAGNOSTIC EPIDURAL INJECTION THERAPEUTIC EPIDURAL INJECTION COMPARISON:  MRI 03/13/2016 K PROCEDURE: The procedure, risks, benefits, and alternatives were explained to the patient. Questions regarding the procedure were encouraged and answered. The patient understands and consents to the procedure. LUMBAR EPIDURAL INJECTION An interlaminar approach was performed on the left at L3-4. The overlying skin was cleansed with Betadine, draped in sterile fashion, and anesthetized using 1% Lidocaine. A 20 gauge spinal needle was  advanced using loss-of-resistance technique. DIAGNOSTIC EPIDURAL INJECTION Injection of Isovue 200 shows a good epidural pattern with spread above and below the level of needle placement, primarily on the left. No vascular opacification is seen. Pattern of spread seems normal. THERAPEUTIC EPIDURAL INJECTION One hundred twentymg of Depo-Medrol mixed with 3 cc 1% lidocaine were instilled. The procedure was well-tolerated, and the patient returned to the floor in good condition FLUOROSCOPY TIME:  0 minutes 24 seconds. 50.48 micro gray meter squared IMPRESSION: Technically successful initial epidural injection on the left at L3-4. Electronically Signed   By: Nelson Chimes M.D.   On: 03/15/2016 16:24   Ct L-spine No Charge  Result Date: 03/13/2016 CLINICAL DATA:  Neck pain, LEFT flank pain since Sunday EXAM: CT LUMBAR SPINE WITHOUT CONTRAST TECHNIQUE: Multidetector CT imaging of the lumbar spine was performed without intravenous contrast administration. Multiplanar CT image reconstructions were also generated. COMPARISON:  CT abdomen/pelvis 03/10/2016 FINDINGS: Segmentation: Exam labeled with 5 non-rib-bearing lumbar vertebra. Alignment: Normal Vertebrae: Schmorl's nodes at the superior inferior endplates of L2 and L3 as well as the superior endplates of L4 and L5. Mild facet degenerative changes in lower lumbar spine. Probable discogenic sclerosis in the L2 and L3 vertebral bodies, minimally surrounding Schmorl's node at superior endplate L4 as well. Vertebral body heights maintained without fracture or bone  destruction. SI joints preserved. Paraspinal and other soft tissues: Atherosclerotic calcification aorta. Nonobstructing lower pole LEFT renal calculus at margin of exam please refer to CT abdomen report. No other soft tissue abnormalities. Disc levels: Disc space narrowing at L1-L2, L2-L3, less at remaining lumbar levels. Diffusely bulging discs at throughout the lumbar region from L1-L2 through L5-S1 without  definite focal disc herniation or neural compression. IMPRESSION: Degenerative disc and facet disease changes of the lumbar spine as above. Probable discogenic sclerosis at L2 and L3 vertebral bodies. No definite acute lumbar spine abnormalities. Nonobstructing LEFT renal calculus. Electronically Signed   By: Lavonia Dana M.D.   On: 03/13/2016 11:51   Ct Renal Stone Study  Result Date: 03/13/2016 CLINICAL DATA:  LEFT flank pain since Sunday, nausea, vomiting, hypertension EXAM: CT ABDOMEN AND PELVIS WITHOUT CONTRAST TECHNIQUE: Multidetector CT imaging of the abdomen and pelvis was performed following the standard protocol without IV contrast. Sagittal and coronal MPR images reconstructed from axial data set. Oral contrast not administered for this indication. COMPARISON:  03/10/2016 FINDINGS: Lower chest: Minimal dependent atelectasis Hepatobiliary: Fatty infiltration of liver. Gallbladder and liver otherwise unremarkable. Pancreas: Normal appearance Spleen: Normal appearance Adrenals/Urinary Tract: Thickened adrenal glands without discrete mass. Unremarkable RIGHT kidney, RIGHT ureter in urinary bladder. Nonobstructing LEFT renal calculi largest 6 mm diameter. No LEFT hydronephrosis, LEFT hydroureter or LEFT ureteral calcification. Stomach/Bowel: Normal appendix. Diverticulosis of descending and sigmoid colon without pericolic inflammatory changes to suggest acute diverticulitis. Small hiatal hernia. Stomach and small bowel loops otherwise unremarkable. Vascular/Lymphatic: Atherosclerotic calcifications aorta and iliac arteries. Aorta normal caliber. No adenopathy. Reproductive: Unremarkable uterus and adnexa Other: No free air free fluid.  No hernia or inflammatory process Musculoskeletal: Schmorl's nodes lumbar spine at L2-L5. Mild sclerosis of the L2 and L3 vertebral bodies, see dedicated CT lumbar spine exam. IMPRESSION: Nonobstructing LEFT renal calculi without evidence of hydronephrosis or hydroureter.  Hepatic steatosis. Distal colonic diverticulosis. Small hiatal hernia. Aortic atherosclerosis. Degenerative disc disease changes lumbar spine with mild sclerosis in the L2 and L3 vertebral bodies, please refer to dedicated CT lumbar spine report. Electronically Signed   By: Lavonia Dana M.D.   On: 03/13/2016 11:46   Ct Renal Stone Study  Result Date: 03/11/2016 CLINICAL DATA:  Left flank abdominal pain, onset at 14:00 EXAM: CT ABDOMEN AND PELVIS WITHOUT CONTRAST TECHNIQUE: Multidetector CT imaging of the abdomen and pelvis was performed following the standard protocol without IV contrast. COMPARISON:  None. FINDINGS: Lower chest: No acute abnormality. Hepatobiliary: Mild fatty infiltration of the liver without significant focal lesion. The gallbladder and bile ducts appear unremarkable. Pancreas: Unremarkable. No pancreatic ductal dilatation or surrounding inflammatory changes. Spleen: Normal in size without focal abnormality. Adrenals/Urinary Tract: Both adrenals are normal. There are collecting system calculi on the left, measuring up to 5 x 8 mm. No ureteral calculi. No hydronephrosis. Unremarkable urinary bladder. Stomach/Bowel: Small hiatal hernia. Extensive colonic diverticulosis. Normal appendix. Normal small bowel. No acute inflammation of bowel. No bowel obstruction or perforation. Vascular/Lymphatic: Aortic atherosclerosis. No enlarged abdominal or pelvic lymph nodes. Reproductive: Uterus and bilateral adnexa are unremarkable. Other: No acute inflammation. No ascites. Tiny fat containing umbilical hernia. Musculoskeletal: Several Schmorl's nodes. No significant skeletal lesion. Moderate lower lumbar facet arthritis. IMPRESSION: 1. Left nephrolithiasis, nonobstructing. 2. Hepatic steatosis. 3. Small hiatal hernia. 4. Colonic diverticulosis. 5. No acute inflammatory changes. Electronically Signed   By: Andreas Newport M.D.   On: 03/11/2016 00:18   Ct Angio Chest/abd/pel For Dissection W And/or Wo  Contrast  Result Date: 03/11/2016 CLINICAL DATA:  Left flank and abdominal pain. EXAM: CT ANGIOGRAPHY CHEST, ABDOMEN AND PELVIS TECHNIQUE: Multidetector CT imaging through the chest, abdomen and pelvis was performed using the standard protocol during bolus administration of intravenous contrast. Multiplanar reconstructed images and MIPs were obtained and reviewed to evaluate the vascular anatomy. CONTRAST:  100 cc Isovue 370 IV COMPARISON:  Noncontrast CT abdomen/pelvis 2 hours prior. FINDINGS: CTA CHEST FINDINGS Cardiovascular: No aortic dissection, aneurysm, hematoma or acute aortic syndrome. Mild atherosclerosis. Coronary artery calcifications are seen. Normal heart size. No pericardial effusion. Mediastinum/Nodes: No mediastinal or hilar adenopathy. No axillary adenopathy. Visualized thyroid gland is normal. Small hiatal hernia. Lungs/Pleura: No consolidation or pleural fluid. No pulmonary nodule. No evidence pulmonary edema. Musculoskeletal: There are no acute or suspicious osseous abnormalities. Review of the MIP images confirms the above findings. CTA ABDOMEN AND PELVIS FINDINGS VASCULAR Aorta: Normal in caliber with mild atherosclerosis. No dissection or aneurysm. Celiac: Patent without evidence of aneurysm, dissection, vasculitis or significant stenosis. SMA: Patent without evidence of aneurysm, dissection, vasculitis or significant stenosis. Renals: Both renal arteries are patent without evidence of aneurysm, dissection, vasculitis, fibromuscular dysplasia or significant stenosis. IMA: Patent without evidence of aneurysm, dissection, vasculitis or significant stenosis. Inflow: Atherosclerotic calcifications without dissection, significant stenosis or evidence of vasculitis. Veins: No obvious venous abnormality within the limitations of this arterial phase study. Review of the MIP images confirms the above findings. NON-VASCULAR Hepatobiliary: Hepatic steatosis is demonstrated on noncontrast exam.  Gallbladder physiologically distended, no calcified stone. No biliary dilatation. Pancreas: No ductal dilatation or inflammation. Spleen: Normal arterial phase enhancement. Adrenals/Urinary Tract: No adrenal nodule. Nonobstructing left nephrolithiasis. No hydronephrosis or perinephric edema. Urinary bladder is physiologically distended without wall thickening. Stomach/Bowel: Colonic diverticulosis without acute inflammation. No evidence of bowel inflammation. Lymphatic: No adenopathy. Reproductive: Uterus and bilateral adnexa are unremarkable. Other: No free air, free fluid, or intra-abdominal fluid collection. Tiny fat containing umbilical hernia. Musculoskeletal: Stable from prior.  Multiple Schmorl's nodes. Review of the MIP images confirms the above findings. IMPRESSION: 1. No aortic dissection or acute aortic abnormality. Mild thoracoabdominal atherosclerosis. 2. No acute abnormality in the chest, abdomen, or pelvis. Electronically Signed   By: Jeb Levering M.D.   On: 03/11/2016 01:51    Subjective: Tolerated bites of solid foods yesterday  Discharge Exam: Vitals:   03/22/16 0813 03/22/16 1005  BP: (!) 142/84 (!) 141/71  Pulse: 80   Resp: 16   Temp: 98.4 F (36.9 C)    Vitals:   03/21/16 2053 03/22/16 0545 03/22/16 0813 03/22/16 1005  BP: 101/68 (!) 154/89 (!) 142/84 (!) 141/71  Pulse: 60 67 80   Resp: _0 Temp: 99 F (37.2 C) 98.7 F (37.1 C) 98.4 F (36.9 C)   TempSrc: Oral Oral Oral   SpO2: 96% 100%    Weight:      Height:       General exam:  Adult female.  No acute distress.  HEENT:  NCAT, MMM Respiratory system: Clear to auscultation bilaterally Cardiovascular system: Regular rate and rhythm, normal S1/S2. No murmurs, rubs, gallops or clicks.  Warm extremities Gastrointestinal system: Normal active bowel sounds, soft, nondistended, nontender. MSK:  Normal tone and bulk, no lower extremity edema   The results of significant diagnostics from this  hospitalization (including imaging, microbiology, ancillary and laboratory) are listed below for reference.     Microbiology: Recent Results (from the past 240 hour(s))  Blood culture (routine x 2)  Status: None   Collection Time: 03/13/16  9:50 PM  Result Value Ref Range Status   Specimen Description BLOOD LEFT WRIST  Final   Special Requests BOTTLES DRAWN AEROBIC AND ANAEROBIC 5ML  Final   Culture   Final    NO GROWTH 5 DAYS Performed at [facility], [place] [address] [place], [place] [address]    Report Status 03/18/2016 FINAL  Final  Blood culture (routine x 2)     Status: None   Collection Time: 03/13/16 10:15 PM  Result Value Ref Range Status   Specimen Description BLOOD RIGHT FOREARM  Final   Special Requests BOTTLES DRAWN AEROBIC AND ANAEROBIC 5ML  Final   Culture   Final    NO GROWTH 5 DAYS Performed at [facility] Lab, [facility] [address] [place], [place] [address]    Report Status 03/18/2016 FINAL  Final     Labs: BNP (last 3 results) No results for input(s): BNP in the last 8760 hours. Basic Metabolic Panel:  Recent Labs Lab 03/16/16 0725 03/17/16 1042 03/18/16 0517 03/19/16 0612 03/21/16 0541 03/22/16 0526  NA 136 135 135 136 135 134*  K 3.8 3.5 3.3* 3.6 3.6 3.5  CL 103 98* 96* 100* 101 101  CO2 _0 GLUCOSE 108* 120* 104* 103* 97 109*  BUN _1 CREATININE 0.62 0.71 0.84 0.94 0.79 0.86  CALCIUM 8.6* 9.0 9.3 9.2 9.1 9.0  PHOS 2.1*  --  4.8* 3.8  --   --    Liver Function Tests:  Recent Labs Lab 03/18/16 1538  AST 29  ALT 48  ALKPHOS 51  BILITOT 1.2  PROT 7.0  ALBUMIN 4.4    Recent Labs Lab 03/18/16 1538  LIPASE 63*   No results for input(s): AMMONIA in the last 168 hours. CBC:  Recent Labs Lab 03/16/16 0725 03/18/16 0517  WBC 7.2 9.7  HGB 13.2 15.5*  HCT 40.5 45.9  MCV 90.2 87.9  PLT 177 232   Cardiac Enzymes:  Recent Labs Lab 03/16/16 1006 03/16/16 1646 03/16/16 2143  TROPONINI 0.05*  0.04* 0.03*   BNP: Invalid input(s): POCBNP CBG: No results for input(s): GLUCAP in the last 168 hours. D-Dimer No results for input(s): DDIMER in the last 72 hours. Hgb A1c No results for input(s): HGBA1C in the last 72 hours. Lipid Profile No results for input(s): CHOL, HDL, LDLCALC, TRIG, CHOLHDL, LDLDIRECT in the last 72 hours. Thyroid function studies No results for input(s): TSH, T4TOTAL, T3FREE, THYROIDAB in the last 72 hours.  Invalid input(s): FREET3 Anemia work up No results for input(s): VITAMINB12, FOLATE, FERRITIN, TIBC, IRON, RETICCTPCT in the last 72 hours. Urinalysis    Component Value Date/Time   COLORURINE YELLOW 03/13/2016 0929   APPEARANCEUR CLOUDY (A) 03/13/2016 0929   LABSPEC 1.014 03/13/2016 0929   PHURINE 7.0 03/13/2016 0929   GLUCOSEU NEGATIVE 03/13/2016 0929   HGBUR SMALL (A) 03/13/2016 0929   BILIRUBINUR NEGATIVE 03/13/2016 0929   BILIRUBINUR n 07/19/2013 1200   KETONESUR 80 (A) 03/13/2016 0929   PROTEINUR 30 (A) 03/13/2016 0929   UROBILINOGEN 0.2 07/19/2013 1200   UROBILINOGEN 0.2 12/31/2011 1629   NITRITE NEGATIVE 03/13/2016 0929   LEUKOCYTESUR SMALL (A) 03/13/2016 0929   Sepsis Labs Invalid input(s): PROCALCITONIN,  WBC,  LACTICIDVEN   Time coordinating discharge: Over 30 minutes  SIGNED:   Janece Canterbury, MD  [facility] 03/22/2016, 11:49 AM Pager   If 7PM-7AM, please contact  night-coverage [email or web address] Password [email or web address]

## 2016-03-22 NOTE — Progress Notes (Signed)
Pt tolerating solid food (soft diet) and feels well and ready to go home.  OK w/ dischg today from GI standpoint.  No GI follow-up needed.    BTW, she is getting colon cancer screening through her PCP via Cologuard testing.  I suggested pt avoid vegetables for the time being, and then add them back into her diet gradually as she continues to improve.  I do NOT think this pt has a chronic gastric motility disorder--I think this is a "one and done" event that was precipitated by her lumbar pain, pain meds, etc.  I will sign off; please call us if questions or if we can be of further help.   Florencia Reasonsobert V. Khari Lett, M.D. Pager [phone] If no answer or after 5 PM call [phone]

## 2017-05-23 ENCOUNTER — Other Ambulatory Visit: Payer: Self-pay | Admitting: Internal Medicine

## 2017-05-23 DIAGNOSIS — Z1231 Encounter for screening mammogram for malignant neoplasm of breast: Secondary | ICD-10-CM

## 2017-05-26 ENCOUNTER — Ambulatory Visit
Admission: RE | Admit: 2017-05-26 | Discharge: 2017-05-26 | Disposition: A | Discharge status: Home / Self Care | Payer: BLUE CROSS/BLUE SHIELD | Source: Ambulatory Visit | Attending: Internal Medicine | Admitting: Internal Medicine

## 2017-05-26 DIAGNOSIS — Z1231 Encounter for screening mammogram for malignant neoplasm of breast: Secondary | ICD-10-CM

## 2017-12-08 ENCOUNTER — Encounter: Payer: Self-pay | Admitting: Internal Medicine

## 2017-12-15 ENCOUNTER — Emergency Department (HOSPITAL_BASED_OUTPATIENT_CLINIC_OR_DEPARTMENT_OTHER)
Admission: EM | Admit: 2017-12-15 | Discharge: 2017-12-15 | Disposition: A | Discharge status: Home / Self Care | Payer: BLUE CROSS/BLUE SHIELD | Attending: Emergency Medicine | Admitting: Emergency Medicine

## 2017-12-15 ENCOUNTER — Emergency Department (HOSPITAL_BASED_OUTPATIENT_CLINIC_OR_DEPARTMENT_OTHER): Payer: BLUE CROSS/BLUE SHIELD

## 2017-12-15 DIAGNOSIS — N3001 Acute cystitis with hematuria: Secondary | ICD-10-CM | POA: Insufficient documentation

## 2017-12-15 DIAGNOSIS — Z87891 Personal history of nicotine dependence: Secondary | ICD-10-CM | POA: Diagnosis not present

## 2017-12-15 DIAGNOSIS — N23 Unspecified renal colic: Secondary | ICD-10-CM | POA: Insufficient documentation

## 2017-12-15 DIAGNOSIS — I10 Essential (primary) hypertension: Secondary | ICD-10-CM | POA: Insufficient documentation

## 2017-12-15 DIAGNOSIS — Z7982 Long term (current) use of aspirin: Secondary | ICD-10-CM | POA: Insufficient documentation

## 2017-12-15 DIAGNOSIS — R1032 Left lower quadrant pain: Secondary | ICD-10-CM | POA: Diagnosis present

## 2017-12-15 DIAGNOSIS — Z79899 Other long term (current) drug therapy: Secondary | ICD-10-CM | POA: Insufficient documentation

## 2017-12-15 LAB — URINALYSIS, ROUTINE W REFLEX MICROSCOPIC
Glucose, UA: NEGATIVE mg/dL
Ketones, ur: 15 mg/dL — AB
Nitrite: NEGATIVE
Protein, ur: 100 mg/dL — AB
Specific Gravity, Urine: >1.03 — ABNORMAL HIGH (ref 1.005–1.030)
pH: 5.5 (ref 5.0–8.0)

## 2017-12-15 LAB — CBC
HCT: 45.1 % (ref 36.0–46.0)
Hemoglobin: 14.3 g/dL (ref 12.0–15.0)
MCH: 30.8 pg (ref 26.0–34.0)
MCHC: 31.7 g/dL (ref 30.0–36.0)
MCV: 97.2 fL (ref 80.0–100.0)
Platelets: 200 10*3/uL (ref 150–400)
RBC: 4.64 MIL/uL (ref 3.87–5.11)
RDW: 12.1 % (ref 11.5–15.5)
WBC: 7.4 10*3/uL (ref 4.0–10.5)
nRBC: 0 % (ref 0.0–0.2)

## 2017-12-15 LAB — COMPREHENSIVE METABOLIC PANEL
ALT: 35 U/L (ref 0–44)
AST: 35 U/L (ref 15–41)
Albumin: 3.9 g/dL (ref 3.5–5.0)
Alkaline Phosphatase: 68 U/L (ref 38–126)
Anion gap: 11 (ref 5–15)
BUN: 11 mg/dL (ref 8–23)
CO2: 25 mmol/L (ref 22–32)
Calcium: 9 mg/dL (ref 8.9–10.3)
Chloride: 104 mmol/L (ref 98–111)
Creatinine, Ser: 0.91 mg/dL (ref 0.44–1.00)
GFR calc Af Amer: >60 mL/min (ref >60)
GFR calc non Af Amer: >60 mL/min (ref >60)
Glucose, Bld: 158 mg/dL — ABNORMAL HIGH (ref 70–99)
Potassium: 3.2 mmol/L — ABNORMAL LOW (ref 3.5–5.1)
Sodium: 140 mmol/L (ref 135–145)
Total Bilirubin: 0.5 mg/dL (ref 0.3–1.2)
Total Protein: 6.2 g/dL — ABNORMAL LOW (ref 6.5–8.1)

## 2017-12-15 LAB — LIPASE, BLOOD: Lipase: 30 U/L (ref 11–51)

## 2017-12-15 LAB — URINALYSIS, MICROSCOPIC (REFLEX): RBC / HPF: >50 RBC/hpf (ref 0–5)

## 2017-12-15 MED ORDER — FENTANYL CITRATE (PF) 100 MCG/2ML IJ SOLN
50.0000 ug | INTRAMUSCULAR | Status: AC | PRN
  Administered 2017-12-15 (×2): 50 ug via INTRAVENOUS
  Filled 2017-12-15 (×2): qty 2

## 2017-12-15 MED ORDER — HYDROCODONE-ACETAMINOPHEN 5-325 MG PO TABS: 0.5000 | ORAL_TABLET | Freq: Three times a day (TID) | ORAL | 0 refills | Status: DC | PRN

## 2017-12-15 MED ORDER — ONDANSETRON HCL 4 MG/2ML IJ SOLN
4.0000 mg | Freq: Once | INTRAMUSCULAR | Status: AC | PRN
  Administered 2017-12-15: 4 mg via INTRAVENOUS
  Filled 2017-12-15 (×2): qty 2

## 2017-12-15 MED ORDER — KETOROLAC TROMETHAMINE 15 MG/ML IJ SOLN
INTRAMUSCULAR | Status: AC
  Filled 2017-12-15: qty 1

## 2017-12-15 MED ORDER — PROMETHAZINE HCL 25 MG RE SUPP: 25.0000 mg | Freq: Four times a day (QID) | RECTAL | 0 refills | Status: DC | PRN

## 2017-12-15 MED ORDER — SODIUM CHLORIDE 0.9 % IV BOLUS
500.0000 mL | Freq: Once | INTRAVENOUS | Status: AC
  Administered 2017-12-15: 500 mL via INTRAVENOUS

## 2017-12-15 MED ORDER — SODIUM CHLORIDE 0.9 % IV SOLN
1.0000 g | Freq: Once | INTRAVENOUS | Status: AC
  Administered 2017-12-15: 1 g via INTRAVENOUS
  Filled 2017-12-15: qty 10

## 2017-12-15 MED ORDER — ONDANSETRON 4 MG PO TBDP: 4.0000 mg | ORAL_TABLET | Freq: Three times a day (TID) | ORAL | 0 refills | Status: AC | PRN

## 2017-12-15 MED ORDER — KETOROLAC TROMETHAMINE 15 MG/ML IJ SOLN
7.5000 mg | Freq: Once | INTRAMUSCULAR | Status: AC
  Administered 2017-12-15: 7.5 mg via INTRAVENOUS

## 2017-12-15 MED ORDER — ONDANSETRON HCL 4 MG/2ML IJ SOLN
4.0000 mg | Freq: Once | INTRAMUSCULAR | Status: AC | PRN
  Administered 2017-12-15: 4 mg via INTRAVENOUS
  Filled 2017-12-15: qty 2

## 2017-12-15 MED ORDER — SODIUM CHLORIDE 0.9 % IV SOLN
INTRAVENOUS | Status: DC | PRN
  Administered 2017-12-15: 500 mL via INTRAVENOUS

## 2017-12-15 MED ORDER — IBUPROFEN 600 MG PO TABS: 600.0000 mg | ORAL_TABLET | Freq: Four times a day (QID) | ORAL | 0 refills | Status: DC | PRN

## 2017-12-15 MED ORDER — PROMETHAZINE HCL 25 MG/ML IJ SOLN
25.0000 mg | Freq: Once | INTRAMUSCULAR | Status: AC
  Administered 2017-12-15: 25 mg via INTRAVENOUS
  Filled 2017-12-15: qty 1

## 2017-12-15 MED ORDER — CEPHALEXIN 500 MG PO CAPS: 500.0000 mg | ORAL_CAPSULE | Freq: Three times a day (TID) | ORAL | 0 refills | Status: DC

## 2017-12-15 MED ORDER — KETOROLAC TROMETHAMINE 15 MG/ML IJ SOLN
15.0000 mg | Freq: Once | INTRAMUSCULAR | Status: AC
  Administered 2017-12-15: 15 mg via INTRAVENOUS
  Filled 2017-12-15: qty 1

## 2017-12-15 MED FILL — HYDROCODON-APAP 5-325: 5-325 | 5 days supply | Qty: 15 | Fill #0

## 2017-12-15 MED FILL — CEPHALEXIN 500 MG CAPSULE: 500 | 7 days supply | Qty: 21 | Fill #0

## 2017-12-15 MED FILL — IBUPROFEN 600 MG TABLET: 600 | 8 days supply | Qty: 30 | Fill #0

## 2017-12-15 MED FILL — ONDANSETRON ODT 4 MG TABLET: 4 | 5 days supply | Qty: 15 | Fill #0

## 2017-12-15 NOTE — ED Provider Notes (Signed)
  Physical Exam  BP (!) 184/93   Pulse 71   Temp 97.6 F (36.4 C) (Oral)   Resp 20   Ht 5\' 3"  (1.6 m)   Wt 90.7 kg   SpO2 97%   BMI 35.43 kg/m   Physical Exam  ED Course/Procedures     Procedures  MDM  Assuming care of patient from Dr. Eudelia Bunch   Patient in the ED for abdominal pain and urinary discomfort. Workup thus far shows renal stone and a UTI.  Concerning findings are as following none Important pending results are none  According to Dr. Eudelia Bunch, plan is to get patient's symptoms of pain and nausea and better control, and once that is accomplished patient will be discharged.  9:40 AM Reassessment 2 3 completed.  Initially patient was having pain and was receiving fentanyl.  On reassessment she was nauseated after the fentanyl.  Patient was given promethazine because Zofran did not help, and now her nausea is improved and her pain continues to be tolerable.  Strict ER return precautions discussed.  Patient will be discharged with appropriate medications and follow-up instructions.   Derwood Kaplan, MD 12/15/17 [phone]

## 2017-12-15 NOTE — ED Triage Notes (Signed)
Pt states at around 2300 she started feeling nauseous and diarrhea. Shortly thereafter she started having left sided abdominal pain and was unable to urinate. Pt is uncomfortable during triage

## 2017-12-15 NOTE — ED Provider Notes (Signed)
[facility] EMERGENCY DEPARTMENT Provider Note  CSN: [id number] Arrival date & time: 12/15/17 5621  Chief Complaint(s) Abdominal Pain  HPI Sheryl Bates is a 62 y.o. female past medical history listed below who presents to the emergency department with difficulty voiding and left lower quadrant pain that began approximately 6 hours prior to arrival.  She reports that the pain was gradual onset and fluctuating in intensity.  No alleviating factors or aggravating factors.  She is endorsing nausea with nonbloody nonbilious emesis.  Denies any diarrhea.  No chest pain or shortness of breath.  No recent fevers or infections.  Denies any dysuria, but endorses urgency.  HPI  Past Medical History Past Medical History:  Diagnosis Date  . Allergy   . CIN I (cervical intraepithelial neoplasia I)   . Endometriosis   . Hyperlipidemia   . Hypertension   . Kidney stone   . Ovarian cyst    right  . Vitamin D deficiency    Patient Active Problem List   Diagnosis Date Noted  . Hypokalemia 03/13/2016  . Dehydration 03/13/2016  . Nausea & vomiting 03/13/2016  . Back pain 03/13/2016  . Kidney stone   . Hypertension   . Endometriosis   . CIN I (cervical intraepithelial neoplasia I)   . Ovarian cyst   . Vitamin D deficiency   . Hyperlipidemia    Home Medication(s) Prior to Admission medications   Medication Sig Start Date End Date Taking? Authorizing Provider  amLODipine (NORVASC) 10 MG tablet Take 1 tablet (10 mg total) by mouth daily. 03/23/16   Renae Fickle, MD  aspirin EC 81 MG tablet Take 81 mg by mouth daily.    [provider]  cephALEXin (KEFLEX) 500 MG capsule Take 1 capsule (500 mg total) by mouth 3 (three) times daily for 7 days. 12/15/17 12/22/17  Nira Conn, MD  Cholecalciferol (VITAMIN D PO) Take 2 capsules by mouth daily with breakfast.    [provider]  citalopram (CELEXA) 20 MG tablet Take 1 tablet (20 mg total) by mouth  daily. 11/24/13   [name], MD  gabapentin (NEURONTIN) 100 MG capsule Take 1 capsule (100 mg total) by mouth at bedtime. 03/22/16   Renae Fickle, MD  hydrALAZINE (APRESOLINE) 25 MG tablet Take 1 tablet (25 mg total) by mouth every 8 (eight) hours. 03/22/16   Renae Fickle, MD  hydrochlorothiazide (HYDRODIURIL) 25 MG tablet TAKE ONE TABLET BY MOUTH ONCE DAILY 08/10/14   Swords, Valetta Mole, MD  HYDROcodone-acetaminophen (NORCO/VICODIN) 5-325 MG tablet Take 0.5-1 tablets by mouth every 8 (eight) hours as needed for up to 5 days for severe pain (That is not improved by your scheduled motrin regimen). Please do not exceed 4000 mg of acetaminophen (Tylenol) a 24-hour period. Please note that he may be prescribed additional medicine that contains acetaminophen. 12/15/17 12/20/17  Nira Conn, MD  ibuprofen (ADVIL,MOTRIN) 600 MG tablet Take 1 tablet (600 mg total) by mouth every 6 (six) hours as needed. 12/15/17   Nira Conn, MD  losartan (COZAAR) 100 MG tablet Take 1 tablet (100 mg total) by mouth daily. TAKE ONE TABLET BY MOUTH ONE TIME DAILY Patient taking differently: Take 100 mg by mouth daily.  07/19/13   Swords, Valetta Mole, MD  metoprolol tartrate (LOPRESSOR) 50 MG tablet Take 1 tablet (50 mg total) by mouth 2 (two) times daily. Patient taking differently: Take 50 mg by mouth daily with breakfast.  07/19/13   Swords, Valetta Mole, MD  omeprazole (  PRILOSEC) 40 MG capsule Take 1 capsule (40 mg total) by mouth daily. 03/22/16   Renae Fickle, MD  ondansetron (ZOFRAN ODT) 4 MG disintegrating tablet Take 1 tablet (4 mg total) by mouth every 8 (eight) hours as needed for nausea or vomiting. 03/22/16   Renae Fickle, MD                                                                                                                                    Past Surgical History Past Surgical History:  Procedure Laterality Date  . CERVICAL BIOPSY  W/ LOOP ELECTRODE EXCISION    . COLPOSCOPY    .  ESOPHAGOGASTRODUODENOSCOPY N/A 03/18/2016   Procedure: ESOPHAGOGASTRODUODENOSCOPY (EGD);  Surgeon: Bernette Redbird, MD;  Location: [facility];  Service: Endoscopy;  Laterality: N/A;  . IR GENERIC HISTORICAL  03/15/2016   IR EPIDUROGRAPHY 03/15/2016 [facility]  . KNEE SURGERY     Right  . LAPAROSCOPY  1996  . LEEP  1992  . PELVIC LAPAROSCOPY     Family History Family History  Problem Relation Age of Onset  . Hypertension Mother   . Heart disease Mother   . Diabetes Mother   . Heart disease Father 45       CHF    Social History Social History   Tobacco Use  . Smoking status: Former Smoker    Last attempt to quit: 05/03/2005    Years since quitting: 12.6  . Smokeless tobacco: Never Used  Substance Use Topics  . Alcohol use: No    Comment: rare  . Drug use: No   Allergies Sulfonamide derivatives and Lisinopril  Review of Systems Review of Systems All other systems are reviewed and are negative for acute change except as noted in the HPI  Physical Exam Vital Signs  I have reviewed the triage vital signs BP (!) 168/69   Pulse 62   Temp 97.6 F (36.4 C) (Oral)   Resp 20   Ht 5\' 3"  (1.6 m)   Wt 90.7 kg   SpO2 100%   BMI 35.43 kg/m   Physical Exam  Constitutional: She is oriented to person, place, and time. She appears well-developed and well-nourished. No distress.  HENT:  Head: Normocephalic and atraumatic.  Right Ear: External ear normal.  Left Ear: External ear normal.  Nose: Nose normal.  Eyes: Conjunctivae and EOM are normal. No scleral icterus.  Neck: Normal range of motion and phonation normal.  Cardiovascular: Normal rate and regular rhythm.  Pulmonary/Chest: Effort normal. No stridor. No respiratory distress.  Abdominal: She exhibits no distension. There is no tenderness. There is no rigidity, no rebound, no guarding and no CVA tenderness. No hernia.  Musculoskeletal: Normal range of motion. She exhibits no edema.  Neurological: She is alert and  oriented to person, place, and time.  Skin: She is not diaphoretic.  Psychiatric: She has a normal mood and affect. Her behavior is normal.  Vitals reviewed.   ED Results and Treatments Labs (all labs ordered are listed, but only abnormal results are displayed) Labs Reviewed  URINALYSIS, ROUTINE W REFLEX MICROSCOPIC - Abnormal; Notable for the following components:      Result Value   Color, Urine BROWN (*)    APPearance CLOUDY (*)    Specific Gravity, Urine >1.030 (*)    Hgb urine dipstick LARGE (*)    Bilirubin Urine SMALL (*)    Ketones, ur 15 (*)    Protein, ur 100 (*)    Leukocytes, UA TRACE (*)    All other components within normal limits  COMPREHENSIVE METABOLIC PANEL - Abnormal; Notable for the following components:   Potassium 3.2 (*)    Glucose, Bld 158 (*)    Total Protein 6.2 (*)    All other components within normal limits  URINALYSIS, MICROSCOPIC (REFLEX) - Abnormal; Notable for the following components:   Bacteria, UA MANY (*)    All other components within normal limits  URINE CULTURE  CBC  LIPASE, BLOOD                                                                                                                         EKG  EKG Interpretation  Date/Time:    Ventricular Rate:    PR Interval:    QRS Duration:   QT Interval:    QTC Calculation:   R Axis:     Text Interpretation:        Radiology Ct Renal Stone Study  Result Date: 12/15/2017 CLINICAL DATA:  Nausea and diarrhea. Left-sided abdominal pain. Unable to urinate. EXAM: CT ABDOMEN AND PELVIS WITHOUT CONTRAST TECHNIQUE: Multidetector CT imaging of the abdomen and pelvis was performed following the standard protocol without IV contrast. COMPARISON:  03/13/2016 FINDINGS: Lower chest: Mild dependent changes in the lung bases. Small esophageal hiatal hernia. Hepatobiliary: Mild diffuse fatty infiltration of the liver, improved since previous study. Gallbladder and bile ducts are unremarkable.  Pancreas: Unremarkable. No pancreatic ductal dilatation or surrounding inflammatory changes. Spleen: Normal in size without focal abnormality. Adrenals/Urinary Tract: No adrenal gland nodules. Stone in the distal left ureter at the ureterovesical junction measuring 4 mm diameter. Proximal hydronephrosis and hydroureter with stranding around the left kidney. Additional nonobstructing left intrarenal stone measuring 4 mm diameter. Right kidney and ureter are unremarkable. Bladder is decompressed. Stomach/Bowel: Diverticulosis of the sigmoid colon without evidence of diverticulitis. No evidence of obstruction or inflammation. Appendix is normal. Vascular/Lymphatic: Aortic atherosclerosis. No enlarged abdominal or pelvic lymph nodes. Reproductive: Uterus and bilateral adnexa are unremarkable. Other: No abdominal wall hernia or abnormality. No abdominopelvic ascites. Musculoskeletal: Degenerative changes in the spine. Multiple Schmorl's nodes. IMPRESSION: 4 mm stone in the distal left ureter with moderate proximal obstruction. Additional nonobstructing stone in the left kidney. Mild diffuse fatty infiltration of the liver, improved since previous study. Small esophageal hiatal hernia. Electronically Signed   By: Burman Nieves M.D.   On: 12/15/2017 06:46  Pertinent labs & imaging results that were available during my care of the patient were reviewed by me and considered in my medical decision making (see chart for details).  Medications Ordered in ED Medications  ondansetron (ZOFRAN) injection 4 mg (has no administration in time range)  sodium chloride 0.9 % bolus 500 mL (500 mLs Intravenous New Bag/Given 12/15/17 0713)  0.9 %  sodium chloride infusion (500 mLs Intravenous New Bag/Given 12/15/17 0641)  ondansetron (ZOFRAN) injection 4 mg (4 mg Intravenous Given 12/15/17 0309)  fentaNYL (SUBLIMAZE) injection 50 mcg (50 mcg Intravenous Given 12/15/17 0358)  sodium chloride 0.9 % bolus 500 mL (500 mLs Intravenous  New Bag/Given 12/15/17 0400)  ketorolac (TORADOL) 15 MG/ML injection 15 mg (15 mg Intravenous Given 12/15/17 0403)  cefTRIAXone (ROCEPHIN) 1 g in sodium chloride 0.9 % 100 mL IVPB (1 g Intravenous New Bag/Given 12/15/17 0642)  ketorolac (TORADOL) 15 MG/ML injection 7.5 mg (7.5 mg Intravenous Given 12/15/17 1610)                                                                                                                                    Procedures Procedures  (including critical care time)  Medical Decision Making / ED Course I have reviewed the nursing notes for this encounter and the patient's prior records (if available in EHR or on provided paperwork).    Left lower quadrant abdominal pain.  Abdomen benign.  Patient with recent imaging notable for left renal stones.  Believe that this is the etiology of patient's pain.  Labs without leukocytosis or anemia.  No significant electrolyte derangements or renal insufficiency.  No evidence of biliary obstruction or pancreatitis.  Urinalysis with hematuria, cloudy with trace leukocytes and many bacteria.  Suspicious for possible infection.  Will treat as such.  Given IV Rocephin in the emergency department.  She was also treated symptomatically with IV Toradol, fentanyl and Zofran.  Patient did have some improvement in the pain but recurred.  Given additional Toradol.  CT obtain revealing 4 mm left ureter stone with moderate hydronephrosis.  Awaiting completion of Abx and pain control.  Patient care turned over to Dr Rhunette Croft at [facility]. Patient case and results discussed in detail; please see their note for further ED managment.      Final Clinical Impression(s) / ED Diagnoses Final diagnoses:  Renal colic on left side  Acute cystitis with hematuria      This chart was dictated using voice recognition software.  Despite best efforts to proofread,  errors can occur which can change the documentation meaning.   Nira Conn, MD 12/15/17 [phone]

## 2017-12-15 NOTE — Discharge Instructions (Signed)
We saw you in the ER for the abdominal pain. Our results indicate that you have a kidney stone AND possibly a urinary infection. We were able to get your pain is relative control, and we can safely send you home.  Take the meds prescribed. Set up an appointment with the Urologist. If the pain is unbearable, you start having fevers, chills, and are unable to keep any meds down - then return to the ER.

## 2017-12-16 LAB — URINE CULTURE: Culture: NO GROWTH

## 2017-12-20 ENCOUNTER — Inpatient Hospital Stay (HOSPITAL_BASED_OUTPATIENT_CLINIC_OR_DEPARTMENT_OTHER)
Admission: EM | Admit: 2017-12-20 | Discharge: 2017-12-26 | DRG: 660 | Disposition: A | Discharge status: Home / Self Care | Payer: BLUE CROSS/BLUE SHIELD | Attending: Urology | Admitting: Urology

## 2017-12-20 ENCOUNTER — Encounter (HOSPITAL_BASED_OUTPATIENT_CLINIC_OR_DEPARTMENT_OTHER): Payer: Self-pay | Admitting: Emergency Medicine

## 2017-12-20 ENCOUNTER — Observation Stay (HOSPITAL_COMMUNITY): Payer: BLUE CROSS/BLUE SHIELD | Admitting: Certified Registered"

## 2017-12-20 ENCOUNTER — Observation Stay (HOSPITAL_COMMUNITY): Payer: BLUE CROSS/BLUE SHIELD

## 2017-12-20 ENCOUNTER — Other Ambulatory Visit: Payer: Self-pay

## 2017-12-20 ENCOUNTER — Emergency Department (HOSPITAL_BASED_OUTPATIENT_CLINIC_OR_DEPARTMENT_OTHER): Payer: BLUE CROSS/BLUE SHIELD

## 2017-12-20 ENCOUNTER — Encounter (HOSPITAL_COMMUNITY)
Admission: EM | Disposition: A | Discharge status: Home / Self Care | Payer: Self-pay | Source: Home / Self Care | Attending: Urology

## 2017-12-20 DIAGNOSIS — I160 Hypertensive urgency: Secondary | ICD-10-CM | POA: Diagnosis not present

## 2017-12-20 DIAGNOSIS — Z87891 Personal history of nicotine dependence: Secondary | ICD-10-CM | POA: Diagnosis not present

## 2017-12-20 DIAGNOSIS — Z833 Family history of diabetes mellitus: Secondary | ICD-10-CM | POA: Diagnosis not present

## 2017-12-20 DIAGNOSIS — Z7982 Long term (current) use of aspirin: Secondary | ICD-10-CM | POA: Diagnosis not present

## 2017-12-20 DIAGNOSIS — E785 Hyperlipidemia, unspecified: Secondary | ICD-10-CM | POA: Diagnosis present

## 2017-12-20 DIAGNOSIS — Z882 Allergy status to sulfonamides status: Secondary | ICD-10-CM

## 2017-12-20 DIAGNOSIS — E669 Obesity, unspecified: Secondary | ICD-10-CM | POA: Diagnosis present

## 2017-12-20 DIAGNOSIS — I444 Left anterior fascicular block: Secondary | ICD-10-CM | POA: Diagnosis not present

## 2017-12-20 DIAGNOSIS — Z87442 Personal history of urinary calculi: Secondary | ICD-10-CM

## 2017-12-20 DIAGNOSIS — N809 Endometriosis, unspecified: Secondary | ICD-10-CM | POA: Diagnosis not present

## 2017-12-20 DIAGNOSIS — I10 Essential (primary) hypertension: Secondary | ICD-10-CM | POA: Diagnosis not present

## 2017-12-20 DIAGNOSIS — Z888 Allergy status to other drugs, medicaments and biological substances status: Secondary | ICD-10-CM | POA: Diagnosis not present

## 2017-12-20 DIAGNOSIS — Z8249 Family history of ischemic heart disease and other diseases of the circulatory system: Secondary | ICD-10-CM

## 2017-12-20 DIAGNOSIS — E876 Hypokalemia: Secondary | ICD-10-CM | POA: Diagnosis not present

## 2017-12-20 DIAGNOSIS — I1A0 Resistant hypertension: Secondary | ICD-10-CM

## 2017-12-20 DIAGNOSIS — Z6833 Body mass index (BMI) 33.0-33.9, adult: Secondary | ICD-10-CM | POA: Diagnosis not present

## 2017-12-20 DIAGNOSIS — E871 Hypo-osmolality and hyponatremia: Secondary | ICD-10-CM | POA: Diagnosis not present

## 2017-12-20 DIAGNOSIS — N200 Calculus of kidney: Secondary | ICD-10-CM

## 2017-12-20 DIAGNOSIS — E559 Vitamin D deficiency, unspecified: Secondary | ICD-10-CM | POA: Diagnosis present

## 2017-12-20 DIAGNOSIS — N132 Hydronephrosis with renal and ureteral calculous obstruction: Secondary | ICD-10-CM | POA: Diagnosis not present

## 2017-12-20 DIAGNOSIS — R509 Fever, unspecified: Secondary | ICD-10-CM

## 2017-12-20 DIAGNOSIS — K5900 Constipation, unspecified: Secondary | ICD-10-CM

## 2017-12-20 DIAGNOSIS — N201 Calculus of ureter: Secondary | ICD-10-CM | POA: Diagnosis present

## 2017-12-20 DIAGNOSIS — R112 Nausea with vomiting, unspecified: Secondary | ICD-10-CM | POA: Diagnosis present

## 2017-12-20 DIAGNOSIS — R1110 Vomiting, unspecified: Secondary | ICD-10-CM

## 2017-12-20 DIAGNOSIS — R110 Nausea: Secondary | ICD-10-CM

## 2017-12-20 DIAGNOSIS — R109 Unspecified abdominal pain: Secondary | ICD-10-CM

## 2017-12-20 HISTORY — PX: CYSTOSCOPY/RETROGRADE/URETEROSCOPY/STONE EXTRACTION WITH BASKET: SHX5317

## 2017-12-20 LAB — CBC
HCT: 46.1 % — ABNORMAL HIGH (ref 36.0–46.0)
Hemoglobin: 15.5 g/dL — ABNORMAL HIGH (ref 12.0–15.0)
MCH: 31.1 pg (ref 26.0–34.0)
MCHC: 33.6 g/dL (ref 30.0–36.0)
MCV: 92.6 fL (ref 80.0–100.0)
Platelets: 180 10*3/uL (ref 150–400)
RBC: 4.98 MIL/uL (ref 3.87–5.11)
RDW: 11.8 % (ref 11.5–15.5)
WBC: 7.3 10*3/uL (ref 4.0–10.5)
nRBC: 0 % (ref 0.0–0.2)

## 2017-12-20 LAB — COMPREHENSIVE METABOLIC PANEL
ALT: 51 U/L — ABNORMAL HIGH (ref 0–44)
AST: 41 U/L (ref 15–41)
Albumin: 4.2 g/dL (ref 3.5–5.0)
Alkaline Phosphatase: 66 U/L (ref 38–126)
Anion gap: 11 (ref 5–15)
BUN: 13 mg/dL (ref 8–23)
CO2: 26 mmol/L (ref 22–32)
Calcium: 9.5 mg/dL (ref 8.9–10.3)
Chloride: 101 mmol/L (ref 98–111)
Creatinine, Ser: 0.86 mg/dL (ref 0.44–1.00)
GFR calc Af Amer: >60 mL/min (ref >60)
GFR calc non Af Amer: >60 mL/min (ref >60)
Glucose, Bld: 142 mg/dL — ABNORMAL HIGH (ref 70–99)
Potassium: 3.1 mmol/L — ABNORMAL LOW (ref 3.5–5.1)
Sodium: 138 mmol/L (ref 135–145)
Total Bilirubin: 1.4 mg/dL — ABNORMAL HIGH (ref 0.3–1.2)
Total Protein: 6.7 g/dL (ref 6.5–8.1)

## 2017-12-20 LAB — URINALYSIS, ROUTINE W REFLEX MICROSCOPIC
Bilirubin Urine: NEGATIVE
Glucose, UA: NEGATIVE mg/dL
Ketones, ur: 40 mg/dL — AB
Nitrite: NEGATIVE
Protein, ur: NEGATIVE mg/dL
Specific Gravity, Urine: 1.02 (ref 1.005–1.030)
pH: 7 (ref 5.0–8.0)

## 2017-12-20 LAB — URINALYSIS, MICROSCOPIC (REFLEX)

## 2017-12-20 LAB — SURGICAL PCR SCREEN
MRSA, PCR: NEGATIVE
Staphylococcus aureus: NEGATIVE

## 2017-12-20 LAB — LIPASE, BLOOD: Lipase: 32 U/L (ref 11–51)

## 2017-12-20 SURGERY — CYSTOSCOPY, WITH CALCULUS REMOVAL USING BASKET
Anesthesia: General | Site: Ureter | Laterality: Left

## 2017-12-20 MED ORDER — SUCCINYLCHOLINE CHLORIDE 200 MG/10ML IV SOSY
PREFILLED_SYRINGE | INTRAVENOUS | Status: DC | PRN
  Administered 2017-12-20: 120 mg via INTRAVENOUS

## 2017-12-20 MED ORDER — LIDOCAINE 2% (20 MG/ML) 5 ML SYRINGE
INTRAMUSCULAR | Status: AC
  Filled 2017-12-20: qty 5

## 2017-12-20 MED ORDER — INFLUENZA VAC SPLIT QUAD 0.5 ML IM SUSY: 0.5000 mL | PREFILLED_SYRINGE | INTRAMUSCULAR | Status: DC

## 2017-12-20 MED ORDER — ALBUMIN HUMAN 5 % IV SOLN
INTRAVENOUS | Status: DC | PRN
  Administered 2017-12-20: 19:00:00 via INTRAVENOUS

## 2017-12-20 MED ORDER — PROMETHAZINE HCL 25 MG/ML IJ SOLN
12.5000 mg | Freq: Four times a day (QID) | INTRAMUSCULAR | Status: DC | PRN
  Administered 2017-12-21 – 2017-12-22 (×3): 12.5 mg via INTRAVENOUS
  Filled 2017-12-20 (×3): qty 1

## 2017-12-20 MED ORDER — FENTANYL CITRATE (PF) 100 MCG/2ML IJ SOLN
INTRAMUSCULAR | Status: DC | PRN
  Administered 2017-12-20 (×2): 50 ug via INTRAVENOUS

## 2017-12-20 MED ORDER — LACTATED RINGERS IV SOLN
INTRAVENOUS | Status: DC | PRN
  Administered 2017-12-20: 18:00:00 via INTRAVENOUS

## 2017-12-20 MED ORDER — KETOROLAC TROMETHAMINE 30 MG/ML IJ SOLN
15.0000 mg | Freq: Once | INTRAMUSCULAR | Status: AC
  Administered 2017-12-20: 15 mg via INTRAVENOUS
  Filled 2017-12-20: qty 1

## 2017-12-20 MED ORDER — CITALOPRAM HYDROBROMIDE 20 MG PO TABS
20.0000 mg | ORAL_TABLET | Freq: Every day | ORAL | Status: DC
  Administered 2017-12-20 – 2017-12-26 (×6): 20 mg via ORAL
  Filled 2017-12-20 (×6): qty 1

## 2017-12-20 MED ORDER — ALBUMIN HUMAN 5 % IV SOLN
INTRAVENOUS | Status: AC
  Filled 2017-12-20: qty 250

## 2017-12-20 MED ORDER — 0.9 % SODIUM CHLORIDE (POUR BTL) OPTIME
TOPICAL | Status: DC | PRN
  Administered 2017-12-20: 1000 mL

## 2017-12-20 MED ORDER — DEXAMETHASONE SODIUM PHOSPHATE 10 MG/ML IJ SOLN
INTRAMUSCULAR | Status: DC | PRN
  Administered 2017-12-20: 10 mg via INTRAVENOUS

## 2017-12-20 MED ORDER — SODIUM CHLORIDE 0.9 % IR SOLN
Status: DC | PRN
  Administered 2017-12-20: 1000 mL via INTRAVESICAL

## 2017-12-20 MED ORDER — MIDAZOLAM HCL 5 MG/5ML IJ SOLN
INTRAMUSCULAR | Status: DC | PRN
  Administered 2017-12-20: 2 mg via INTRAVENOUS

## 2017-12-20 MED ORDER — ONDANSETRON HCL 4 MG/2ML IJ SOLN
4.0000 mg | Freq: Once | INTRAMUSCULAR | Status: AC
  Administered 2017-12-20: 4 mg via INTRAVENOUS
  Filled 2017-12-20: qty 2

## 2017-12-20 MED ORDER — SODIUM CHLORIDE 0.9 % IV BOLUS
1000.0000 mL | Freq: Once | INTRAVENOUS | Status: AC
  Administered 2017-12-20: 1000 mL via INTRAVENOUS

## 2017-12-20 MED ORDER — SUCCINYLCHOLINE CHLORIDE 200 MG/10ML IV SOSY
PREFILLED_SYRINGE | INTRAVENOUS | Status: AC
  Filled 2017-12-20: qty 10

## 2017-12-20 MED ORDER — ZOLPIDEM TARTRATE 5 MG PO TABS: 5.0000 mg | ORAL_TABLET | Freq: Every evening | ORAL | Status: DC | PRN

## 2017-12-20 MED ORDER — ONDANSETRON HCL 4 MG/2ML IJ SOLN
4.0000 mg | INTRAMUSCULAR | Status: DC | PRN
  Administered 2017-12-21: 4 mg via INTRAVENOUS
  Filled 2017-12-20: qty 2

## 2017-12-20 MED ORDER — PROMETHAZINE HCL 25 MG/ML IJ SOLN
25.0000 mg | Freq: Once | INTRAMUSCULAR | Status: AC
  Administered 2017-12-20: 25 mg via INTRAVENOUS
  Filled 2017-12-20: qty 1

## 2017-12-20 MED ORDER — FENTANYL CITRATE (PF) 100 MCG/2ML IJ SOLN
INTRAMUSCULAR | Status: AC
  Filled 2017-12-20: qty 2

## 2017-12-20 MED ORDER — ONDANSETRON HCL 4 MG/2ML IJ SOLN
INTRAMUSCULAR | Status: DC | PRN
  Administered 2017-12-20: 4 mg via INTRAVENOUS

## 2017-12-20 MED ORDER — HYDROCHLOROTHIAZIDE 25 MG PO TABS
25.0000 mg | ORAL_TABLET | Freq: Every day | ORAL | Status: DC
  Administered 2017-12-22 – 2017-12-26 (×5): 25 mg via ORAL
  Filled 2017-12-20 (×5): qty 1

## 2017-12-20 MED ORDER — DIPHENHYDRAMINE HCL 50 MG/ML IJ SOLN: 12.5000 mg | Freq: Four times a day (QID) | INTRAMUSCULAR | Status: DC | PRN

## 2017-12-20 MED ORDER — METOCLOPRAMIDE HCL 5 MG/ML IJ SOLN
10.0000 mg | Freq: Once | INTRAMUSCULAR | Status: AC
  Administered 2017-12-20: 10 mg via INTRAVENOUS
  Filled 2017-12-20: qty 2

## 2017-12-20 MED ORDER — METOPROLOL TARTRATE 50 MG PO TABS
50.0000 mg | ORAL_TABLET | Freq: Every day | ORAL | Status: DC
  Administered 2017-12-22: 50 mg via ORAL
  Filled 2017-12-20: qty 1

## 2017-12-20 MED ORDER — SODIUM CHLORIDE 0.9 % IR SOLN
Status: DC | PRN
  Administered 2017-12-20: 3000 mL via INTRAVESICAL

## 2017-12-20 MED ORDER — ONDANSETRON HCL 4 MG/2ML IJ SOLN
INTRAMUSCULAR | Status: AC
  Filled 2017-12-20: qty 2

## 2017-12-20 MED ORDER — PROPOFOL 10 MG/ML IV BOLUS
INTRAVENOUS | Status: DC | PRN
  Administered 2017-12-20: 140 mg via INTRAVENOUS

## 2017-12-20 MED ORDER — CEFAZOLIN SODIUM-DEXTROSE 2-4 GM/100ML-% IV SOLN
2.0000 g | Freq: Three times a day (TID) | INTRAVENOUS | Status: DC
  Administered 2017-12-20: 2 g via INTRAVENOUS
  Filled 2017-12-20 (×2): qty 100

## 2017-12-20 MED ORDER — CEFAZOLIN SODIUM-DEXTROSE 2-4 GM/100ML-% IV SOLN
INTRAVENOUS | Status: AC
  Filled 2017-12-20: qty 100

## 2017-12-20 MED ORDER — ASPIRIN EC 81 MG PO TBEC
81.0000 mg | DELAYED_RELEASE_TABLET | Freq: Every day | ORAL | Status: DC
  Administered 2017-12-22 – 2017-12-26 (×5): 81 mg via ORAL
  Filled 2017-12-20 (×5): qty 1

## 2017-12-20 MED ORDER — PHENYLEPHRINE 40 MCG/ML (10ML) SYRINGE FOR IV PUSH (FOR BLOOD PRESSURE SUPPORT)
PREFILLED_SYRINGE | INTRAVENOUS | Status: AC
  Filled 2017-12-20: qty 10

## 2017-12-20 MED ORDER — HYDRALAZINE HCL 20 MG/ML IJ SOLN
10.0000 mg | Freq: Once | INTRAMUSCULAR | Status: AC
  Administered 2017-12-20: 10 mg via INTRAVENOUS

## 2017-12-20 MED ORDER — DIPHENHYDRAMINE HCL 12.5 MG/5ML PO ELIX: 12.5000 mg | ORAL_SOLUTION | Freq: Four times a day (QID) | ORAL | Status: DC | PRN

## 2017-12-20 MED ORDER — LOSARTAN POTASSIUM 50 MG PO TABS
100.0000 mg | ORAL_TABLET | Freq: Every day | ORAL | Status: DC
  Administered 2017-12-22 – 2017-12-26 (×5): 100 mg via ORAL
  Filled 2017-12-20 (×5): qty 2

## 2017-12-20 MED ORDER — OXYCODONE-ACETAMINOPHEN 5-325 MG PO TABS: 1.0000 | ORAL_TABLET | ORAL | Status: DC | PRN

## 2017-12-20 MED ORDER — HYDROMORPHONE HCL 1 MG/ML IJ SOLN: 0.5000 mg | INTRAMUSCULAR | Status: DC | PRN

## 2017-12-20 MED ORDER — SODIUM CHLORIDE 0.9 % IV SOLN
2.0000 g | INTRAVENOUS | Status: DC
  Administered 2017-12-20 – 2017-12-25 (×6): 2 g via INTRAVENOUS
  Filled 2017-12-20 (×3): qty 2
  Filled 2017-12-20: qty 20
  Filled 2017-12-20 (×3): qty 2
  Filled 2017-12-20: qty 20

## 2017-12-20 MED ORDER — PROMETHAZINE HCL 25 MG/ML IJ SOLN
12.5000 mg | Freq: Once | INTRAMUSCULAR | Status: AC
  Administered 2017-12-20: 12.5 mg via INTRAVENOUS
  Filled 2017-12-20: qty 1

## 2017-12-20 MED ORDER — PHENYLEPHRINE 40 MCG/ML (10ML) SYRINGE FOR IV PUSH (FOR BLOOD PRESSURE SUPPORT)
PREFILLED_SYRINGE | INTRAVENOUS | Status: DC | PRN
  Administered 2017-12-20 (×2): 80 ug via INTRAVENOUS

## 2017-12-20 MED ORDER — IOHEXOL 300 MG/ML  SOLN
INTRAMUSCULAR | Status: DC | PRN
  Administered 2017-12-20: 6 mL via URETHRAL

## 2017-12-20 MED ORDER — PROPOFOL 10 MG/ML IV BOLUS
INTRAVENOUS | Status: AC
  Filled 2017-12-20: qty 20

## 2017-12-20 MED ORDER — MIDAZOLAM HCL 2 MG/2ML IJ SOLN
INTRAMUSCULAR | Status: AC
  Filled 2017-12-20: qty 2

## 2017-12-20 MED ORDER — LIDOCAINE 2% (20 MG/ML) 5 ML SYRINGE
INTRAMUSCULAR | Status: DC | PRN
  Administered 2017-12-20: 100 mg via INTRAVENOUS

## 2017-12-20 MED ORDER — FENTANYL CITRATE (PF) 100 MCG/2ML IJ SOLN: 25.0000 ug | INTRAMUSCULAR | Status: DC | PRN

## 2017-12-20 MED ORDER — HYOSCYAMINE SULFATE 0.125 MG SL SUBL
0.1250 mg | SUBLINGUAL_TABLET | SUBLINGUAL | Status: DC | PRN
  Filled 2017-12-20: qty 1

## 2017-12-20 MED ORDER — DEXAMETHASONE SODIUM PHOSPHATE 10 MG/ML IJ SOLN
INTRAMUSCULAR | Status: AC
  Filled 2017-12-20: qty 1

## 2017-12-20 MED ORDER — HYDRALAZINE HCL 20 MG/ML IJ SOLN
INTRAMUSCULAR | Status: AC
  Filled 2017-12-20: qty 1

## 2017-12-20 SURGICAL SUPPLY — 19 items
BAG URO CATCHER STRL LF (MISCELLANEOUS) ×2 IMPLANT
CATH INTERMIT  6FR 70CM (CATHETERS) ×2 IMPLANT
CLOTH BEACON ORANGE TIMEOUT ST (SAFETY) ×2 IMPLANT
COVER WAND RF STERILE (DRAPES) IMPLANT
EXTRACTOR STONE NITINOL NGAGE (UROLOGICAL SUPPLIES) IMPLANT
FIBER LASER TRAC TIP (UROLOGICAL SUPPLIES) IMPLANT
GLOVE BIO SURGEON STRL SZ8 (GLOVE) ×2 IMPLANT
GOWN STRL REUS W/TWL XL LVL3 (GOWN DISPOSABLE) ×2 IMPLANT
GUIDEWIRE ANG ZIPWIRE 038X150 (WIRE) ×2 IMPLANT
GUIDEWIRE STR DUAL SENSOR (WIRE) ×1 IMPLANT
IV NS 1000ML (IV SOLUTION) ×2
IV NS 1000ML BAXH (IV SOLUTION) ×1 IMPLANT
MANIFOLD NEPTUNE II (INSTRUMENTS) ×2 IMPLANT
PACK CYSTO (CUSTOM PROCEDURE TRAY) ×2 IMPLANT
SHEATH URETERAL 12FRX35CM (MISCELLANEOUS) IMPLANT
STENT CONTOUR 6FRX26X.038 (STENTS) IMPLANT
STENT CONTOUR 7FRX24 (STENTS) ×1 IMPLANT
TUBE FEEDING 8FR 16IN STR KANG (MISCELLANEOUS) IMPLANT
TUBING CONNECTING 10 (TUBING) ×2 IMPLANT

## 2017-12-20 NOTE — ED Triage Notes (Signed)
Pt here for continues to have N/V and abdominal pain. Pt seen here Monday for the same. Pt with dry heaves in triage. Pt has been vomiting all week.

## 2017-12-20 NOTE — Anesthesia Procedure Notes (Signed)
Procedure Name: Intubation Date/Time: 12/20/2017 6:19 PM Performed by: Cherri Yera D, CRNA Pre-anesthesia Checklist: Patient identified, Emergency Drugs available, Suction available and Patient being monitored Patient Re-evaluated:Patient Re-evaluated prior to induction Oxygen Delivery Method: Circle system utilized Preoxygenation: Pre-oxygenation with 100% oxygen Induction Type: IV induction, Rapid sequence and Cricoid Pressure applied Laryngoscope Size: Mac and 4 Grade View: Grade III Tube type: Oral Tube size: 7.5 mm Number of attempts: 1 Airway Equipment and Method: Stylet Placement Confirmation: ETT inserted through vocal cords under direct vision,  positive ETCO2 and breath sounds checked- equal and bilateral Secured at: 20 cm Tube secured with: Tape Dental Injury: Teeth and Oropharynx as per pre-operative assessment

## 2017-12-20 NOTE — Brief Op Note (Signed)
12/20/2017  6:55 PM  PATIENT:  Sheryl Bates  62 y.o. female  PRE-OPERATIVE DIAGNOSIS:  left ureteral stone  POST-OPERATIVE DIAGNOSIS:  left ureteral stone  PROCEDURE:  Procedure(s): CYSTOSCOPY/RETROGRADE/URETEROSCOPY/ LEFT URETERAL STENT PLACEMENT (Left)  SURGEON:  Surgeon(s) and Role:    * Brin Ruggerio, Mardene Celeste, MD - Primary  PHYSICIAN ASSISTANT: none  ASSISTANTS: none   ANESTHESIA:   general  EBL:  none   BLOOD ADMINISTERED:none  DRAINS: left 6x24 JJ ureteral stent   LOCAL MEDICATIONS USED:  NONE  SPECIMEN:  No Specimen  DISPOSITION OF SPECIMEN:  N/A  COUNTS:  YES  TOURNIQUET:  * No tourniquets in log *  DICTATION: .Note written in EPIC  PLAN OF CARE: Admit for overnight observation  PATIENT DISPOSITION:  PACU - hemodynamically stable.   Delay start of Pharmacological VTE agent (>24hrs) due to surgical blood loss or risk of bleeding: not applicable

## 2017-12-20 NOTE — ED Notes (Signed)
Pt given water for PO challenge. Pt was able to rest for awhile but after getting up to the bathroom she is dry heaving again. EDP made aware.

## 2017-12-20 NOTE — Anesthesia Preprocedure Evaluation (Addendum)
Anesthesia Evaluation  Patient identified by MRN, date of birth, ID band Patient awake    Reviewed: Allergy & Precautions, NPO status , Patient's Chart, lab work & pertinent test results  Airway Mallampati: II  TM Distance: >3 FB     Dental   Pulmonary former smoker,    breath sounds clear to auscultation       Cardiovascular hypertension,  Rhythm:Regular Rate:Normal     Neuro/Psych    GI/Hepatic negative GI ROS, Neg liver ROS,   Endo/Other    Renal/GU Renal disease     Musculoskeletal   Abdominal   Peds  Hematology   Anesthesia Other Findings   Reproductive/Obstetrics                             Anesthesia Physical Anesthesia Plan  ASA: III  Anesthesia Plan: General   Post-op Pain Management:    Induction: Intravenous, Rapid sequence and Cricoid pressure planned  PONV Risk Score and Plan: 3 and Midazolam, Ondansetron and Dexamethasone  Airway Management Planned: Oral ETT  Additional Equipment:   Intra-op Plan:   Post-operative Plan: Extubation in OR  Informed Consent: I have reviewed the patients History and Physical, chart, labs and discussed the procedure including the risks, benefits and alternatives for the proposed anesthesia with the patient or authorized representative who has indicated his/her understanding and acceptance.   Dental advisory given  Plan Discussed with: Anesthesiologist and CRNA  Anesthesia Plan Comments:        Anesthesia Quick Evaluation

## 2017-12-20 NOTE — ED Notes (Addendum)
Pt unable to void at this time per visitor. Made aware of the need for a sample.

## 2017-12-20 NOTE — ED Notes (Signed)
Patient transported to X-ray 

## 2017-12-20 NOTE — ED Provider Notes (Signed)
[facility] EMERGENCY DEPARTMENT Provider Note   CSN: [id number] Arrival date & time: 12/20/17  1008     History   Chief Complaint Chief Complaint  Patient presents with  . Emesis  . Abdominal Pain    HPI Sheryl Bates is a 62 y.o. female.  Sheryl Bates is a 62 y.o. Female with a history of hypertension, hyperlipidemia, endometriosis and kidney stones, who presents to the emergency department for severe nausea, vomiting and abdominal pain.  She was seen 5 days ago in the emergency department for similar symptoms and diagnosed with a 4 mm left proximal ureteral stone.  She was discharged home after improvement in her symptoms with Keflex, pain medication, Phenergan suppositories and Flomax, but reports spite using this medication she has had severe nausea and vomiting that is persisted and for the past 2 days she has been unable occasions.  Phenergan suppositories not giving any relief.  She denies fevers, reports some chills and that even when she has nothing on her stomach she is been persistently dry heaving.  No focal abdominal pain she continues to have dull pain over the left flank but that pain has improved in comparison to when she first arrived in the ED 5 days ago.  She has not followed up with urology yet.  Reports she has been straining her urine and has not passed any stones.     Past Medical History:  Diagnosis Date  . Allergy   . CIN I (cervical intraepithelial neoplasia I)   . Endometriosis   . Hyperlipidemia   . Hypertension   . Kidney stone   . Ovarian cyst    right  . Vitamin D deficiency     Patient Active Problem List   Diagnosis Date Noted  . Ureteral stone 12/20/2017  . Hypokalemia 03/13/2016  . Dehydration 03/13/2016  . Nausea & vomiting 03/13/2016  . Back pain 03/13/2016  . Kidney stone   . Hypertension   . Endometriosis   . CIN I (cervical intraepithelial neoplasia I)   . Ovarian cyst   . Vitamin D deficiency   .  Hyperlipidemia     Past Surgical History:  Procedure Laterality Date  . CERVICAL BIOPSY  W/ LOOP ELECTRODE EXCISION    . COLPOSCOPY    . ESOPHAGOGASTRODUODENOSCOPY N/A 03/18/2016   Procedure: ESOPHAGOGASTRODUODENOSCOPY (EGD);  Surgeon: Bernette Redbird, MD;  Location: [facility];  Service: Endoscopy;  Laterality: N/A;  . IR GENERIC HISTORICAL  03/15/2016   IR EPIDUROGRAPHY 03/15/2016 [facility]  . KNEE SURGERY     Right  . LAPAROSCOPY  1996  . LEEP  1992  . PELVIC LAPAROSCOPY       OB History    Gravida  1   Para      Term      Preterm      AB  1   Living  0     SAB      TAB      Ectopic      Multiple      Live Births               Home Medications    Prior to Admission medications   Medication Sig Start Date End Date Taking? Authorizing Provider  amLODipine (NORVASC) 10 MG tablet Take 1 tablet (10 mg total) by mouth daily. 03/23/16   Renae Fickle, MD  aspirin EC 81 MG tablet Take 81 mg by mouth daily.    [provider]  cephALEXin (KEFLEX) 500 MG capsule Take 1 capsule (500 mg total) by mouth 3 (three) times daily for 7 days. 12/15/17 12/22/17  Nira Conn, MD  Cholecalciferol (VITAMIN D PO) Take 2 capsules by mouth daily with breakfast.    [provider]  citalopram (CELEXA) 20 MG tablet Take 1 tablet (20 mg total) by mouth daily. 11/24/13   [name], MD  gabapentin (NEURONTIN) 100 MG capsule Take 1 capsule (100 mg total) by mouth at bedtime. 03/22/16   Renae Fickle, MD  hydrALAZINE (APRESOLINE) 25 MG tablet Take 1 tablet (25 mg total) by mouth every 8 (eight) hours. 03/22/16   Renae Fickle, MD  hydrochlorothiazide (HYDRODIURIL) 25 MG tablet TAKE ONE TABLET BY MOUTH ONCE DAILY 08/10/14   Swords, Valetta Mole, MD  HYDROcodone-acetaminophen (NORCO/VICODIN) 5-325 MG tablet Take 0.5-1 tablets by mouth every 8 (eight) hours as needed for up to 5 days for severe pain (That is not improved by your scheduled motrin regimen).  Please do not exceed 4000 mg of acetaminophen (Tylenol) a 24-hour period. Please note that he may be prescribed additional medicine that contains acetaminophen. 12/15/17 12/20/17  Nira Conn, MD  ibuprofen (ADVIL,MOTRIN) 600 MG tablet Take 1 tablet (600 mg total) by mouth every 6 (six) hours as needed. 12/15/17   Nira Conn, MD  losartan (COZAAR) 100 MG tablet Take 1 tablet (100 mg total) by mouth daily. TAKE ONE TABLET BY MOUTH ONE TIME DAILY Patient taking differently: Take 100 mg by mouth daily.  07/19/13   Swords, Valetta Mole, MD  metoprolol tartrate (LOPRESSOR) 50 MG tablet Take 1 tablet (50 mg total) by mouth 2 (two) times daily. Patient taking differently: Take 50 mg by mouth daily with breakfast.  07/19/13   Swords, Valetta Mole, MD  omeprazole (PRILOSEC) 40 MG capsule Take 1 capsule (40 mg total) by mouth daily. 03/22/16   Renae Fickle, MD  promethazine (PHENERGAN) 25 MG suppository Place 1 suppository (25 mg total) rectally every 6 (six) hours as needed for nausea. 12/15/17   Derwood Kaplan, MD    Family History Family History  Problem Relation Age of Onset  . Hypertension Mother   . Heart disease Mother   . Diabetes Mother   . Heart disease Father 2       CHF    Social History Social History   Tobacco Use  . Smoking status: Former Smoker    Last attempt to quit: 05/03/2005    Years since quitting: 12.6  . Smokeless tobacco: Never Used  Substance Use Topics  . Alcohol use: No    Comment: rare  . Drug use: No     Allergies   Sulfonamide derivatives and Lisinopril   Review of Systems Review of Systems  Constitutional: Negative for chills and fever.  HENT: Negative.   Respiratory: Negative for cough and shortness of breath.   Cardiovascular: Negative for chest pain.  Gastrointestinal: Positive for abdominal pain, nausea and vomiting.  Genitourinary: Positive for flank pain.  Musculoskeletal: Negative for back pain.  Skin: Negative for color change and  rash.  Neurological: Negative for dizziness, syncope and light-headedness.  All other systems reviewed and are negative.    Physical Exam Updated Vital Signs BP (!) 180/114 (BP Location: Left Arm)   Pulse (!) 102   Temp 98.4 F (36.9 C) (Oral)   Resp 20   Ht 5\' 3"  (1.6 m)   Wt 90.7 kg   SpO2 98%   BMI 35.42 kg/m   Physical Exam  Constitutional: She appears well-developed and well-nourished. She appears ill. No distress.  Patient appears uncomfortable, actively dry heaving on arrival  HENT:  Head: Normocephalic and atraumatic.  Posterior oropharynx clear, mucous membranes slightly dry  Eyes: Right eye exhibits no discharge. Left eye exhibits no discharge.  Neck: Neck supple.  Cardiovascular: Normal rate, regular rhythm, normal heart sounds and intact distal pulses. Exam reveals no gallop and no friction rub.  No murmur heard. Pulmonary/Chest: Effort normal and breath sounds normal. No respiratory distress.  Respirations equal and unlabored, patient able to speak in full sentences, lungs clear to auscultation bilaterally  Abdominal: Soft. Normal appearance and bowel sounds are normal. There is no tenderness. There is CVA tenderness.  Abdomen soft, nondistended, bowel sounds present throughout, there is no focal abdominal tenderness, there is mild left CVA tenderness  Musculoskeletal: She exhibits no deformity.  Neurological: She is alert. Coordination normal.  Skin: Skin is warm and dry. Capillary refill takes less than 2 seconds. She is not diaphoretic.  Psychiatric: She has a normal mood and affect. Her behavior is normal.  Nursing note and vitals reviewed.    ED Treatments / Results  Labs (all labs ordered are listed, but only abnormal results are displayed) Labs Reviewed  COMPREHENSIVE METABOLIC PANEL - Abnormal; Notable for the following components:      Result Value   Potassium 3.1 (*)    Glucose, Bld 142 (*)    ALT 51 (*)    Total Bilirubin 1.4 (*)    All other  components within normal limits  CBC - Abnormal; Notable for the following components:   Hemoglobin 15.5 (*)    HCT 46.1 (*)    All other components within normal limits  URINALYSIS, ROUTINE W REFLEX MICROSCOPIC - Abnormal; Notable for the following components:   APPearance CLOUDY (*)    Hgb urine dipstick TRACE (*)    Ketones, ur 40 (*)    Leukocytes, UA MODERATE (*)    All other components within normal limits  URINALYSIS, MICROSCOPIC (REFLEX) - Abnormal; Notable for the following components:   Bacteria, UA MANY (*)    All other components within normal limits  LIPASE, BLOOD    EKG EKG Interpretation  Date/Time:  Saturday December 20 2017 12:08:41 EST Ventricular Rate:  90 PR Interval:    QRS Duration: 83 QT Interval:  387 QTC Calculation: 474 R Axis:   -56 Text Interpretation:  Sinus rhythm Left anterior fascicular block Abnormal R-wave progression, late transition , increased since last tracing Confirmed by Linwood Dibbles [phone]) on 12/20/2017 12:12:58 PM Also confirmed by Linwood Dibbles [phone]), editor Barbette Hair [phone])  on 12/20/2017 2:51:49 PM   Radiology Dg Abdomen 1 View  Result Date: 12/20/2017 CLINICAL DATA:  Known renal stone. Complaining of nausea, vomiting and constipation. EXAM: ABDOMEN - 1 VIEW COMPARISON:  CT, 12/15/2017 FINDINGS: Pole intrarenal calculus on the left is stable from the prior CT. Normal bowel gas pattern.  No significant increase in colonic stool. No other renal stones. No evidence of a ureteral stone. Soft tissues are otherwise unremarkable. No acute skeletal abnormality. IMPRESSION: 1. No acute findings. No evidence of bowel obstruction. No significant increase in colonic stool. 2. Stable left intrarenal stone. Electronically Signed   By: Amie Portland M.D.   On: 12/20/2017 11:29    Procedures Procedures (including critical care time)  Medications Ordered in ED Medications  ondansetron (ZOFRAN) injection 4 mg (4 mg Intravenous Given 12/20/17 1036)    sodium chloride 0.9 % bolus  1,000 mL (0 mLs Intravenous Stopped 12/20/17 1354)  promethazine (PHENERGAN) injection 25 mg (25 mg Intravenous Given 12/20/17 1050)  metoCLOPramide (REGLAN) injection 10 mg (10 mg Intravenous Given 12/20/17 1206)  ketorolac (TORADOL) 30 MG/ML injection 15 mg (15 mg Intravenous Given 12/20/17 1206)     Initial Impression / Assessment and Plan / ED Course  I have reviewed the triage vital signs and the nursing notes.  Pertinent labs & imaging results that were available during my care of the patient were reviewed by me and considered in my medical decision making (see chart for details).  Patient presents to the emergency department for persistent nausea and vomiting as well as a dull left flank pain, was seen 5 days prior and diagnosed with 4 mm left obstructing ureteral stone, was discharged home on antibiotics for mild signs of UTI, pain medication, Phenergan suppositories and Flomax but presents today due to persistent and worsening symptoms.  She repeatedly dry heaving on exam, abdominal exam is benign with very mild left CVA tenderness.  Will check abdominal labs and KUB to check for placement of stone.  Will give IV fluids and antiemetics.  Small dose of Toradol for discomfort.  Labs show no leukocytosis, hemoglobin slightly elevated I think this is likely due to hemoconcentration, hypokalemia of 3.1 likely in the setting of vomiting, mildly elevated glucose of 142, normal renal function, normal renal liver function, normal lipase.  Urinalysis with moderate leukocytes, many bacteria, some white cells present.  Despite 3 different antiemetics patient continues to dry heave and is unable to tolerate p.o., the patient will require admission for further antiemetics, hydration and will likely need surgical intervention for ureteral stone as this seems to be the root of patient's symptoms.  Will discuss with urology.  Case discussed with Dr. Ronne Binning with Urology, who  accepts patient for admission and transfer to [facility] long for further evaluation and treatment, requests pr remain NPO, will likely go to OR later today for cytoscopy and ureteroscopy.  Final Clinical Impressions(s) / ED Diagnoses   Final diagnoses:  Ureteral stone  Intractable vomiting with nausea, unspecified vomiting type  Left flank pain    ED Discharge Orders    None       Legrand Rams 12/20/17 2007    Linwood Dibbles, MD 12/21/17 1422

## 2017-12-20 NOTE — Transfer of Care (Signed)
Immediate Anesthesia Transfer of Care Note  Patient: Sheryl Bates  Procedure(s) Performed: CYSTOSCOPY/RETROGRADE/URETEROSCOPY/ LEFT URETERAL STENT PLACEMENT (Left Ureter)  Patient Location: PACU  Anesthesia Type:General  Level of Consciousness: drowsy and patient cooperative  Airway & Oxygen Therapy: Patient Spontanous Breathing and Patient connected to face mask oxygen  Post-op Assessment: Report given to RN and Post -op Vital signs reviewed and stable  Post vital signs: Reviewed and stable  Last Vitals:  Vitals Value Taken Time  BP 163/69 12/20/2017  7:03 PM  Temp    Pulse 91 12/20/2017  7:07 PM  Resp 18 12/20/2017  7:07 PM  SpO2 100 % 12/20/2017  7:07 PM  Vitals shown include unvalidated device data.  Last Pain:  Vitals:   12/20/17 1636  TempSrc: Oral  PainSc:       Patients Stated Pain Goal: 3 (12/20/17 1636)  Complications: No apparent anesthesia complications

## 2017-12-20 NOTE — H&P (Signed)
Urology Admission H&P  Chief Complaint: left flank pain  History of Present Illness: Sheryl Bates is a 62yo with a hx of HTN, who presented to [facility] with a 1 week history of left flank pain and intractable nausea/vomiting. This is her first stone event. The pain is dull constant severe and nonradiaitng and started 1 week ago. The nausea/vomiting started today. No other associated symptoms. No other exacerbating/alleviating events. She has been tried on 3 antiemetics since admission and has refractory nausea/vomiting. No fevers/chills/sweats. Ct scan from 12/15/2017 shows a 4mm left UVJ calculus with mild to moderate hydronephrosis  Past Medical History:  Diagnosis Date  . Allergy   . CIN I (cervical intraepithelial neoplasia I)   . Endometriosis   . Hyperlipidemia   . Hypertension   . Kidney stone   . Ovarian cyst    right  . Vitamin D deficiency    Past Surgical History:  Procedure Laterality Date  . CERVICAL BIOPSY  W/ LOOP ELECTRODE EXCISION    . COLPOSCOPY    . ESOPHAGOGASTRODUODENOSCOPY N/A 03/18/2016   Procedure: ESOPHAGOGASTRODUODENOSCOPY (EGD);  Surgeon: Bernette Redbird, MD;  Location: [facility];  Service: Endoscopy;  Laterality: N/A;  . IR GENERIC HISTORICAL  03/15/2016   IR EPIDUROGRAPHY 03/15/2016 [facility]  . KNEE SURGERY     Right  . LAPAROSCOPY  1996  . LEEP  1992  . PELVIC LAPAROSCOPY      Home Medications:  Current Facility-Administered Medications  Medication Dose Route Frequency Provider Last Rate Last Dose  . ceFAZolin (ANCEF) IVPB 2g/100 mL premix  2 g Intravenous Q8H Collette Pescador, Mardene Celeste, MD      . hydrALAZINE (APRESOLINE) 20 MG/ML injection           . [START ON 12/21/2017] Influenza vac split quadrivalent PF (FLUARIX) injection 0.5 mL  0.5 mL Intramuscular Tomorrow-1000 Daira Hine, Mardene Celeste, MD       Allergies:  Allergies  Allergen Reactions  . Sulfonamide Derivatives Diarrhea and Nausea Only  . Lisinopril Cough    Family History   Problem Relation Age of Onset  . Hypertension Mother   . Heart disease Mother   . Diabetes Mother   . Heart disease Father 40       CHF   Social History:  reports that she quit smoking about 12 years ago. She has never used smokeless tobacco. She reports that she does not drink alcohol or use drugs.  Review of Systems  Gastrointestinal: Positive for nausea and vomiting.  Genitourinary: Positive for flank pain.  All other systems reviewed and are negative.   Physical Exam:  Vital signs in last 24 hours: Temp:  [98.4 F (36.9 C)-99.2 F (37.3 C)] 99.1 F (37.3 C) (11/09 1636) Pulse Rate:  [65-102] 92 (11/09 1717) Resp:  [20-22] 22 (11/09 1636) BP: (180-228)/(81-114) 190/94 (11/09 1717) SpO2:  [93 %-99 %] 99 % (11/09 1636) Weight:  [90.7 kg] 90.7 kg (11/09 1018) Physical Exam  Constitutional: She is oriented to person, place, and time. She appears well-developed and well-nourished.  HENT:  Head: Normocephalic and atraumatic.  Eyes: Pupils are equal, round, and reactive to light. EOM are normal.  Neck: Normal range of motion. No thyromegaly present.  Cardiovascular: Normal rate and regular rhythm.  Respiratory: Effort normal. No respiratory distress.  GI: Soft. She exhibits no distension.  Musculoskeletal: Normal range of motion. She exhibits no edema.  Neurological: She is alert and oriented to person, place, and time.  Skin: Skin is warm and dry.  Psychiatric: She has a normal mood and affect. Her behavior is normal. Judgment and thought content normal.    Laboratory Data:  Results for orders placed or performed during the hospital encounter of 12/20/17 (from the past 24 hour(s))  Lipase, blood     Status: None   Collection Time: 12/20/17 10:46 AM  Result Value Ref Range   Lipase 32 11 - 51 U/L  Comprehensive metabolic panel     Status: Abnormal   Collection Time: 12/20/17 10:46 AM  Result Value Ref Range   Sodium 138 135 - 145 mmol/L   Potassium 3.1 (L) 3.5 - 5.1  mmol/L   Chloride 101 98 - 111 mmol/L   CO2 26 22 - 32 mmol/L   Glucose, Bld 142 (H) 70 - 99 mg/dL   BUN 13 8 - 23 mg/dL   Creatinine, Ser 4.09 0.44 - 1.00 mg/dL   Calcium 9.5 8.9 - 81.1 mg/dL   Total Protein 6.7 6.5 - 8.1 g/dL   Albumin 4.2 3.5 - 5.0 g/dL   AST 41 15 - 41 U/L   ALT 51 (H) 0 - 44 U/L   Alkaline Phosphatase 66 38 - 126 U/L   Total Bilirubin 1.4 (H) 0.3 - 1.2 mg/dL   GFR calc non Af Amer >60 >60 mL/min   GFR calc Af Amer >60 >60 mL/min   Anion gap 11 5 - 15  CBC     Status: Abnormal   Collection Time: 12/20/17 10:46 AM  Result Value Ref Range   WBC 7.3 4.0 - 10.5 K/uL   RBC 4.98 3.87 - 5.11 MIL/uL   Hemoglobin 15.5 (H) 12.0 - 15.0 g/dL   HCT 91.4 (H) 78.2 - 95.6 %   MCV 92.6 80.0 - 100.0 fL   MCH 31.1 26.0 - 34.0 pg   MCHC 33.6 30.0 - 36.0 g/dL   RDW 21.3 08.6 - 57.8 %   Platelets 180 150 - 400 K/uL   nRBC 0.0 0.0 - 0.2 %  Urinalysis, Routine w reflex microscopic     Status: Abnormal   Collection Time: 12/20/17  1:55 PM  Result Value Ref Range   Color, Urine YELLOW YELLOW   APPearance CLOUDY (A) CLEAR   Specific Gravity, Urine 1.020 1.005 - 1.030   pH 7.0 5.0 - 8.0   Glucose, UA NEGATIVE NEGATIVE mg/dL   Hgb urine dipstick TRACE (A) NEGATIVE   Bilirubin Urine NEGATIVE NEGATIVE   Ketones, ur 40 (A) NEGATIVE mg/dL   Protein, ur NEGATIVE NEGATIVE mg/dL   Nitrite NEGATIVE NEGATIVE   Leukocytes, UA MODERATE (A) NEGATIVE  Urinalysis, Microscopic (reflex)     Status: Abnormal   Collection Time: 12/20/17  1:55 PM  Result Value Ref Range   RBC / HPF 6-10 0 - 5 RBC/hpf   WBC, UA 11-20 0 - 5 WBC/hpf   Bacteria, UA MANY (A) NONE SEEN   Squamous Epithelial / LPF 6-10 0 - 5   Mucus PRESENT    Recent Results (from the past 240 hour(s))  Urine culture     Status: None   Collection Time: 12/15/17  5:47 AM  Result Value Ref Range Status   Specimen Description   Final    URINE, CLEAN CATCH Performed at [facility], [address] [place], [place]  [address]    Special Requests   Final    NONE Performed at [facility], [address] [place], [place] [address]    Culture  Final    NO GROWTH Performed at [facility], [address] [place], [place] [address]    Report Status 12/16/2017 FINAL  Final   Creatinine: Recent Labs    12/15/17 0345 12/20/17 1046  CREATININE 0.91 0.86   Baseline Creatinine: 0.9  Impression/Assessment:  62yo with a left ureteral calculus and intractable nausea  Plan:  The risks/benefits/alternatives to left ureteroscopic stone extraction was explained to the patient and she understands and wishes to proceed with surgery  Wilkie Aye 12/20/2017, 5:37 PM

## 2017-12-21 MED ORDER — LACTULOSE 10 GM/15ML PO SOLN: 20.0000 g | Freq: Every day | ORAL | Status: DC | PRN

## 2017-12-21 MED ORDER — PROCHLORPERAZINE EDISYLATE 10 MG/2ML IJ SOLN
10.0000 mg | INTRAMUSCULAR | Status: DC | PRN
  Administered 2017-12-21: 10 mg via INTRAVENOUS
  Filled 2017-12-21: qty 2

## 2017-12-21 MED ORDER — FLEET ENEMA 7-19 GM/118ML RE ENEM
1.0000 | ENEMA | Freq: Once | RECTAL | Status: AC
  Administered 2017-12-21: 1 via RECTAL
  Filled 2017-12-21: qty 1

## 2017-12-21 NOTE — Progress Notes (Signed)
Fleets enema administered as per MD order, patient with small amount of stool returned.

## 2017-12-21 NOTE — Progress Notes (Signed)
1 Day Post-Op Subjective: Patient reports mild improvement in her nausea. No vomiting. Patient has not had BM in 5 days  Objective: Vital signs in last 24 hours: Temp:  [98 F (36.7 C)-99.2 F (37.3 C)] 98.3 F (36.8 C) (11/10 1434) Pulse Rate:  [56-92] 63 (11/10 1434) Resp:  [13-20] 20 (11/10 1434) BP: (163-178)/(65-88) 169/65 (11/10 1434) SpO2:  [96 %-100 %] 98 % (11/10 0515)  Intake/Output from previous day: 11/09 0701 - 11/10 0700 In: 2300 [I.V.:950; IV Piggyback:1350] Out: 750 [Urine:750] Intake/Output this shift: No intake/output data recorded.  Physical Exam:  General:alert, cooperative and appears stated age GI: soft, non tender, normal bowel sounds, no palpable masses, no organomegaly, no inguinal hernia Female genitalia: not done Extremities: extremities normal, atraumatic, no cyanosis or edema  Lab Results: Recent Labs    12/20/17 1046  HGB 15.5*  HCT 46.1*   BMET Recent Labs    12/20/17 1046  NA 138  K 3.1*  CL 101  CO2 26  GLUCOSE 142*  BUN 13  CREATININE 0.86  CALCIUM 9.5   No results for input(s): LABPT, INR in the last 72 hours. No results for input(s): LABURIN in the last 72 hours. Results for orders placed or performed during the hospital encounter of 12/20/17  Surgical pcr screen     Status: None   Collection Time: 12/20/17  5:22 PM  Result Value Ref Range Status   MRSA, PCR NEGATIVE NEGATIVE Final   Staphylococcus aureus NEGATIVE NEGATIVE Final    Comment: (NOTE) The Xpert SA Assay (FDA approved for NASAL specimens in patients 2 years of age and older), is one component of a comprehensive surveillance program. It is not intended to diagnose infection nor to guide or monitor treatment. Performed at [facility], [address], [place], [place] [address]     Studies/Results: Dg Abdomen 1 View  Result Date: 12/20/2017 CLINICAL DATA:  Known renal stone. Complaining of nausea, vomiting and constipation. EXAM:  ABDOMEN - 1 VIEW COMPARISON:  CT, 12/15/2017 FINDINGS: Pole intrarenal calculus on the left is stable from the prior CT. Normal bowel gas pattern.  No significant increase in colonic stool. No other renal stones. No evidence of a ureteral stone. Soft tissues are otherwise unremarkable. No acute skeletal abnormality. IMPRESSION: 1. No acute findings. No evidence of bowel obstruction. No significant increase in colonic stool. 2. Stable left intrarenal stone. Electronically Signed   By: Amie Portland M.D.   On: 12/20/2017 11:29   Dg C-arm 1-60 Min-no Report  Result Date: 12/20/2017 Fluoroscopy was utilized by the requesting physician.  No radiographic interpretation.    Assessment/Plan: 62yo with nephrolithiasis and refractory nausea  1. Patient is s/p ureteroscopy and stent placement. Stent will remain in place until Tuesday 2. Nausea: compazine prn. Fleets enema and lactulose for constipation   LOS: 0 days   Wilkie Aye 12/21/2017, 6:31 PM

## 2017-12-21 NOTE — Plan of Care (Signed)
Patient voiding without difficulty, some burning, blood tinged urine however main issue has continued to be nausea.  Medicated for nausea multiple times this shift, patient says the only time she is not nauseous is when she is asleep.  Patient also says she had this problem before in 2018 and had to learn to "retrain" her brain to eat and keep food down.

## 2017-12-22 ENCOUNTER — Observation Stay (HOSPITAL_COMMUNITY): Payer: BLUE CROSS/BLUE SHIELD

## 2017-12-22 ENCOUNTER — Encounter (HOSPITAL_COMMUNITY): Payer: Self-pay | Admitting: Urology

## 2017-12-22 DIAGNOSIS — R110 Nausea: Secondary | ICD-10-CM

## 2017-12-22 DIAGNOSIS — K5900 Constipation, unspecified: Secondary | ICD-10-CM

## 2017-12-22 DIAGNOSIS — I10 Essential (primary) hypertension: Secondary | ICD-10-CM

## 2017-12-22 DIAGNOSIS — I160 Hypertensive urgency: Secondary | ICD-10-CM

## 2017-12-22 MED ORDER — PROMETHAZINE HCL 25 MG/ML IJ SOLN
25.0000 mg | Freq: Four times a day (QID) | INTRAMUSCULAR | Status: DC | PRN
  Administered 2017-12-22 – 2017-12-23 (×2): 25 mg via INTRAVENOUS
  Filled 2017-12-22 (×2): qty 1

## 2017-12-22 MED ORDER — POLYETHYLENE GLYCOL 3350 17 G PO PACK
17.0000 g | PACK | Freq: Every day | ORAL | Status: DC
  Administered 2017-12-22 – 2017-12-23 (×2): 17 g via ORAL
  Filled 2017-12-22 (×5): qty 1

## 2017-12-22 MED ORDER — METOCLOPRAMIDE HCL 5 MG/ML IJ SOLN
5.0000 mg | Freq: Four times a day (QID) | INTRAMUSCULAR | Status: DC
  Administered 2017-12-22 – 2017-12-25 (×14): 5 mg via INTRAVENOUS
  Filled 2017-12-22 (×17): qty 2

## 2017-12-22 MED ORDER — HYDRALAZINE HCL 20 MG/ML IJ SOLN
10.0000 mg | Freq: Four times a day (QID) | INTRAMUSCULAR | Status: DC | PRN
  Administered 2017-12-22: 10 mg via INTRAVENOUS
  Filled 2017-12-22: qty 1

## 2017-12-22 MED ORDER — METOPROLOL TARTRATE 50 MG PO TABS
50.0000 mg | ORAL_TABLET | Freq: Two times a day (BID) | ORAL | Status: DC
  Administered 2017-12-22 – 2017-12-26 (×8): 50 mg via ORAL
  Filled 2017-12-22 (×5): qty 2
  Filled 2017-12-22 (×2): qty 1
  Filled 2017-12-22: qty 2

## 2017-12-22 MED ORDER — PANTOPRAZOLE SODIUM 40 MG PO TBEC
40.0000 mg | DELAYED_RELEASE_TABLET | Freq: Every day | ORAL | Status: DC
  Administered 2017-12-22 – 2017-12-26 (×5): 40 mg via ORAL
  Filled 2017-12-22 (×5): qty 1

## 2017-12-22 MED ORDER — SODIUM CHLORIDE 0.9 % IV SOLN
INTRAVENOUS | Status: DC | PRN
  Administered 2017-12-22 – 2017-12-24 (×2): 500 mL via INTRAVENOUS

## 2017-12-22 MED ORDER — NICARDIPINE HCL IN NACL 20-0.86 MG/200ML-% IV SOLN
3.0000 mg/h | INTRAVENOUS | Status: DC
  Administered 2017-12-22 – 2017-12-23 (×5): 5 mg/h via INTRAVENOUS
  Administered 2017-12-23: 2.5 mg/h via INTRAVENOUS
  Administered 2017-12-23 – 2017-12-24 (×5): 5 mg/h via INTRAVENOUS
  Filled 2017-12-22 (×11): qty 200

## 2017-12-22 NOTE — Progress Notes (Signed)
2 Days Post-Op Subjective: Patient reports mild improvement in her nausea. No vomiting. Patient transferred to ICU for hypertensive crisis  Objective: Vital signs in last 24 hours: Temp:  [98.6 F (37 C)-99.5 F (37.5 C)] 98.6 F (37 C) (11/11 1934) Pulse Rate:  [52-89] 77 (11/11 2000) Resp:  [10-22] 21 (11/11 2000) BP: (82-238)/(37-99) 149/52 (11/11 2000) SpO2:  [91 %-99 %] 93 % (11/11 2000) Weight:  [85.7 kg] 85.7 kg (11/11 1103)  Intake/Output from previous day: 11/10 0701 - 11/11 0700 In: 149.9 [P.O.:50; IV Piggyback:99.9] Out: 500 [Urine:500] Intake/Output this shift: Total I/O In: -  Out: 400 [Urine:400]  Physical Exam:  General:alert, cooperative and appears stated age GI: soft, non tender, normal bowel sounds, no palpable masses, no organomegaly, no inguinal hernia Female genitalia: not done Extremities: extremities normal, atraumatic, no cyanosis or edema  Lab Results: Recent Labs    12/20/17 1046  HGB 15.5*  HCT 46.1*   BMET Recent Labs    12/20/17 1046  NA 138  K 3.1*  CL 101  CO2 26  GLUCOSE 142*  BUN 13  CREATININE 0.86  CALCIUM 9.5   No results for input(s): LABPT, INR in the last 72 hours. No results for input(s): LABURIN in the last 72 hours. Results for orders placed or performed during the hospital encounter of 12/20/17  Surgical pcr screen     Status: None   Collection Time: 12/20/17  5:22 PM  Result Value Ref Range Status   MRSA, PCR NEGATIVE NEGATIVE Final   Staphylococcus aureus NEGATIVE NEGATIVE Final    Comment: (NOTE) The Xpert SA Assay (FDA approved for NASAL specimens in patients 63 years of age and older), is one component of a comprehensive surveillance program. It is not intended to diagnose infection nor to guide or monitor treatment. Performed at [facility], [address], [place], [place] [address]     Studies/Results: Dg Abd 1 View  Result Date: 12/22/2017 CLINICAL DATA:  Vomiting.  Kidney  stone. EXAM: ABDOMEN - 1 VIEW COMPARISON:  12/20/2017 FINDINGS: 5 mm left midpole calculus unchanged. Left ureteral stent has been placed since prior study and is in good position. No ureteral calculus. No right renal calculi Normal bowel gas pattern IMPRESSION: 5 mm left renal calculus with left ureteral stent. Normal bowel gas pattern. Electronically Signed   By: Marlan Palau M.D.   On: 12/22/2017 08:31   Dg Chest Port 1 View  Result Date: 12/22/2017 CLINICAL DATA:  Nausea and vomiting EXAM: PORTABLE CHEST 1 VIEW COMPARISON:  None. FINDINGS: The heart size and mediastinal contours are within normal limits. Both lungs are clear. The visualized skeletal structures are unremarkable. IMPRESSION: No active disease. Electronically Signed   By: Alcide Clever M.D.   On: 12/22/2017 08:28    Assessment/Plan: 62yo with nephrolithiasis and refractory nausea  1. Patient is s/p ureteroscopy and stent placement. Stent will remain in place until Tuesday 2. Nausea: compazine prn. 3. Hypertension: continue nicardipine drip  LOS: 0 days   Wilkie Aye 12/22/2017, 9:57 PM

## 2017-12-22 NOTE — Consult Note (Signed)
Medical Consultation   Sheryl Bates  ZOX:[id number]  DOB: 07-09-1955  DOA: 12/20/2017  PCP: Marden Noble, MD    Requesting physician:Dr. McKenzie   Reason for consultation: Hypertensive urgency, nausea   History of Present Illness:  Sheryl Bates is an 62 y.o. female with past medical history of hypertension who was admitted with complaints of one-week history of left flank pain and intractable nausea, vomiting.  She was found to have a left ureteral calculus.  Patient was admitted under urology service.  She underwent cystoscopy, retrograde ureteroscopy and left ureteral stent placement.  She also has been started on antibiotics for possible UTI  . Hospitalist team was called today for consult due to blood pressure in the range of 200s. Patient seen and examined the bedside on the floor.  She was in hypertensive urgency.  She also has issues of ongoing nausea. Patient being transferred to stepdown for starting nicardipine drip. She denies any chest pain, shortness of breath, palpitations, abdominal pain, fever, chills, diarrhea or headache.     Past Medical History: Past Medical History:  Diagnosis Date  . Allergy   . CIN I (cervical intraepithelial neoplasia I)   . Endometriosis   . Hyperlipidemia   . Hypertension   . Kidney stone   . Ovarian cyst    right  . Vitamin D deficiency     Past Surgical History: Past Surgical History:  Procedure Laterality Date  . CERVICAL BIOPSY  W/ LOOP ELECTRODE EXCISION    . COLPOSCOPY    . CYSTOSCOPY/RETROGRADE/URETEROSCOPY/STONE EXTRACTION WITH BASKET Left 12/20/2017   Procedure: CYSTOSCOPY/RETROGRADE/URETEROSCOPY/ LEFT URETERAL STENT PLACEMENT;  Surgeon: Malen Gauze, MD;  Location: [facility];  Service: Urology;  Laterality: Left;  . ESOPHAGOGASTRODUODENOSCOPY N/A 03/18/2016   Procedure: ESOPHAGOGASTRODUODENOSCOPY (EGD);  Surgeon: Bernette Redbird, MD;  Location: [facility];  Service: Endoscopy;   Laterality: N/A;  . IR GENERIC HISTORICAL  03/15/2016   IR EPIDUROGRAPHY 03/15/2016 [facility]-INTER[facility]  . KNEE SURGERY     Right  . LAPAROSCOPY  1996  . LEEP  1992  . PELVIC LAPAROSCOPY       Allergies:   Allergies  Allergen Reactions  . Sulfonamide Derivatives Diarrhea and Nausea Only  . Lisinopril Cough     Social History:  reports that she quit smoking about 12 years ago. She has never used smokeless tobacco. She reports that she does not drink alcohol or use drugs.   Family History: Family History  Problem Relation Age of Onset  . Hypertension Mother   . Heart disease Mother   . Diabetes Mother   . Heart disease Father 16       CHF     Physical Exam: Vitals:   12/22/17 0439 12/22/17 0554 12/22/17 0727 12/22/17 1032  BP: (!) 222/93 (!) 223/91 (!) 238/99 (!) 209/80  Pulse: 89 70 74   Resp: 18     Temp: 99.5 F (37.5 C) 99 F (37.2 C)    TempSrc: Oral Oral    SpO2: 98%     Weight:      Height:        General exam: Appears calm and comfortable ,uncomfortable due to nausea,average built HEENT:PERRL,Oral mucosa moist, Ear/Nose normal on gross exam Respiratory system: Bilateral equal air entry, normal vesicular breath sounds, no wheezes or crackles  Cardiovascular system: S1 & S2 heard, RRR. No JVD, murmurs, rubs, gallops or clicks. Gastrointestinal system: Abdomen is  nondistended, soft and nontender. No organomegaly or masses felt. Normal bowel sounds heard. Central nervous system: Alert and oriented. No focal neurological deficits. Extremities: No edema, no clubbing ,no cyanosis, distal peripheral pulses palpable. Skin: No rashes, lesions or ulcers,no icterus ,no pallor MSK: Normal muscle bulk,tone ,power Psychiatry: Judgement and insight appear normal. Mood & affect appropriate.     Data reviewed:  I have personally reviewed following labs and imaging studies Labs:  CBC: Recent Labs  Lab 12/20/17 1046  WBC 7.3  HGB 15.5*  HCT 46.1*  MCV 92.6  PLT 180     Basic Metabolic Panel: Recent Labs  Lab 12/20/17 1046  NA 138  K 3.1*  CL 101  CO2 26  GLUCOSE 142*  BUN 13  CREATININE 0.86  CALCIUM 9.5   GFR Estimated Creatinine Clearance: 71.6 mL/min (by C-G formula based on SCr of 0.86 mg/dL). Liver Function Tests: Recent Labs  Lab 12/20/17 1046  AST 41  ALT 51*  ALKPHOS 66  BILITOT 1.4*  PROT 6.7  ALBUMIN 4.2   Recent Labs  Lab 12/20/17 1046  LIPASE 32   No results for input(s): AMMONIA in the last 168 hours. Coagulation profile No results for input(s): INR, PROTIME in the last 168 hours.  Cardiac Enzymes: No results for input(s): CKTOTAL, CKMB, CKMBINDEX, TROPONINI in the last 168 hours. BNP: Invalid input(s): POCBNP CBG: No results for input(s): GLUCAP in the last 168 hours. D-Dimer No results for input(s): DDIMER in the last 72 hours. Hgb A1c No results for input(s): HGBA1C in the last 72 hours. Lipid Profile No results for input(s): CHOL, HDL, LDLCALC, TRIG, CHOLHDL, LDLDIRECT in the last 72 hours. Thyroid function studies No results for input(s): TSH, T4TOTAL, T3FREE, THYROIDAB in the last 72 hours.  Invalid input(s): FREET3 Anemia work up No results for input(s): VITAMINB12, FOLATE, FERRITIN, TIBC, IRON, RETICCTPCT in the last 72 hours. Urinalysis    Component Value Date/Time   COLORURINE YELLOW 12/20/2017 1355   APPEARANCEUR CLOUDY (A) 12/20/2017 1355   LABSPEC 1.020 12/20/2017 1355   PHURINE 7.0 12/20/2017 1355   GLUCOSEU NEGATIVE 12/20/2017 1355   HGBUR TRACE (A) 12/20/2017 1355   BILIRUBINUR NEGATIVE 12/20/2017 1355   BILIRUBINUR n 07/19/2013 1200   KETONESUR 40 (A) 12/20/2017 1355   PROTEINUR NEGATIVE 12/20/2017 1355   UROBILINOGEN 0.2 07/19/2013 1200   UROBILINOGEN 0.2 12/31/2011 1629   NITRITE NEGATIVE 12/20/2017 1355   LEUKOCYTESUR MODERATE (A) 12/20/2017 1355     Microbiology Recent Results (from the past 240 hour(s))  Urine culture     Status: None   Collection Time: 12/15/17   5:47 AM  Result Value Ref Range Status   Specimen Description   Final    URINE, CLEAN CATCH Performed at [facility], [address] [place], [place] [address]    Special Requests   Final    NONE Performed at [facility], [address] [place], [place] [address]    Culture   Final    NO GROWTH Performed at [facility], [address] [place], [place] [address]    Report Status 12/16/2017 FINAL  Final  Surgical pcr screen     Status: None   Collection Time: 12/20/17  5:22 PM  Result Value Ref Range Status   MRSA, PCR NEGATIVE NEGATIVE Final   Staphylococcus aureus NEGATIVE NEGATIVE Final    Comment: (NOTE) The Xpert SA Assay (FDA approved for NASAL specimens in patients 14 years of age  and older), is one component of a comprehensive surveillance program. It is not intended to diagnose infection nor to guide or monitor treatment. Performed at [facility], [address], [place], [place] [address]        Inpatient Medications:   Scheduled Meds: . aspirin EC  81 mg Oral Daily  . citalopram  20 mg Oral Daily  . hydrochlorothiazide  25 mg Oral Daily  . Influenza vac split quadrivalent PF  0.5 mL Intramuscular Tomorrow-1000  . losartan  100 mg Oral Daily  . metoCLOPramide (REGLAN) injection  5 mg Intravenous Q6H  . metoprolol tartrate  50 mg Oral BID   Continuous Infusions: . cefTRIAXone (ROCEPHIN)  IV 2 g (12/21/17 2238)  . niCARDipine       Radiological Exams on Admission: Dg Abd 1 View  Result Date: 12/22/2017 CLINICAL DATA:  Vomiting.  Kidney stone. EXAM: ABDOMEN - 1 VIEW COMPARISON:  12/20/2017 FINDINGS: 5 mm left midpole calculus unchanged. Left ureteral stent has been placed since prior study and is in good position. No ureteral calculus. No right renal calculi Normal bowel gas pattern IMPRESSION: 5 mm left renal calculus with left ureteral stent. Normal bowel gas pattern. Electronically Signed   By: Marlan Palau M.D.   On: 12/22/2017 08:31   Dg Abdomen 1 View  Result Date: 12/20/2017 CLINICAL DATA:  Known renal stone. Complaining of nausea, vomiting and constipation. EXAM: ABDOMEN - 1 VIEW COMPARISON:  CT, 12/15/2017 FINDINGS: Pole intrarenal calculus on the left is stable from the prior CT. Normal bowel gas pattern.  No significant increase in colonic stool. No other renal stones. No evidence of a ureteral stone. Soft tissues are otherwise unremarkable. No acute skeletal abnormality. IMPRESSION: 1. No acute findings. No evidence of bowel obstruction. No significant increase in colonic stool. 2. Stable left intrarenal stone. Electronically Signed   By: Amie Portland M.D.   On: 12/20/2017 11:29   Dg Chest Port 1 View  Result Date: 12/22/2017 CLINICAL DATA:  Nausea and vomiting EXAM: PORTABLE CHEST 1 VIEW COMPARISON:  None. FINDINGS: The heart size and mediastinal contours are within normal limits. Both lungs are clear. The visualized skeletal structures are unremarkable. IMPRESSION: No active disease. Electronically Signed   By: Alcide Clever M.D.   On: 12/22/2017 08:28   Dg C-arm 1-60 Min-no Report  Result Date: 12/20/2017 Fluoroscopy was utilized by the requesting physician.  No radiographic interpretation.    Impression/Recommendations Principal Problem:   Hypertensive urgency Active Problems:   Ureteral stone   Renal calculus   Nausea   Constipation   Hypertensive urgency: Patient's blood pressure in the range of 200s.  Does not complain of any chest pain, shortness of breath or any other associated symptoms suggesting hypertensive emergency. We will start her on nicardipine drip.  Patient is on hydrochlorthiazide, losartan and metoprolol at home.  Will resume her home medications and make metoprolol tartrate twice a day. We will continue to monitor in stepdown. Will get BMP.  Nausea: Persistent.  On promethazine and Compazine.  Will add Reglan.  Hopefully improvement in the blood  pressure/resolution of constipation will help with nausea also.  Ureteral stone: Management as per urology.  Also on ceftriaxone.  Constipation: Ordered Fleet enema today by urology.  We will also start on MiraLAX  Thank you for this consultation.  Our [facility] hospitalist team will follow the patient with you.   Time Spent:45 mins.Burnadette Pop M.D. [facility] [phone] 12/22/2017, 10:42 AM

## 2017-12-22 NOTE — Progress Notes (Signed)
Patient did well and slept all night with no complaints but woke up this morning with complaints of nausea and vomiting. Blood pressure was also very elevated at 222/93. Gave patient IV Phenergan and waited an hour and rechecked blood pressure. Repeat BP was 223/91. MD on call was notified and new orders received to give patient oral BP medications. Gave patient Metoprolol, Lorsartan, and Cozaar this morning. Will continue to monitor patient.

## 2017-12-22 NOTE — Anesthesia Postprocedure Evaluation (Signed)
Anesthesia Post Note  Patient: Sheryl Bates  Procedure(s) Performed: CYSTOSCOPY/RETROGRADE/URETEROSCOPY/ LEFT URETERAL STENT PLACEMENT (Left Ureter)     Patient location during evaluation: PACU Anesthesia Type: General Level of consciousness: awake Pain management: pain level controlled Vital Signs Assessment: post-procedure vital signs reviewed and stable Respiratory status: spontaneous breathing Cardiovascular status: stable Postop Assessment: no apparent nausea or vomiting Anesthetic complications: no    Last Vitals:  Vitals:   12/22/17 1500 12/22/17 1530  BP: (!) 125/52 (!) 167/76  Pulse: 62 67  Resp: 16 18  Temp:    SpO2: 96% 99%    Last Pain:  Vitals:   12/22/17 1200  TempSrc: Oral  PainSc:                  Sheryl Bates

## 2017-12-23 DIAGNOSIS — Z888 Allergy status to other drugs, medicaments and biological substances status: Secondary | ICD-10-CM | POA: Diagnosis not present

## 2017-12-23 DIAGNOSIS — Z833 Family history of diabetes mellitus: Secondary | ICD-10-CM | POA: Diagnosis not present

## 2017-12-23 DIAGNOSIS — E785 Hyperlipidemia, unspecified: Secondary | ICD-10-CM | POA: Diagnosis present

## 2017-12-23 DIAGNOSIS — Z6833 Body mass index (BMI) 33.0-33.9, adult: Secondary | ICD-10-CM | POA: Diagnosis not present

## 2017-12-23 DIAGNOSIS — K5900 Constipation, unspecified: Secondary | ICD-10-CM | POA: Diagnosis present

## 2017-12-23 DIAGNOSIS — Z87891 Personal history of nicotine dependence: Secondary | ICD-10-CM | POA: Diagnosis not present

## 2017-12-23 DIAGNOSIS — E669 Obesity, unspecified: Secondary | ICD-10-CM | POA: Diagnosis present

## 2017-12-23 DIAGNOSIS — I10 Essential (primary) hypertension: Secondary | ICD-10-CM | POA: Diagnosis present

## 2017-12-23 DIAGNOSIS — I160 Hypertensive urgency: Secondary | ICD-10-CM | POA: Diagnosis not present

## 2017-12-23 DIAGNOSIS — Z8249 Family history of ischemic heart disease and other diseases of the circulatory system: Secondary | ICD-10-CM | POA: Diagnosis not present

## 2017-12-23 DIAGNOSIS — I444 Left anterior fascicular block: Secondary | ICD-10-CM | POA: Diagnosis present

## 2017-12-23 DIAGNOSIS — Z87442 Personal history of urinary calculi: Secondary | ICD-10-CM | POA: Diagnosis not present

## 2017-12-23 DIAGNOSIS — E876 Hypokalemia: Secondary | ICD-10-CM | POA: Diagnosis present

## 2017-12-23 DIAGNOSIS — E871 Hypo-osmolality and hyponatremia: Secondary | ICD-10-CM | POA: Diagnosis not present

## 2017-12-23 DIAGNOSIS — N132 Hydronephrosis with renal and ureteral calculous obstruction: Secondary | ICD-10-CM | POA: Diagnosis present

## 2017-12-23 DIAGNOSIS — N809 Endometriosis, unspecified: Secondary | ICD-10-CM | POA: Diagnosis present

## 2017-12-23 DIAGNOSIS — E559 Vitamin D deficiency, unspecified: Secondary | ICD-10-CM | POA: Diagnosis present

## 2017-12-23 DIAGNOSIS — Z7982 Long term (current) use of aspirin: Secondary | ICD-10-CM | POA: Diagnosis not present

## 2017-12-23 DIAGNOSIS — R112 Nausea with vomiting, unspecified: Secondary | ICD-10-CM | POA: Diagnosis present

## 2017-12-23 DIAGNOSIS — Z882 Allergy status to sulfonamides status: Secondary | ICD-10-CM | POA: Diagnosis not present

## 2017-12-23 LAB — CBC
HCT: 43.7 % (ref 36.0–46.0)
Hemoglobin: 14.5 g/dL (ref 12.0–15.0)
MCH: 31 pg (ref 26.0–34.0)
MCHC: 33.2 g/dL (ref 30.0–36.0)
MCV: 93.4 fL (ref 80.0–100.0)
Platelets: 163 10*3/uL (ref 150–400)
RBC: 4.68 MIL/uL (ref 3.87–5.11)
RDW: 11.9 % (ref 11.5–15.5)
WBC: 7.5 10*3/uL (ref 4.0–10.5)
nRBC: 0 % (ref 0.0–0.2)

## 2017-12-23 LAB — BASIC METABOLIC PANEL
Anion gap: 11 (ref 5–15)
BUN: 11 mg/dL (ref 8–23)
CO2: 27 mmol/L (ref 22–32)
Calcium: 8.7 mg/dL — ABNORMAL LOW (ref 8.9–10.3)
Chloride: 98 mmol/L (ref 98–111)
Creatinine, Ser: 0.71 mg/dL (ref 0.44–1.00)
GFR calc Af Amer: >60 mL/min (ref >60)
GFR calc non Af Amer: >60 mL/min (ref >60)
Glucose, Bld: 114 mg/dL — ABNORMAL HIGH (ref 70–99)
Potassium: 2.9 mmol/L — ABNORMAL LOW (ref 3.5–5.1)
Sodium: 136 mmol/L (ref 135–145)

## 2017-12-23 LAB — MAGNESIUM: Magnesium: 2 mg/dL (ref 1.7–2.4)

## 2017-12-23 MED ORDER — FAMOTIDINE 20 MG PO TABS: 20.0000 mg | ORAL_TABLET | Freq: Two times a day (BID) | ORAL | Status: DC

## 2017-12-23 MED ORDER — HYDRALAZINE HCL 25 MG PO TABS
25.0000 mg | ORAL_TABLET | Freq: Three times a day (TID) | ORAL | Status: DC
  Administered 2017-12-23 – 2017-12-26 (×11): 25 mg via ORAL
  Filled 2017-12-23 (×11): qty 1

## 2017-12-23 MED ORDER — POTASSIUM CHLORIDE CRYS ER 20 MEQ PO TBCR
40.0000 meq | EXTENDED_RELEASE_TABLET | Freq: Once | ORAL | Status: AC
  Administered 2017-12-23: 40 meq via ORAL
  Filled 2017-12-23: qty 2

## 2017-12-23 MED ORDER — POTASSIUM CHLORIDE 10 MEQ/100ML IV SOLN
10.0000 meq | INTRAVENOUS | Status: AC
  Administered 2017-12-23 (×3): 10 meq via INTRAVENOUS
  Filled 2017-12-23 (×3): qty 100

## 2017-12-23 MED ORDER — SENNOSIDES-DOCUSATE SODIUM 8.6-50 MG PO TABS
1.0000 | ORAL_TABLET | Freq: Two times a day (BID) | ORAL | Status: DC
  Administered 2017-12-23: 1 via ORAL
  Filled 2017-12-23 (×7): qty 1

## 2017-12-23 MED FILL — Ondansetron HCl Inj 4 MG/2ML (2 MG/ML): INTRAMUSCULAR | Qty: 2 | Status: AC

## 2017-12-23 NOTE — Progress Notes (Signed)
PROGRESS NOTE    Sheryl Bates  VOZ:[id number] DOB: March 04, 1955 DOA: 12/20/2017 PCP: Marden Noble, MD    Brief Narrative: Sheryl Bates is an 62 y.o. female with past medical history of hypertension who was admitted with complaints of one-week history of left flank pain and intractable nausea, vomiting.  She was found to have a left ureteral calculus.  Patient was admitted under urology service.  She underwent cystoscopy, retrograde ureteroscopy and left ureteral stent placement.  She also has been started on antibiotics for possible UTI  . Hospitalist team was called today for consult due to blood pressure in the range of 200s. Patient seen and examined the bedside on the floor.  She was in hypertensive urgency.  She also has issues of ongoing nausea. Patient being transferred to stepdown for starting nicardipine drip. She denies any chest pain, shortness of breath, palpitations, abdominal pain, fever, chills, diarrhea or headache.    Assessment & Plan:   Principal Problem:   Hypertensive urgency Active Problems:   Ureteral stone   Renal calculus   Nausea   Constipation  Hypertensive Urgency;  On nicardipine Gtt.  Continue with HCTZ, Cozaar, metoprolol.  Will start hydralazine, titrate up as needed.  check EKG./   Nausea;  Continue with PPI.  PRN Phenergan  Left ureteral calculous: S/P stent.  IV ceftriaxone.  Management by urology  Hypokalemia; Replete IV.  Check mg level.   Constipation;  Had small BM, after Enema.  Start laxative.    Obesity;   RN Pressure Injury Documentation:    Malnutrition Type:      Malnutrition Characteristics:      Nutrition Interventions:     Estimated body mass index is 33.47 kg/m as calculated from the following:   Height as of this encounter: 5\' 3"  (1.6 m).   Weight as of this encounter: 85.7 kg.   DVT prophylaxis: SCD Code Status: full code Family Communication: care discussed with patient    Disposition Plan: remain in the ICU for IV nicardipine gtt  Consultants:   Urology primary   Procedures: s/p ureteroscopy and stent placement   Antimicrobials:  Ceftriaxone.    Subjective: Report mild headache. Denies chest pain or dyspnea.  Complaining of nausea. Had small BM after enema  Objective: Vitals:   12/23/17 0445 12/23/17 0500 12/23/17 0515 12/23/17 0530  BP: (!) 130/36 (!) 112/43 139/73 (!) 142/53  Pulse: (!) 59 63 65 (!) 58  Resp: 20 17 17  (!) 21  Temp:      TempSrc:      SpO2: 93% 95% 96% 94%  Weight:      Height:        Intake/Output Summary (Last 24 hours) at 12/23/2017 0748 Last data filed at 12/23/2017 0556 Gross per 24 hour  Intake 1126.41 ml  Output 1850 ml  Net -723.59 ml   Filed Weights   12/20/17 1018 12/22/17 1103  Weight: 90.7 kg 85.7 kg    Examination:  General exam: Appears calm and comfortable  Respiratory system: Clear to auscultation. Respiratory effort normal. Cardiovascular system: S1 & S2 heard, RRR. No JVD, murmurs, rubs, gallops or clicks. No pedal edema. Gastrointestinal system: Abdomen is nondistended, soft and nontender. No organomegaly or masses felt. Normal bowel sounds heard. Central nervous system: Alert and oriented. No focal neurological deficits. Extremities: Symmetric 5 x 5 power. Skin: No rashes, lesions or ulcers Psychiatry: Judgement and insight appear normal. Mood & affect appropriate.     Data Reviewed: I have personally reviewed  following labs and imaging studies  CBC: Recent Labs  Lab 12/20/17 1046 12/23/17 0659  WBC 7.3 7.5  HGB 15.5* 14.5  HCT 46.1* 43.7  MCV 92.6 93.4  PLT 180 163   Basic Metabolic Panel: Recent Labs  Lab 12/20/17 1046  NA 138  K 3.1*  CL 101  CO2 26  GLUCOSE 142*  BUN 13  CREATININE 0.86  CALCIUM 9.5   GFR: Estimated Creatinine Clearance: 69.4 mL/min (by C-G formula based on SCr of 0.86 mg/dL). Liver Function Tests: Recent Labs  Lab 12/20/17 1046  AST 41   ALT 51*  ALKPHOS 66  BILITOT 1.4*  PROT 6.7  ALBUMIN 4.2   Recent Labs  Lab 12/20/17 1046  LIPASE 32   No results for input(s): AMMONIA in the last 168 hours. Coagulation Profile: No results for input(s): INR, PROTIME in the last 168 hours. Cardiac Enzymes: No results for input(s): CKTOTAL, CKMB, CKMBINDEX, TROPONINI in the last 168 hours. BNP (last 3 results) No results for input(s): PROBNP in the last 8760 hours. HbA1C: No results for input(s): HGBA1C in the last 72 hours. CBG: No results for input(s): GLUCAP in the last 168 hours. Lipid Profile: No results for input(s): CHOL, HDL, LDLCALC, TRIG, CHOLHDL, LDLDIRECT in the last 72 hours. Thyroid Function Tests: No results for input(s): TSH, T4TOTAL, FREET4, T3FREE, THYROIDAB in the last 72 hours. Anemia Panel: No results for input(s): VITAMINB12, FOLATE, FERRITIN, TIBC, IRON, RETICCTPCT in the last 72 hours. Sepsis Labs: No results for input(s): PROCALCITON, LATICACIDVEN in the last 168 hours.  Recent Results (from the past 240 hour(s))  Urine culture     Status: None   Collection Time: 12/15/17  5:47 AM  Result Value Ref Range Status   Specimen Description   Final    URINE, CLEAN CATCH Performed at [facility] [place] [facility], [address] [place], [place] [address]    Special Requests   Final    NONE Performed at [facility], [address] [place], [place] [address]    Culture   Final    NO GROWTH Performed at [facility], [address] [place], [place] [address]    Report Status 12/16/2017 FINAL  Final  Surgical pcr screen     Status: None   Collection Time: 12/20/17  5:22 PM  Result Value Ref Range Status   MRSA, PCR NEGATIVE NEGATIVE Final   Staphylococcus aureus NEGATIVE NEGATIVE Final    Comment: (NOTE) The Xpert SA Assay (FDA approved for NASAL specimens in patients 63 years of age and older), is one component of a comprehensive surveillance program. It is not intended to  diagnose infection nor to guide or monitor treatment. Performed at [facility], [address] [place], [place] [address]          Radiology Studies: Dg Abd 1 View  Result Date: 12/22/2017 CLINICAL DATA:  Vomiting.  Kidney stone. EXAM: ABDOMEN - 1 VIEW COMPARISON:  12/20/2017 FINDINGS: 5 mm left midpole calculus unchanged. Left ureteral stent has been placed since prior study and is in good position. No ureteral calculus. No right renal calculi Normal bowel gas pattern IMPRESSION: 5 mm left renal calculus with left ureteral stent. Normal bowel gas pattern. Electronically Signed   By: Marlan Palauharles  Clark M.D.   On: 12/22/2017 08:31   Dg Chest Port 1 View  Result Date: 12/22/2017 CLINICAL DATA:  Nausea and vomiting EXAM: PORTABLE CHEST 1 VIEW COMPARISON:  None. FINDINGS: The  heart size and mediastinal contours are within normal limits. Both lungs are clear. The visualized skeletal structures are unremarkable. IMPRESSION: No active disease. Electronically Signed   By: Alcide Clever M.D.   On: 12/22/2017 08:28        Scheduled Meds: . aspirin EC  81 mg Oral Daily  . citalopram  20 mg Oral Daily  . hydrALAZINE  25 mg Oral Q8H  . hydrochlorothiazide  25 mg Oral Daily  . Influenza vac split quadrivalent PF  0.5 mL Intramuscular Tomorrow-1000  . losartan  100 mg Oral Daily  . metoCLOPramide (REGLAN) injection  5 mg Intravenous Q6H  . metoprolol tartrate  50 mg Oral BID  . pantoprazole  40 mg Oral Daily  . polyethylene glycol  17 g Oral Daily   Continuous Infusions: . sodium chloride 10 mL/hr at 12/23/17 0556  . cefTRIAXone (ROCEPHIN)  IV Stopped (12/22/17 2219)  . niCARDipine 2.5 mg/hr (12/23/17 0556)     LOS: 0 days    Time spent: 35 minutes.     Alba Cory, MD [facility] Pager [phone]  If 7PM-7AM, please contact night-coverage [email or web address] Password [facility] 12/23/2017, 7:48 AM

## 2017-12-23 NOTE — Progress Notes (Signed)
3 Days Post-Op Subjective: Patient reports improvement in her nausea this morning. No vomiting. Pt get hydralazine for hypertension. She is still on a nacardipine drip  Objective: Vital signs in last 24 hours: Temp:  [98.6 F (37 C)-99.3 F (37.4 C)] 99.3 F (37.4 C) (11/12 2000) Pulse Rate:  [50-92] 72 (11/12 1830) Resp:  [10-24] 11 (11/12 1830) BP: (108-202)/(33-92) 152/50 (11/12 1830) SpO2:  [90 %-96 %] 96 % (11/12 1830)  Intake/Output from previous day: 11/11 0701 - 11/12 0700 In: 1126.4 [P.O.:240; I.V.:786.4; IV Piggyback:100] Out: 1850 [Urine:1850] Intake/Output this shift: No intake/output data recorded.  Physical Exam:  General:alert, cooperative and appears stated age GI: soft, non tender, normal bowel sounds, no palpable masses, no organomegaly, no inguinal hernia Female genitalia: not done Extremities: extremities normal, atraumatic, no cyanosis or edema  Lab Results: Recent Labs    12/23/17 0659  HGB 14.5  HCT 43.7   BMET Recent Labs    12/23/17 0659  NA 136  K 2.9*  CL 98  CO2 27  GLUCOSE 114*  BUN 11  CREATININE 0.71  CALCIUM 8.7*   No results for input(s): LABPT, INR in the last 72 hours. No results for input(s): LABURIN in the last 72 hours. Results for orders placed or performed during the hospital encounter of 12/20/17  Surgical pcr screen     Status: None   Collection Time: 12/20/17  5:22 PM  Result Value Ref Range Status   MRSA, PCR NEGATIVE NEGATIVE Final   Staphylococcus aureus NEGATIVE NEGATIVE Final    Comment: (NOTE) The Xpert SA Assay (FDA approved for NASAL specimens in patients 63 years of age and older), is one component of a comprehensive surveillance program. It is not intended to diagnose infection nor to guide or monitor treatment. Performed at [facility], [address] [place], [place] [address]     Studies/Results: Dg Abd 1 View  Result Date: 12/22/2017 CLINICAL DATA:  Vomiting.  Kidney  stone. EXAM: ABDOMEN - 1 VIEW COMPARISON:  12/20/2017 FINDINGS: 5 mm left midpole calculus unchanged. Left ureteral stent has been placed since prior study and is in good position. No ureteral calculus. No right renal calculi Normal bowel gas pattern IMPRESSION: 5 mm left renal calculus with left ureteral stent. Normal bowel gas pattern. Electronically Signed   By: Marlan Palauharles  Clark M.D.   On: 12/22/2017 08:31   Dg Chest Port 1 View  Result Date: 12/22/2017 CLINICAL DATA:  Nausea and vomiting EXAM: PORTABLE CHEST 1 VIEW COMPARISON:  None. FINDINGS: The heart size and mediastinal contours are within normal limits. Both lungs are clear. The visualized skeletal structures are unremarkable. IMPRESSION: No active disease. Electronically Signed   By: Alcide CleverMark  Lukens M.D.   On: 12/22/2017 08:28    Assessment/Plan: 62yo with nephrolithiasis and refractory nausea  1. Patient is s/p ureteroscopy and stent placement. Stent will remain in place until wednesday 2. Nausea: phenergan prn. 3. Hypertension: continue nicardipine drip  LOS: 0 days   Wilkie Ayeatrick Daryan Buell 12/23/2017, 9:27 PM

## 2017-12-24 DIAGNOSIS — N200 Calculus of kidney: Secondary | ICD-10-CM

## 2017-12-24 DIAGNOSIS — N201 Calculus of ureter: Secondary | ICD-10-CM

## 2017-12-24 LAB — CBC
HCT: 42.9 % (ref 36.0–46.0)
Hemoglobin: 14.1 g/dL (ref 12.0–15.0)
MCH: 30.9 pg (ref 26.0–34.0)
MCHC: 32.9 g/dL (ref 30.0–36.0)
MCV: 93.9 fL (ref 80.0–100.0)
Platelets: 151 10*3/uL (ref 150–400)
RBC: 4.57 MIL/uL (ref 3.87–5.11)
RDW: 11.9 % (ref 11.5–15.5)
WBC: 8 10*3/uL (ref 4.0–10.5)
nRBC: 0 % (ref 0.0–0.2)

## 2017-12-24 LAB — URINE CULTURE: Culture: NO GROWTH

## 2017-12-24 LAB — BASIC METABOLIC PANEL
Anion gap: 11 (ref 5–15)
BUN: 12 mg/dL (ref 8–23)
CO2: 22 mmol/L (ref 22–32)
Calcium: 8.4 mg/dL — ABNORMAL LOW (ref 8.9–10.3)
Chloride: 100 mmol/L (ref 98–111)
Creatinine, Ser: 0.82 mg/dL (ref 0.44–1.00)
GFR calc Af Amer: >60 mL/min (ref >60)
GFR calc non Af Amer: >60 mL/min (ref >60)
Glucose, Bld: 148 mg/dL — ABNORMAL HIGH (ref 70–99)
Potassium: 3.5 mmol/L (ref 3.5–5.1)
Sodium: 133 mmol/L — ABNORMAL LOW (ref 135–145)

## 2017-12-24 MED ORDER — ACETAMINOPHEN 325 MG PO TABS
650.0000 mg | ORAL_TABLET | Freq: Four times a day (QID) | ORAL | Status: DC | PRN
  Administered 2017-12-24 – 2017-12-25 (×3): 650 mg via ORAL
  Filled 2017-12-24 (×3): qty 2

## 2017-12-24 NOTE — Progress Notes (Signed)
Ureteral stent removed after consulting with Dr. Ronne BinningMckenzie.

## 2017-12-24 NOTE — Progress Notes (Signed)
4 Days Post-Op Subjective: Patient reports improvement in her nausea this morning. No vomiting. Hypertension managed over the past 24 hours with improvement in her nausea when SBP are less then 140 Objective: Vital signs in last 24 hours: Temp:  [99.3 F (37.4 C)-102.6 F (39.2 C)] 102.6 F (39.2 C) (11/13 1200) Pulse Rate:  [57-95] 67 (11/13 1300) Resp:  [11-27] 16 (11/13 1300) BP: (105-176)/(34-77) 134/51 (11/13 1300) SpO2:  [91 %-96 %] 94 % (11/13 1300)  Intake/Output from previous day: 11/12 0701 - 11/13 0700 In: 1120.3 [P.O.:240; I.V.:598; IV Piggyback:282.4] Out: 600 [Urine:600] Intake/Output this shift: Total I/O In: 912.7 [I.V.:812.7; IV Piggyback:100] Out: -   Physical Exam:  General:alert, cooperative and appears stated age GI: soft, non tender, normal bowel sounds, no palpable masses, no organomegaly, no inguinal hernia Female genitalia: not done Extremities: extremities normal, atraumatic, no cyanosis or edema  Lab Results: Recent Labs    12/23/17 0659 12/24/17 0316  HGB 14.5 14.1  HCT 43.7 42.9   BMET Recent Labs    12/23/17 0659 12/24/17 0316  NA 136 133*  K 2.9* 3.5  CL 98 100  CO2 27 22  GLUCOSE 114* 148*  BUN 11 12  CREATININE 0.71 0.82  CALCIUM 8.7* 8.4*   No results for input(s): LABPT, INR in the last 72 hours. No results for input(s): LABURIN in the last 72 hours. Results for orders placed or performed during the hospital encounter of 12/20/17  Surgical pcr screen     Status: None   Collection Time: 12/20/17  5:22 PM  Result Value Ref Range Status   MRSA, PCR NEGATIVE NEGATIVE Final   Staphylococcus aureus NEGATIVE NEGATIVE Final    Comment: (NOTE) The Xpert SA Assay (FDA approved for NASAL specimens in patients 922 years of age and older), is one component of a comprehensive surveillance program. It is not intended to diagnose infection nor to guide or monitor treatment. Performed at [facility], [address] [place], [place] [address]   Urine Culture     Status: None   Collection Time: 12/23/17  1:00 PM  Result Value Ref Range Status   Specimen Description   Final    URINE, RANDOM Performed at [facility], [address] [place], [place] [address]    Special Requests   Final    NONE Performed at [facility], [address] [place], [place] [address]    Culture   Final    NO GROWTH Performed at [facility], [address] [place], [place] [address]    Report Status 12/24/2017 FINAL  Final    Studies/Results: No results found.  Assessment/Plan: 62yo with nephrolithiasis and refractory nausea  1. Patient is s/p ureteroscopy and stent placement. Stent will be removed today 2. Nausea: phenergan prn. 3. Hypertension: continue nicardipine drip  LOS: 1 day   Wilkie Ayeatrick Isidra Mings 12/24/2017, 3:21 PM

## 2017-12-24 NOTE — Progress Notes (Signed)
PROGRESS NOTE    Sheryl Bates  ZOX:[id number] DOB: 25-Feb-1955 DOA: 12/20/2017 PCP: Marden Noble, MD    Brief Narrative:  Sheryl Bates is an 62 y.o. female with past medical history of hypertension who was admitted with complaints of one-week history of left flank pain and intractable nausea, vomiting. She was found to have a left ureteral calculus.  Patient was admitted under urology service.  She underwent cystoscopy, retrograde ureteroscopy and left ureteral stent placement.  She also has been started on antibiotics for possible UTI. Hospitalist team consulted due to blood pressure in the range of 200s. Patient was transferred to stepdown for nicardipine drip.   Assessment & Plan:   Principal Problem:   Hypertensive urgency Active Problems:   Ureteral stone   Renal calculus   Nausea   Constipation  Hypertensive Urgency Resolved, BP now soft Wean off nicardipine Gtt.  Continue with HCTZ, Cozaar, metoprolol, hydralazine Monitor closely, may stop hydralazine if BP remains soft  Left ureteral calculous s/p cystoscopy, retrograde ureteroscopy and left ureteral stent placement, with stent removal 12/24/17 Febrile today with no leukocytosis BC X 2 pending UC with no growth IV ceftriaxone Management by urology  Mild hyponatremia Daily BMP  Constipation;  Had small BM, after Enema.  Start laxative.    Obesity;   RN Pressure Injury Documentation:    Malnutrition Type:      Malnutrition Characteristics:      Nutrition Interventions:     Estimated body mass index is 33.47 kg/m as calculated from the following:   Height as of this encounter: 5\' 3"  (1.6 m).   Weight as of this encounter: 85.7 kg.   DVT prophylaxis: SCD Code Status: full code Family Communication: None at bedside Disposition Plan: As per urology  Consultants:   Urology primary   Procedures: s/p ureteroscopy and stent placement   Antimicrobials:  Ceftriaxone.     Subjective: Denies any new complaints. Denies chest pain, SOB, abdominal pain  Objective: Vitals:   12/24/17 1330 12/24/17 1536 12/24/17 1600 12/24/17 1800  BP: (!) 119/56 (!) 110/38  (!) 106/54  Pulse: 62 (!) 58  (!) 56  Resp: 19 (!) 22  10  Temp:   99.5 F (37.5 C)   TempSrc:   Oral   SpO2: 94% 96%  97%  Weight:      Height:        Intake/Output Summary (Last 24 hours) at 12/24/2017 1930 Last data filed at 12/24/2017 1159 Gross per 24 hour  Intake 912.65 ml  Output -  Net 912.65 ml   Filed Weights   12/20/17 1018 12/22/17 1103  Weight: 90.7 kg 85.7 kg    Examination:  General: NAD   Cardiovascular: S1, S2 present  Respiratory: CTAB  Abdomen: Soft, nontender, nondistended, bowel sounds present  Musculoskeletal: No bilateral pedal edema noted  Skin: Normal  Psychiatry: Normal mood    Data Reviewed: I have personally reviewed following labs and imaging studies  CBC: Recent Labs  Lab 12/20/17 1046 12/23/17 0659 12/24/17 0316  WBC 7.3 7.5 8.0  HGB 15.5* 14.5 14.1  HCT 46.1* 43.7 42.9  MCV 92.6 93.4 93.9  PLT 180 163 151   Basic Metabolic Panel: Recent Labs  Lab 12/20/17 1046 12/23/17 0659 12/24/17 0316  NA 138 136 133*  K 3.1* 2.9* 3.5  CL 101 98 100  CO2 26 27 22   GLUCOSE 142* 114* 148*  BUN 13 11 12   CREATININE 0.86 0.71 0.82  CALCIUM 9.5 8.7* 8.4*  MG  --  2.0  --    GFR: Estimated Creatinine Clearance: 72.8 mL/min (by C-G formula based on SCr of 0.82 mg/dL). Liver Function Tests: Recent Labs  Lab 12/20/17 1046  AST 41  ALT 51*  ALKPHOS 66  BILITOT 1.4*  PROT 6.7  ALBUMIN 4.2   Recent Labs  Lab 12/20/17 1046  LIPASE 32   No results for input(s): AMMONIA in the last 168 hours. Coagulation Profile: No results for input(s): INR, PROTIME in the last 168 hours. Cardiac Enzymes: No results for input(s): CKTOTAL, CKMB, CKMBINDEX, TROPONINI in the last 168 hours. BNP (last 3 results) No results for input(s): PROBNP in  the last 8760 hours. HbA1C: No results for input(s): HGBA1C in the last 72 hours. CBG: No results for input(s): GLUCAP in the last 168 hours. Lipid Profile: No results for input(s): CHOL, HDL, LDLCALC, TRIG, CHOLHDL, LDLDIRECT in the last 72 hours. Thyroid Function Tests: No results for input(s): TSH, T4TOTAL, FREET4, T3FREE, THYROIDAB in the last 72 hours. Anemia Panel: No results for input(s): VITAMINB12, FOLATE, FERRITIN, TIBC, IRON, RETICCTPCT in the last 72 hours. Sepsis Labs: No results for input(s): PROCALCITON, LATICACIDVEN in the last 168 hours.  Recent Results (from the past 240 hour(s))  Urine culture     Status: None   Collection Time: 12/15/17  5:47 AM  Result Value Ref Range Status   Specimen Description   Final    URINE, CLEAN CATCH Performed at [facility], [address] [place], [place] [address]    Special Requests   Final    NONE Performed at [facility], [address] [place], [place] [address]    Culture   Final    NO GROWTH Performed at [facility], [address], [place], [place] [address]    Report Status 12/16/2017 FINAL  Final  Surgical pcr screen     Status: None   Collection Time: 12/20/17  5:22 PM  Result Value Ref Range Status   MRSA, PCR NEGATIVE NEGATIVE Final   Staphylococcus aureus NEGATIVE NEGATIVE Final    Comment: (NOTE) The Xpert SA Assay (FDA approved for NASAL specimens in patients 39 years of age and older), is one component of a comprehensive surveillance program. It is not intended to diagnose infection nor to guide or monitor treatment. Performed at [facility], [address], [place], [place] [address]   Urine Culture     Status: None   Collection Time: 12/23/17  1:00 PM  Result Value Ref Range Status   Specimen Description   Final    URINE, RANDOM Performed at [facility], [address], [place], [place] [address]    Special Requests   Final     NONE Performed at [facility], [address], [place], [place] [address]    Culture   Final    NO GROWTH Performed at [facility], [address] [place], [place] [address]    Report Status 12/24/2017 FINAL  Final         Radiology Studies: No results found.      Scheduled Meds: . aspirin EC  81 mg Oral Daily  . citalopram  20 mg Oral Daily  . hydrALAZINE  25 mg Oral Q8H  . hydrochlorothiazide  25 mg Oral Daily  . Influenza vac split quadrivalent PF  0.5 mL Intramuscular Tomorrow-1000  . losartan  100 mg Oral Daily  . metoCLOPramide (  REGLAN) injection  5 mg Intravenous Q6H  . metoprolol tartrate  50 mg Oral BID  . pantoprazole  40 mg Oral Daily  . polyethylene glycol  17 g Oral Daily  . senna-docusate  1 tablet Oral BID   Continuous Infusions: . sodium chloride Stopped (12/24/17 1157)  . cefTRIAXone (ROCEPHIN)  IV Stopped (12/23/17 2205)  . niCARDipine Stopped (12/24/17 1153)     LOS: 1 day    Time spent: 35 minutes.     Briant CedarNkeiruka J Ezenduka, MD [facility]   If 7PM-7AM, please contact night-coverage [email or web address] 12/24/2017, 7:30 PM

## 2017-12-25 ENCOUNTER — Inpatient Hospital Stay (HOSPITAL_COMMUNITY): Payer: BLUE CROSS/BLUE SHIELD

## 2017-12-25 LAB — BASIC METABOLIC PANEL
Anion gap: 10 (ref 5–15)
BUN: 10 mg/dL (ref 8–23)
CO2: 25 mmol/L (ref 22–32)
Calcium: 8.3 mg/dL — ABNORMAL LOW (ref 8.9–10.3)
Chloride: 99 mmol/L (ref 98–111)
Creatinine, Ser: 0.9 mg/dL (ref 0.44–1.00)
GFR calc Af Amer: >60 mL/min (ref >60)
GFR calc non Af Amer: >60 mL/min (ref >60)
Glucose, Bld: 126 mg/dL — ABNORMAL HIGH (ref 70–99)
Potassium: 3.4 mmol/L — ABNORMAL LOW (ref 3.5–5.1)
Sodium: 134 mmol/L — ABNORMAL LOW (ref 135–145)

## 2017-12-25 MED ORDER — ENOXAPARIN SODIUM 40 MG/0.4ML ~~LOC~~ SOLN
40.0000 mg | SUBCUTANEOUS | Status: DC
  Administered 2017-12-25: 40 mg via SUBCUTANEOUS
  Filled 2017-12-25: qty 0.4

## 2017-12-25 MED ORDER — SODIUM CHLORIDE 0.9 % IV SOLN: 2.0000 g | INTRAVENOUS | Status: DC

## 2017-12-25 NOTE — Progress Notes (Signed)
Gave report to RN on 5W

## 2017-12-25 NOTE — Progress Notes (Addendum)
PROGRESS NOTE    Sheryl Bates  XBJ:[id number] DOB: 08/20/1955 DOA: 12/20/2017 PCP: Marden Noble, MD    Brief Narrative:  Sheryl Bates is an 62 y.o. female with past medical history of hypertension who was admitted with complaints of one-week history of left flank pain and intractable nausea, vomiting. She was found to have a left ureteral calculus.  Patient was admitted under urology service.  She underwent cystoscopy, retrograde ureteroscopy and left ureteral stent placement.  She also has been started on antibiotics for possible UTI. Hospitalist team consulted due to blood pressure in the range of 200s. Patient was transferred to stepdown for nicardipine drip.   Assessment & Plan:   Principal Problem:   Hypertensive urgency Active Problems:   Ureteral stone   Renal calculus   Nausea   Constipation  Hypertensive Urgency Resolved, BP now stable Weaned off nicardipine gtt on 12/24/17 Continue with HCTZ, Cozaar, metoprolol, hydralazine  Left ureteral calculous s/p cystoscopy, retrograde ureteroscopy and left ureteral stent placement, with stent removal 12/24/17 Continues to spike fever since 12/24/17, no leukocytosis BC X 2 NGTD UC with no growth, repeat UC pending collection CXR unremarkable Continue IV ceftriaxone Consider re-imaging if fever persists Management by urology  Mild hyponatremia Daily BMP  Constipation;  Had small BM, after Enema.  Start laxative.    Obesity;   RN Pressure Injury Documentation:    Malnutrition Type:      Malnutrition Characteristics:      Nutrition Interventions:     Estimated body mass index is 33.47 kg/m as calculated from the following:   Height as of this encounter: 5\' 3"  (1.6 m).   Weight as of this encounter: 85.7 kg.   DVT prophylaxis: Lovenox Code Status: full code Family Communication: None at bedside Disposition Plan: As per urology  Consultants:   Urology primary   Procedures: s/p  ureteroscopy and stent placement   Antimicrobials:  Ceftriaxone.    Subjective: Denies any new complaints.  Objective: Vitals:   12/25/17 1000 12/25/17 1100 12/25/17 1200 12/25/17 1300  BP: (!) 148/56 (!) 141/55 (!) 149/54 (!) 138/52  Pulse: 65 61 61 64  Resp: (!) 24 (!) 24 (!) 23 (!) 21  Temp:   (!) 101.7 F (38.7 C)   TempSrc:   Oral   SpO2: 95% 96% 96% 95%  Weight:      Height:        Intake/Output Summary (Last 24 hours) at 12/25/2017 1702 Last data filed at 12/25/2017 0400 Gross per 24 hour  Intake 340 ml  Output -  Net 340 ml   Filed Weights   12/20/17 1018 12/22/17 1103  Weight: 90.7 kg 85.7 kg    Examination:  General: NAD   Cardiovascular: S1, S2 present  Respiratory: CTAB  Abdomen: Soft, nontender, nondistended, bowel sounds present  Musculoskeletal: No bilateral pedal edema noted  Skin: Normal  Psychiatry: Normal mood   Data Reviewed: I have personally reviewed following labs and imaging studies  CBC: Recent Labs  Lab 12/20/17 1046 12/23/17 0659 12/24/17 0316  WBC 7.3 7.5 8.0  HGB 15.5* 14.5 14.1  HCT 46.1* 43.7 42.9  MCV 92.6 93.4 93.9  PLT 180 163 151   Basic Metabolic Panel: Recent Labs  Lab 12/20/17 1046 12/23/17 0659 12/24/17 0316 12/25/17 0328  NA 138 136 133* 134*  K 3.1* 2.9* 3.5 3.4*  CL 101 98 100 99  CO2 26 27 22 25   GLUCOSE 142* 114* 148* 126*  BUN 13 11 12  10  CREATININE 0.86 0.71 0.82 0.90  CALCIUM 9.5 8.7* 8.4* 8.3*  MG  --  2.0  --   --    GFR: Estimated Creatinine Clearance: 66.4 mL/min (by C-G formula based on SCr of 0.9 mg/dL). Liver Function Tests: Recent Labs  Lab 12/20/17 1046  AST 41  ALT 51*  ALKPHOS 66  BILITOT 1.4*  PROT 6.7  ALBUMIN 4.2   Recent Labs  Lab 12/20/17 1046  LIPASE 32   No results for input(s): AMMONIA in the last 168 hours. Coagulation Profile: No results for input(s): INR, PROTIME in the last 168 hours. Cardiac Enzymes: No results for input(s): CKTOTAL, CKMB,  CKMBINDEX, TROPONINI in the last 168 hours. BNP (last 3 results) No results for input(s): PROBNP in the last 8760 hours. HbA1C: No results for input(s): HGBA1C in the last 72 hours. CBG: No results for input(s): GLUCAP in the last 168 hours. Lipid Profile: No results for input(s): CHOL, HDL, LDLCALC, TRIG, CHOLHDL, LDLDIRECT in the last 72 hours. Thyroid Function Tests: No results for input(s): TSH, T4TOTAL, FREET4, T3FREE, THYROIDAB in the last 72 hours. Anemia Panel: No results for input(s): VITAMINB12, FOLATE, FERRITIN, TIBC, IRON, RETICCTPCT in the last 72 hours. Sepsis Labs: No results for input(s): PROCALCITON, LATICACIDVEN in the last 168 hours.  Recent Results (from the past 240 hour(s))  Surgical pcr screen     Status: None   Collection Time: 12/20/17  5:22 PM  Result Value Ref Range Status   MRSA, PCR NEGATIVE NEGATIVE Final   Staphylococcus aureus NEGATIVE NEGATIVE Final    Comment: (NOTE) The Xpert SA Assay (FDA approved for NASAL specimens in patients 54 years of age and older), is one component of a comprehensive surveillance program. It is not intended to diagnose infection nor to guide or monitor treatment. Performed at [facility], [address], [place], [place] [address]   Urine Culture     Status: None   Collection Time: 12/23/17  1:00 PM  Result Value Ref Range Status   Specimen Description   Final    URINE, RANDOM Performed at [facility], [address], [place], [place] [address]    Special Requests   Final    NONE Performed at [facility], [address], [place], [place] [address]    Culture   Final    NO GROWTH Performed at [facility], [address], [place], [place] [address]    Report Status 12/24/2017 FINAL  Final  Culture, blood (routine x 2)     Status: None (Preliminary result)   Collection Time: 12/24/17  1:57 PM  Result Value Ref Range Status   Specimen Description    Final    BLOOD LEFT HAND Performed at [facility], [address], [place], [place] [address]    Special Requests   Final    BOTTLES DRAWN AEROBIC AND ANAEROBIC Blood Culture adequate volume Performed at [facility], [address], [place], [place] [address]    Culture   Final    NO GROWTH < 12 HOURS Performed at [facility], [address], [place], [place] [address]    Report Status PENDING  Incomplete  Culture, blood (routine x 2)     Status: None (Preliminary result)   Collection Time: 12/24/17  1:57 PM  Result Value Ref Range Status   Specimen Description   Final    BLOOD RIGHT HAND Performed at [facility],  [address] [place], [place] [address]    Special Requests   Final    BOTTLES DRAWN AEROBIC ONLY Blood Culture adequate volume Performed at [facility], [address] [place], [place] [address]    Culture   Final    NO GROWTH < 12 HOURS Performed at [facility], [address] [place], [place] [address]    Report Status PENDING  Incomplete         Radiology Studies: Dg Chest Port 1 View  Result Date: 12/25/2017 CLINICAL DATA:  Fever. EXAM: PORTABLE CHEST 1 VIEW COMPARISON:  12/22/2017. FINDINGS: Mediastinum and hilar structures normal. Lungs are clear. No pleural effusion or pneumothorax. Cardiomegaly with normal pulmonary vascularity. No acute bony abnormality IMPRESSION: 1.  Cardiomegaly.  No pulmonary venous congestion. 2.  No focal pulmonary infiltrate. Electronically Signed   By: Maisie Fushomas  Register   On: 12/25/2017 08:02        Scheduled Meds: . aspirin EC  81 mg Oral Daily  . citalopram  20 mg Oral Daily  . hydrALAZINE  25 mg Oral Q8H  . hydrochlorothiazide  25 mg Oral Daily  . Influenza vac split quadrivalent PF  0.5 mL Intramuscular Tomorrow-1000  . losartan  100 mg Oral Daily  . metoCLOPramide (REGLAN) injection  5 mg Intravenous Q6H  .  metoprolol tartrate  50 mg Oral BID  . pantoprazole  40 mg Oral Daily  . polyethylene glycol  17 g Oral Daily  . senna-docusate  1 tablet Oral BID   Continuous Infusions: . sodium chloride Stopped (12/24/17 1157)  . cefTRIAXone (ROCEPHIN)  IV Stopped (12/24/17 2148)     LOS: 2 days       Briant CedarNkeiruka J , MD [facility]   If 7PM-7AM, please contact night-coverage [email or web address] 12/25/2017, 5:02 PM

## 2017-12-25 NOTE — Progress Notes (Signed)
5 Days Post-Op Subjective: Patient reports improvement in her nausea this morning. No vomiting. Hypertension managed over the past 24 hours with improvement in her nausea when SBP are less then 140. Pt had a fever today and last night after stent removal Objective: Vital signs in last 24 hours: Temp:  [98.3 F (36.8 C)-101.7 F (38.7 C)] 101.7 F (38.7 C) (11/14 1200) Pulse Rate:  [50-76] 64 (11/14 1300) Resp:  [7-26] 21 (11/14 1300) BP: (106-164)/(47-78) 138/52 (11/14 1300) SpO2:  [93 %-98 %] 95 % (11/14 1300)  Intake/Output from previous day: 11/13 0701 - 11/14 0700 In: 1252.7 [P.O.:240; I.V.:812.7; IV Piggyback:200] Out: -  Intake/Output this shift: No intake/output data recorded.  Physical Exam:  General:alert, cooperative and appears stated age GI: soft, non tender, normal bowel sounds, no palpable masses, no organomegaly, no inguinal hernia Female genitalia: not done Extremities: extremities normal, atraumatic, no cyanosis or edema  Lab Results: Recent Labs    12/23/17 0659 12/24/17 0316  HGB 14.5 14.1  HCT 43.7 42.9   BMET Recent Labs    12/24/17 0316 12/25/17 0328  NA 133* 134*  K 3.5 3.4*  CL 100 99  CO2 22 25  GLUCOSE 148* 126*  BUN 12 10  CREATININE 0.82 0.90  CALCIUM 8.4* 8.3*   No results for input(s): LABPT, INR in the last 72 hours. No results for input(s): LABURIN in the last 72 hours. Results for orders placed or performed during the hospital encounter of 12/20/17  Surgical pcr screen     Status: None   Collection Time: 12/20/17  5:22 PM  Result Value Ref Range Status   MRSA, PCR NEGATIVE NEGATIVE Final   Staphylococcus aureus NEGATIVE NEGATIVE Final    Comment: (NOTE) The Xpert SA Assay (FDA approved for NASAL specimens in patients 63 years of age and older), is one component of a comprehensive surveillance program. It is not intended to diagnose infection nor to guide or monitor treatment. Performed at [facility],  [address] [place], [place] [address]   Urine Culture     Status: None   Collection Time: 12/23/17  1:00 PM  Result Value Ref Range Status   Specimen Description   Final    URINE, RANDOM Performed at [facility], [address] [place], [place] [address]    Special Requests   Final    NONE Performed at [facility], [address] [place], [place] [address]    Culture   Final    NO GROWTH Performed at [facility], [address] [place], [place] [address]    Report Status 12/24/2017 FINAL  Final  Culture, blood (routine x 2)     Status: None (Preliminary result)   Collection Time: 12/24/17  1:57 PM  Result Value Ref Range Status   Specimen Description   Final    BLOOD LEFT HAND Performed at [facility], [address] [place], [place] [address]    Special Requests   Final    BOTTLES DRAWN AEROBIC AND ANAEROBIC Blood Culture adequate volume Performed at [facility], [address] [place], [place] [address]    Culture   Final    NO GROWTH < 12 HOURS Performed at [facility], [address] [place], [place] [address]    Report Status PENDING  Incomplete  Culture, blood (routine x 2)     Status: None (Preliminary result)   Collection Time: 12/24/17  1:57 PM  Result Value Ref Range Status   Specimen Description   Final    BLOOD RIGHT HAND Performed at [facility], [address] [place], [place] [address]    Special Requests   Final    BOTTLES DRAWN AEROBIC ONLY Blood Culture adequate volume Performed at [facility], [address] [place], [place] [address]    Culture   Final    NO GROWTH < 12 HOURS Performed at [facility], [address], [place], [place] [address]    Report Status PENDING  Incomplete    Studies/Results: Dg Chest Port 1 View  Result Date: 12/25/2017 CLINICAL DATA:  Fever. EXAM: PORTABLE CHEST  1 VIEW COMPARISON:  12/22/2017. FINDINGS: Mediastinum and hilar structures normal. Lungs are clear. No pleural effusion or pneumothorax. Cardiomegaly with normal pulmonary vascularity. No acute bony abnormality IMPRESSION: 1.  Cardiomegaly.  No pulmonary venous congestion. 2.  No focal pulmonary infiltrate. Electronically Signed   By: Maisie Fus  Register   On: 12/25/2017 08:02    Assessment/Plan: 62YN with nephrolithiasis and refractory nausea  1. Patient is s/p ureteroscopy and stent placement. Urine for culture. We will start rocephin pending culture 2. Nausea: phenergan prn. 3. Hypertension: continue management per hospitalist  LOS: 2 days   Wilkie Aye 12/25/2017, 4:57 PM

## 2017-12-25 NOTE — Care Management Note (Signed)
Case Management Note  Patient Details  Name: Sheryl Bates MRN: [id number] Date of Birth: 09/19/1955  Subjective/Objective:                  hypertensive urgency  Action/Plan: Following for progression of care. Following for cm needs none present at this time.  Expected Discharge Date:  12/22/17               Expected Discharge Plan:  Home/Self Care  In-House Referral:     Discharge planning Services  CM Consult  Post Acute Care Choice:    Choice offered to:     DME Arranged:    DME Agency:     HH Arranged:    HH Agency:     Status of Service:  In process, will continue to follow  If discussed at Long Length of Stay Meetings, dates discussed:    Additional Comments:  Golda AcreDavis, Wayde Gopaul Lynn, RN 12/25/2017, 10:26 AM

## 2017-12-25 NOTE — Op Note (Signed)
.  Preoperative diagnosis: Left ureteral stone  Postoperative diagnosis: Same  Procedure: 1 cystoscopy 2. Left retrograde pyelography 3.  Intraoperative fluoroscopy, under one hour, with interpretation 4.  Left Diagnostic ureteroscopy 5.  Left 6 x 24 JJ stent placement  Attending: Cleda MccreedyPatrick Mackenzie  Anesthesia: General  Estimated blood loss: None  Drains: Left 6 x 24 JJ ureteral stent with tether  Specimens: none  Antibiotics: ancef  Findings: left distal ureteral stone. Moderate hydronephrosis. No masses/lesions in the bladder. Ureteral orifices in normal anatomic location.  Indications: Patient is a 62 year old female with a history of left distal ureteral stone and who has persistent nausea/vomiting.  After discussing treatment options, she decided proceed with left ureteroscopic stone manipulation.  Procedure her in detail: The patient was brought to the operating room and a brief timeout was done to ensure correct patient, correct procedure, correct site.  General anesthesia was administered patient was placed in dorsal lithotomy position.  Her genitalia was then prepped and draped in usual sterile fashion.  A rigid 22 French cystoscope was passed in the urethra and the bladder.  Bladder was inspected free masses or lesions.  the ureteral orifices were in the normal orthotopic locations.  a 6 french ureteral catheter was then instilled into the left ureteral orifice.  a gentle retrograde was obtained and findings noted above.  we then placed a zip wire through the ureteral catheter and advanced up to the renal pelvis.  we then removed the cystoscope and cannulated the left ureteral orifice with a semirigid ureteroscope.  Multiple tiny stone fragments were identified in the distal ureter. The were easily flushed into the bladder. Once we reached the UPJ a sensor wire was advanced in to the renal pelvis. We then removed the ureteroscope and advanced a flexible ureteroscope over the sensor  wire. We encountered no stones in the kidney. We then removed the scope and  we then placed a 6 x 26 double-j ureteral stent over the original zip wire. We then removed the wire and good coil was noted in the the renal pelvis under fluoroscopy and the bladder under direct vision. the bladder was then drained and this concluded the procedure which was well tolerated by patient.  Complications: None  Condition: Stable, extubated, transferred to PACU  Plan: Patient is to be admitted for nausea control. Her stent is to be removed in 72 hours.

## 2017-12-26 LAB — CBC WITH DIFFERENTIAL/PLATELET
Abs Immature Granulocytes: 0.05 10*3/uL (ref 0.00–0.07)
Basophils Absolute: 0 10*3/uL (ref 0.0–0.1)
Basophils Relative: 0 %
Eosinophils Absolute: 0.1 10*3/uL (ref 0.0–0.5)
Eosinophils Relative: 1 %
HCT: 41.4 % (ref 36.0–46.0)
Hemoglobin: 13.7 g/dL (ref 12.0–15.0)
Immature Granulocytes: 1 %
Lymphocytes Relative: 4 %
Lymphs Abs: 0.2 10*3/uL — ABNORMAL LOW (ref 0.7–4.0)
MCH: 31.2 pg (ref 26.0–34.0)
MCHC: 33.1 g/dL (ref 30.0–36.0)
MCV: 94.3 fL (ref 80.0–100.0)
Monocytes Absolute: 0.3 10*3/uL (ref 0.1–1.0)
Monocytes Relative: 5 %
Neutro Abs: 5.7 10*3/uL (ref 1.7–7.7)
Neutrophils Relative %: 89 %
Platelets: 118 10*3/uL — ABNORMAL LOW (ref 150–400)
RBC: 4.39 MIL/uL (ref 3.87–5.11)
RDW: 11.9 % (ref 11.5–15.5)
WBC: 6.3 10*3/uL (ref 4.0–10.5)
nRBC: 0 % (ref 0.0–0.2)

## 2017-12-26 LAB — BASIC METABOLIC PANEL
Anion gap: 11 (ref 5–15)
BUN: 8 mg/dL (ref 8–23)
CO2: 25 mmol/L (ref 22–32)
Calcium: 8.5 mg/dL — ABNORMAL LOW (ref 8.9–10.3)
Chloride: 98 mmol/L (ref 98–111)
Creatinine, Ser: 0.69 mg/dL (ref 0.44–1.00)
GFR calc Af Amer: >60 mL/min (ref >60)
GFR calc non Af Amer: >60 mL/min (ref >60)
Glucose, Bld: 108 mg/dL — ABNORMAL HIGH (ref 70–99)
Potassium: 3.1 mmol/L — ABNORMAL LOW (ref 3.5–5.1)
Sodium: 134 mmol/L — ABNORMAL LOW (ref 135–145)

## 2017-12-26 MED ORDER — PROMETHAZINE HCL 25 MG/ML IJ SOLN: 25.0000 mg | Freq: Four times a day (QID) | INTRAMUSCULAR | Status: DC | PRN

## 2017-12-26 MED ORDER — HYDRALAZINE HCL 25 MG PO TABS: 25.0000 mg | ORAL_TABLET | Freq: Three times a day (TID) | ORAL | 1 refills | Status: DC

## 2017-12-26 MED ORDER — CEFPODOXIME PROXETIL 200 MG PO TABS: 200.0000 mg | ORAL_TABLET | Freq: Two times a day (BID) | ORAL | 0 refills | Status: DC

## 2017-12-26 MED ORDER — LOSARTAN POTASSIUM 100 MG PO TABS: 100.0000 mg | ORAL_TABLET | Freq: Every day | ORAL | 1 refills | Status: AC

## 2017-12-26 MED ORDER — POTASSIUM CHLORIDE CRYS ER 20 MEQ PO TBCR
40.0000 meq | EXTENDED_RELEASE_TABLET | Freq: Once | ORAL | Status: AC
  Administered 2017-12-26: 40 meq via ORAL
  Filled 2017-12-26: qty 2

## 2017-12-26 MED ORDER — METOPROLOL TARTRATE 50 MG PO TABS: 50.0000 mg | ORAL_TABLET | Freq: Two times a day (BID) | ORAL | 1 refills | Status: DC

## 2017-12-26 MED ORDER — PROMETHAZINE HCL 12.5 MG PO TABS: 12.5000 mg | ORAL_TABLET | Freq: Four times a day (QID) | ORAL | 0 refills | Status: DC | PRN

## 2017-12-26 NOTE — Progress Notes (Signed)
PROGRESS NOTE    Sheryl GeninRae Michele Bates  AOZ:[id number] DOB: 05/08/1955 DOA: 12/20/2017 PCP: Marden NobleGates, Robert, MD    Brief Narrative:  Sheryl Bates is an 62 y.o. female with past medical history of hypertension who was admitted with complaints of one-week history of left flank pain and intractable nausea, vomiting. She was found to have a left ureteral calculus.  Patient was admitted under urology service.  She underwent cystoscopy, retrograde ureteroscopy and left ureteral stent placement.  She also has been started on antibiotics for possible UTI. Hospitalist team consulted due to blood pressure in the range of 200s. Patient was transferred to stepdown for nicardipine drip.   Assessment & Plan:   Principal Problem:   Hypertensive urgency Active Problems:   Ureteral stone   Renal calculus   Nausea   Constipation  Hypertensive Urgency Resolved, BP now stable Weaned off nicardipine gtt on 12/24/17 Continue home Cozaar, metoprolol, started on hydralazine Follow up with PCP  Left ureteral calculous s/p cystoscopy, retrograde ureteroscopy and left ureteral stent placement, with stent removal 12/24/17 Last temp spike was on 12/25/17, no leukocytosis BC X 2 NGTD UC with no growth, repeat UC pending CXR unremarkable S/P IV ceftriaxone, discharge AB per urology (discharged on cefpodoxime) Follow up with urology  Mild hyponatremia Follow up with PCP   Hypokalemia Replaced prn Follow up with PCP with repeat labs  Obesity Lifestyle modification advised  RN Pressure Injury Documentation:    Malnutrition Type:      Malnutrition Characteristics:      Nutrition Interventions:     Estimated body mass index is 33.47 kg/m as calculated from the following:   Height as of this encounter: 5\' 3"  (1.6 m).   Weight as of this encounter: 85.7 kg.   DVT prophylaxis: Lovenox Code Status: full code Family Communication: None at bedside Disposition Plan: As per  urology  Consultants:   Urology primary   Procedures: s/p ureteroscopy and stent placement   Antimicrobials:  Ceftriaxone.    Subjective: Denies any new complaints.  Objective: Vitals:   12/25/17 1733 12/25/17 1837 12/26/17 0424 12/26/17 1424  BP: (!) 107/50 (!) 104/52 140/61 135/66  Pulse: (!) 59 (!) 57 66 70  Resp: 17 16 18 18   Temp:  98.1 F (36.7 C) 99.6 F (37.6 C) 98.8 F (37.1 C)  TempSrc:  Oral Oral Oral  SpO2: 96% 92% 95% 98%  Weight:      Height:        Intake/Output Summary (Last 24 hours) at 12/26/2017 1805 Last data filed at 12/26/2017 1424 Gross per 24 hour  Intake 880 ml  Output 1300 ml  Net -420 ml   Filed Weights   12/20/17 1018 12/22/17 1103  Weight: 90.7 kg 85.7 kg    Examination:  General: NAD   Cardiovascular: S1, S2 present  Respiratory: CTAB  Abdomen: Soft, nontender, nondistended, bowel sounds present  Musculoskeletal: No bilateral pedal edema noted  Skin: Normal  Psychiatry: Normal mood   Data Reviewed: I have personally reviewed following labs and imaging studies  CBC: Recent Labs  Lab 12/20/17 1046 12/23/17 0659 12/24/17 0316 12/26/17 0413  WBC 7.3 7.5 8.0 6.3  NEUTROABS  --   --   --  5.7  HGB 15.5* 14.5 14.1 13.7  HCT 46.1* 43.7 42.9 41.4  MCV 92.6 93.4 93.9 94.3  PLT 180 163 151 118*   Basic Metabolic Panel: Recent Labs  Lab 12/20/17 1046 12/23/17 0659 12/24/17 0316 12/25/17 0328 12/26/17 0413  NA 138  136 133* 134* 134*  K 3.1* 2.9* 3.5 3.4* 3.1*  CL 101 98 100 99 98  CO2 26 27 22 25 25   GLUCOSE 142* 114* 148* 126* 108*  BUN 13 11 12 10 8   CREATININE 0.86 0.71 0.82 0.90 0.69  CALCIUM 9.5 8.7* 8.4* 8.3* 8.5*  MG  --  2.0  --   --   --    GFR: Estimated Creatinine Clearance: 74.7 mL/min (by C-G formula based on SCr of 0.69 mg/dL). Liver Function Tests: Recent Labs  Lab 12/20/17 1046  AST 41  ALT 51*  ALKPHOS 66  BILITOT 1.4*  PROT 6.7  ALBUMIN 4.2   Recent Labs  Lab  12/20/17 1046  LIPASE 32   No results for input(s): AMMONIA in the last 168 hours. Coagulation Profile: No results for input(s): INR, PROTIME in the last 168 hours. Cardiac Enzymes: No results for input(s): CKTOTAL, CKMB, CKMBINDEX, TROPONINI in the last 168 hours. BNP (last 3 results) No results for input(s): PROBNP in the last 8760 hours. HbA1C: No results for input(s): HGBA1C in the last 72 hours. CBG: No results for input(s): GLUCAP in the last 168 hours. Lipid Profile: No results for input(s): CHOL, HDL, LDLCALC, TRIG, CHOLHDL, LDLDIRECT in the last 72 hours. Thyroid Function Tests: No results for input(s): TSH, T4TOTAL, FREET4, T3FREE, THYROIDAB in the last 72 hours. Anemia Panel: No results for input(s): VITAMINB12, FOLATE, FERRITIN, TIBC, IRON, RETICCTPCT in the last 72 hours. Sepsis Labs: No results for input(s): PROCALCITON, LATICACIDVEN in the last 168 hours.  Recent Results (from the past 240 hour(s))  Surgical pcr screen     Status: None   Collection Time: 12/20/17  5:22 PM  Result Value Ref Range Status   MRSA, PCR NEGATIVE NEGATIVE Final   Staphylococcus aureus NEGATIVE NEGATIVE Final    Comment: (NOTE) The Xpert SA Assay (FDA approved for NASAL specimens in patients 76 years of age and older), is one component of a comprehensive surveillance program. It is not intended to diagnose infection nor to guide or monitor treatment. Performed at [facility], [address], [place], [place] [address]   Urine Culture     Status: None   Collection Time: 12/23/17  1:00 PM  Result Value Ref Range Status   Specimen Description   Final    URINE, RANDOM Performed at [facility], [address] [place], [place] [address]    Special Requests   Final    NONE Performed at [facility] [place], [address], [place], [place] [address]    Culture   Final    NO GROWTH Performed at [facility], [address], [place], [place] [address]    Report Status 12/24/2017 FINAL  Final  Culture, blood (routine x 2)     Status: None (Preliminary result)   Collection Time: 12/24/17  1:57 PM  Result Value Ref Range Status   Specimen Description   Final    BLOOD LEFT HAND Performed at [facility], [address] [place], [place] [address]    Special Requests   Final    BOTTLES DRAWN AEROBIC AND ANAEROBIC Blood Culture adequate volume Performed at [facility], [address] [place], [place] [address]    Culture   Final    NO GROWTH 2 DAYS Performed at [facility], [address] [place], [place] [address]    Report Status PENDING  Incomplete  Culture, blood (  routine x 2)     Status: None (Preliminary result)   Collection Time: 12/24/17  1:57 PM  Result Value Ref Range Status   Specimen Description   Final    BLOOD RIGHT HAND Performed at [facility], [address] [place], [place] [address]    Special Requests   Final    BOTTLES DRAWN AEROBIC ONLY Blood Culture adequate volume Performed at [facility], [address], [place], [place] [address]    Culture   Final    NO GROWTH 2 DAYS Performed at [facility], [address], [place], [place] [address]    Report Status PENDING  Incomplete         Radiology Studies: Dg Chest Port 1 View  Result Date: 12/25/2017 CLINICAL DATA:  Fever. EXAM: PORTABLE CHEST 1 VIEW COMPARISON:  12/22/2017. FINDINGS: Mediastinum and hilar structures normal. Lungs are clear. No pleural effusion or pneumothorax. Cardiomegaly with normal pulmonary vascularity. No acute bony abnormality IMPRESSION: 1.  Cardiomegaly.  No pulmonary venous congestion. 2.  No focal pulmonary infiltrate. Electronically Signed   By: Maisie Fus  Register   On: 12/25/2017 08:02        Scheduled Meds: . aspirin EC  81 mg Oral Daily  . citalopram  20 mg Oral Daily  . enoxaparin (LOVENOX)  injection  40 mg Subcutaneous Q24H  . hydrALAZINE  25 mg Oral Q8H  . hydrochlorothiazide  25 mg Oral Daily  . Influenza vac split quadrivalent PF  0.5 mL Intramuscular Tomorrow-1000  . losartan  100 mg Oral Daily  . metoCLOPramide (REGLAN) injection  5 mg Intravenous Q6H  . metoprolol tartrate  50 mg Oral BID  . pantoprazole  40 mg Oral Daily  . polyethylene glycol  17 g Oral Daily  . senna-docusate  1 tablet Oral BID   Continuous Infusions: . sodium chloride Stopped (12/24/17 1157)  . cefTRIAXone (ROCEPHIN)  IV Stopped (12/26/17 0700)     LOS: 3 days       Briant Cedar, MD [facility]   If 7PM-7AM, please contact night-coverage [email or web address] 12/26/2017, 6:05 PM

## 2017-12-26 NOTE — Progress Notes (Signed)
Discharge instructions discussed with patient and family, verbalized agreement and understanding 

## 2017-12-26 NOTE — Progress Notes (Signed)
PT Cancellation Note  Patient Details Name: Lia HoppingRae Michele Shur MRN: [id number] DOB: 03/25/1955   Cancelled Treatment:    Reason Eval/Treat Not Completed: PT screened, no needs identified, will sign off . Patient up ad lib.   Rada HayHill, Aja Whitehair Elizabeth 12/26/2017, 12:29 PM Blanchard KelchKaren Jolly Bleicher PT Acute Rehabilitation Services Pager [phone] Office [phone]

## 2017-12-27 LAB — URINE CULTURE: Culture: NO GROWTH

## 2017-12-29 LAB — CULTURE, BLOOD (ROUTINE X 2)
Culture: NO GROWTH
Culture: NO GROWTH
Special Requests: ADEQUATE
Special Requests: ADEQUATE

## 2018-01-01 NOTE — Discharge Summary (Signed)
Physician Discharge Summary  Patient ID: Sheryl Bates MRN: [id number] DOB/AGE: 63/07/1955 63 y.o.  Admit date: 12/20/2017 Discharge date: 12/26/2017  Admission Diagnoses: Ureteral stone  Discharge Diagnoses:  Principal Problem:   Hypertensive urgency Active Problems:   Ureteral stone   Renal calculus   Nausea   Constipation   Discharged Condition: good  Hospital Course: The patient tolerated the procedure well and was transferred to the floor on IV pain meds, IV fluid. On POD#1 foley was removed, pt was started on clear liquid diet and they ambulated in the halls.  On POD#2 the patient was found to be in hypertensive crisis and was transferred to the ICU. She was started on a nicardipine drip and on POD #4 was transitioned to hyralazine. Her stent was removed on POD#4. Prior to discharge the pt was tolerating a regular diet, pain was controlled on PO pain meds, they were ambulating without difficulty, and they had normal bowel function.  Consults: pulmonary/intensive care  Significant Diagnostic Studies: none  Treatments: surgery: ureteroscopy with stent placement  Discharge Exam: Blood pressure 135/66, pulse 70, temperature 98.8 F (37.1 C), temperature source Oral, resp. rate 18, height 5\' 3"  (1.6 m), weight 85.7 kg, SpO2 98 %. General appearance: alert, cooperative and appears stated age Eyes: conjunctivae/corneas clear. PERRL, EOM's intact. Fundi benign. Nose: Nares normal. Septum midline. Mucosa normal. No drainage or sinus tenderness. Resp: clear to auscultation bilaterally Cardio: regular rate and rhythm, S1, S2 normal, no murmur, click, rub or gallop GI: soft, non-tender; bowel sounds normal; no masses,  no organomegaly Extremities: extremities normal, atraumatic, no cyanosis or edema Neurologic: Grossly normal  Disposition: Discharge disposition: 01-Home or Self Care       Discharge Instructions    Discharge patient   Complete by:  As directed    Discharge disposition:  01-Home or Self Care   Discharge patient date:  12/26/2017     Allergies as of 12/26/2017      Reactions   Sulfonamide Derivatives Diarrhea, Nausea Only   Lisinopril Cough      Medication List    STOP taking these medications   cephALEXin 500 MG capsule Commonly known as:  KEFLEX   hydrochlorothiazide 25 MG tablet Commonly known as:  HYDRODIURIL   ibuprofen 600 MG tablet Commonly known as:  ADVIL,MOTRIN     TAKE these medications   aspirin EC 81 MG tablet Take 81 mg by mouth daily.   cefpodoxime 200 MG tablet Commonly known as:  VANTIN Take 1 tablet (200 mg total) by mouth 2 (two) times daily.   citalopram 20 MG tablet Commonly known as:  CELEXA Take 1 tablet (20 mg total) by mouth daily.   hydrALAZINE 25 MG tablet Commonly known as:  APRESOLINE Take 1 tablet (25 mg total) by mouth 3 (three) times daily.   losartan 100 MG tablet Commonly known as:  COZAAR Take 1 tablet (100 mg total) by mouth daily.   metoprolol tartrate 50 MG tablet Commonly known as:  LOPRESSOR Take 1 tablet (50 mg total) by mouth 2 (two) times daily. What changed:  when to take this   promethazine 12.5 MG tablet Commonly known as:  PHENERGAN Take 1 tablet (12.5 mg total) by mouth every 6 (six) hours as needed for nausea or vomiting.   VITAMIN D PO Take 2 capsules by mouth daily with breakfast.        Signed: Wilkie AyePatrick Raygan Skarda 01/01/2018, 4:45 PM

## 2018-04-15 ENCOUNTER — Other Ambulatory Visit: Payer: Self-pay | Admitting: Internal Medicine

## 2018-04-15 DIAGNOSIS — R112 Nausea with vomiting, unspecified: Secondary | ICD-10-CM

## 2018-04-16 ENCOUNTER — Ambulatory Visit
Admission: RE | Admit: 2018-04-16 | Discharge: 2018-04-16 | Disposition: A | Discharge status: Home / Self Care | Payer: BLUE CROSS/BLUE SHIELD | Source: Ambulatory Visit | Attending: Internal Medicine | Admitting: Internal Medicine

## 2018-04-16 DIAGNOSIS — R112 Nausea with vomiting, unspecified: Secondary | ICD-10-CM

## 2018-10-09 ENCOUNTER — Other Ambulatory Visit: Payer: Self-pay | Admitting: Obstetrics & Gynecology

## 2018-10-09 DIAGNOSIS — Z1231 Encounter for screening mammogram for malignant neoplasm of breast: Secondary | ICD-10-CM

## 2018-11-25 ENCOUNTER — Ambulatory Visit
Admission: RE | Admit: 2018-11-25 | Discharge: 2018-11-25 | Disposition: A | Discharge status: Home / Self Care | Payer: BC Managed Care – PPO | Source: Ambulatory Visit | Attending: Obstetrics & Gynecology | Admitting: Obstetrics & Gynecology

## 2018-11-25 ENCOUNTER — Other Ambulatory Visit: Payer: Self-pay

## 2018-11-25 DIAGNOSIS — Z1231 Encounter for screening mammogram for malignant neoplasm of breast: Secondary | ICD-10-CM

## 2019-11-08 IMAGING — RF DG UGI W/ HIGH DENSITY W/O KUB
12 of 13 series · 14 of 24 positions shown · non-contrast
Comparison: CT abdomen 12/15/2017

CLINICAL DATA: Nausea and vomiting.

EXAM:
UPPER GI SERIES WITH KUB
TECHNIQUE: After obtaining a scout radiograph a routine upper GI series was
performed using thin and high density barium.
FLUOROSCOPY TIME:  Fluoroscopy Time:  2 minutes and 18 seconds
Radiation Exposure Index (if provided by the fluoroscopic device):
158 mGy
Number of Acquired Spot Images: 9

[Series 1: one shot · 0.14mm/px · 1 of 1 slices shown (1 of 5)]
[im 1/1]
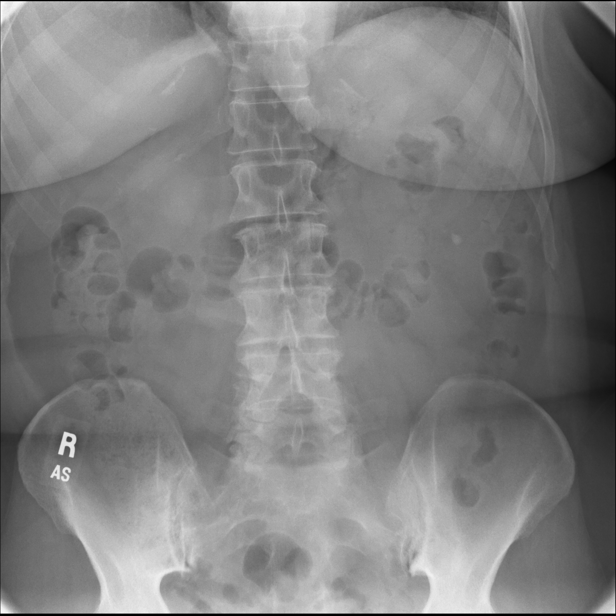

[Series 2: sequence · 1 of 13 frames shown (1 of 7)]
[frame 7/13]
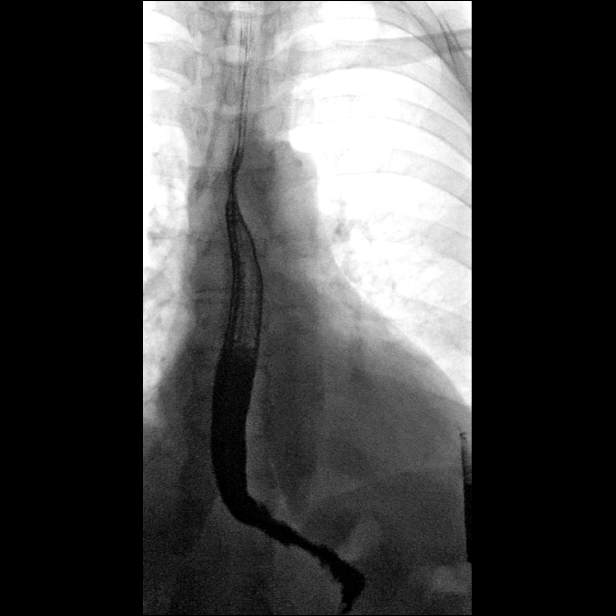

[Series 3: one shot · 0.15mm/px · 1 of 4 slices shown (2 of 5)]
[im 3/4]
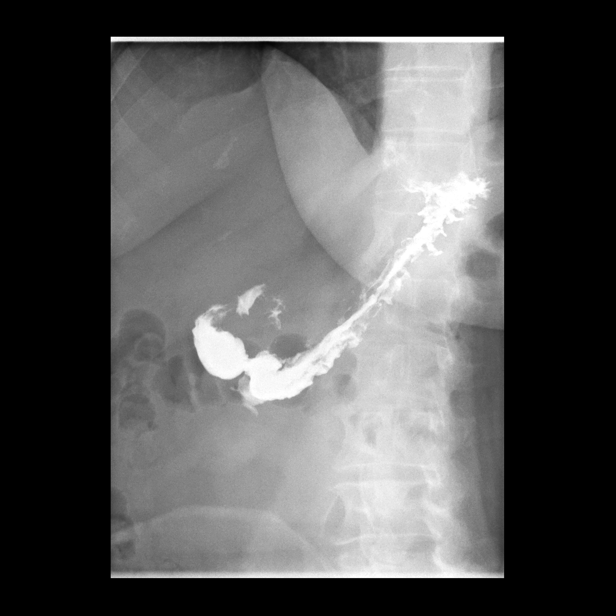

[Series 4: sequence · 0.29mm/px · 2 of 9 frames shown (2 of 7)]
[frame 3/9]
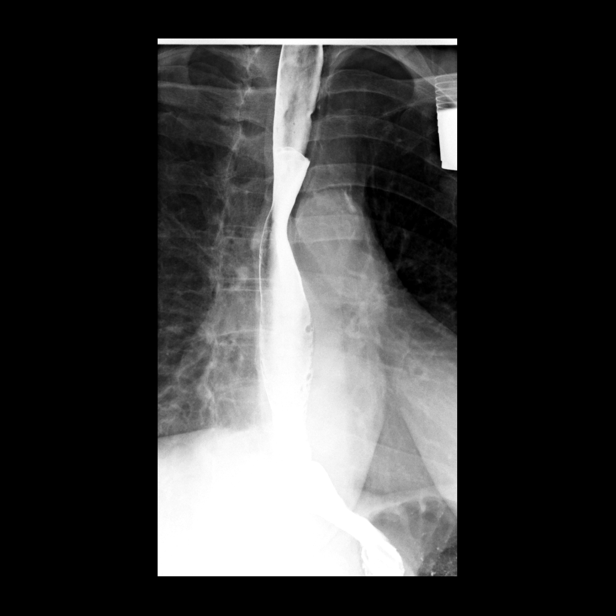
[frame 8/9]
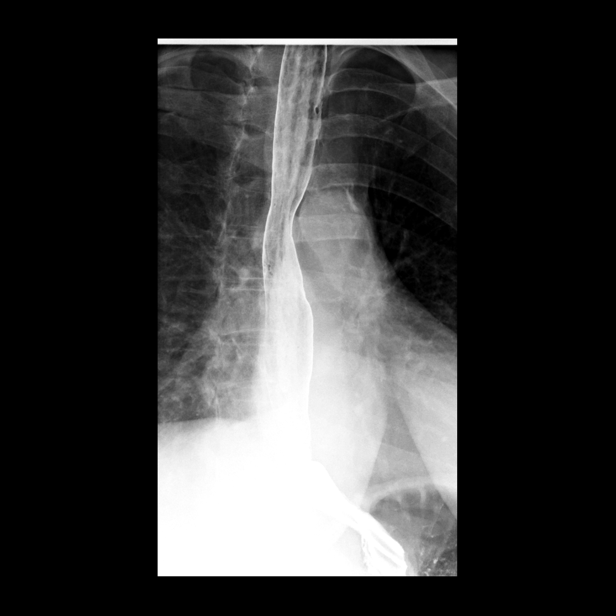

[Series 5: sequence · 0.29mm/px · 1 of 12 frames shown (3 of 7)]
[frame 11/12]
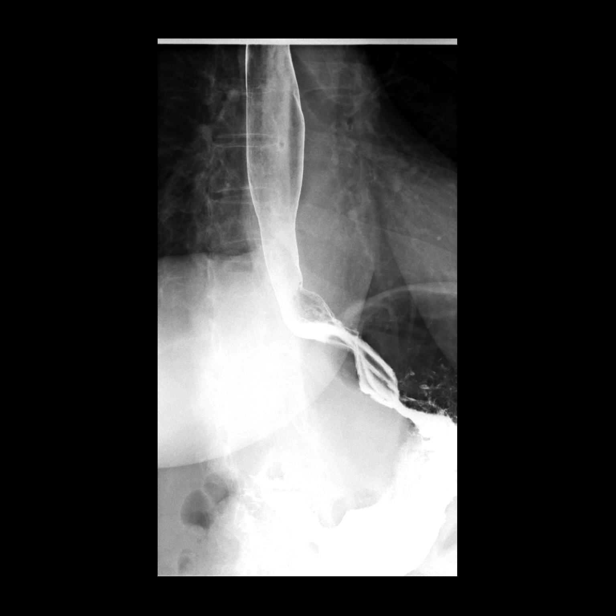

[Series 8: one shot · 0.15mm/px · 2 of 2 slices shown (3 of 5)]
[im 1/2]
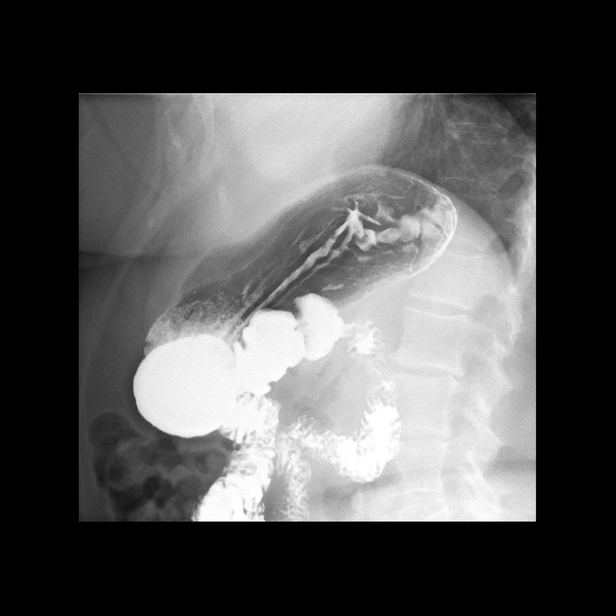
[im 2/2]
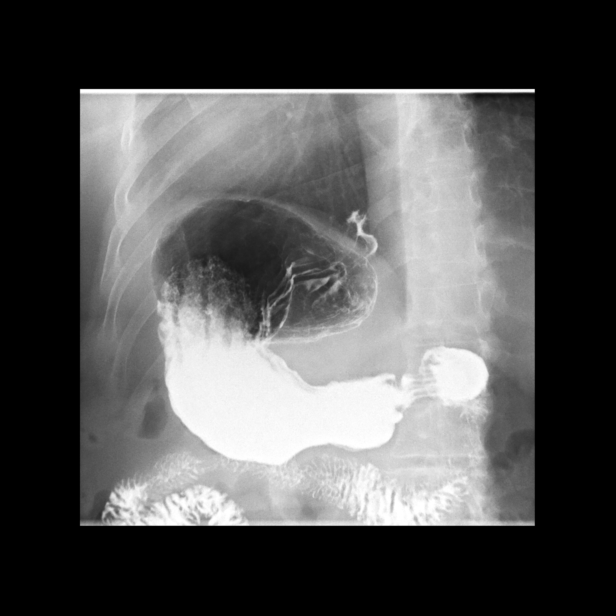

[Series 9: sequence · 1 of 16 frames shown (4 of 7)]
[frame 16/16]
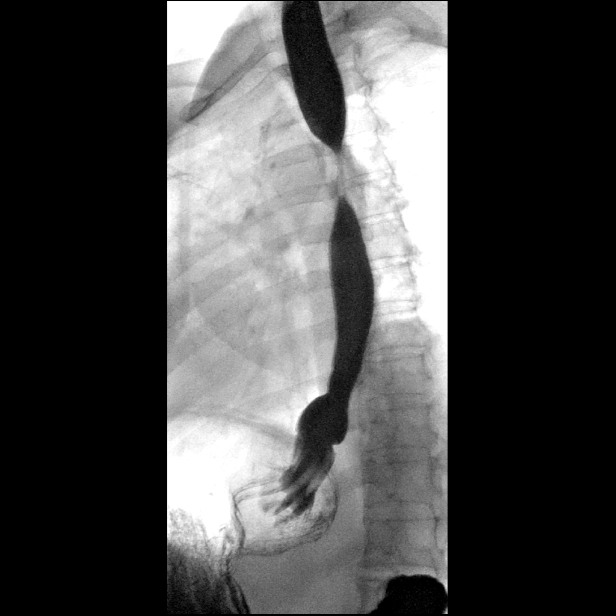

[Series 10: sequence · 1 of 14 frames shown (5 of 7)]
[frame 12/14]
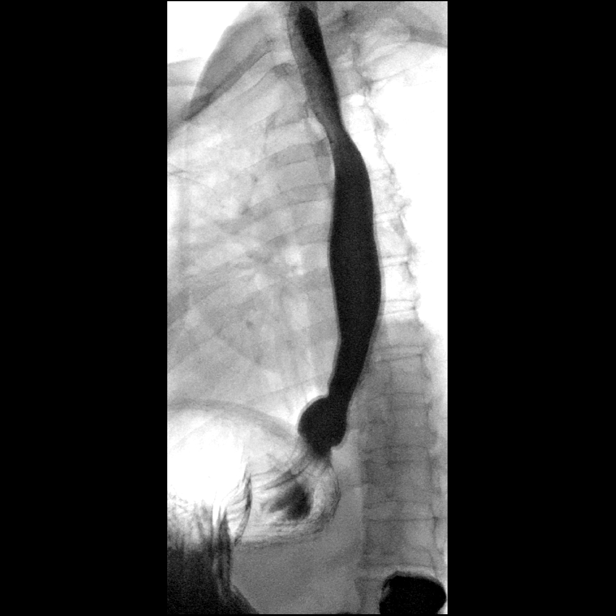

[Series 11: sequence · 1 of 10 frames shown (6 of 7)]
[frame 10/10]
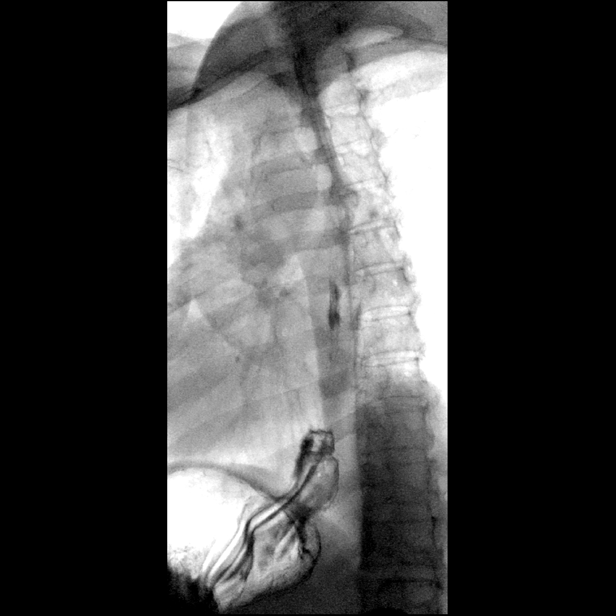

[Series 12: one shot · 1 of 3 slices shown (4 of 5)]
[im 1/3]
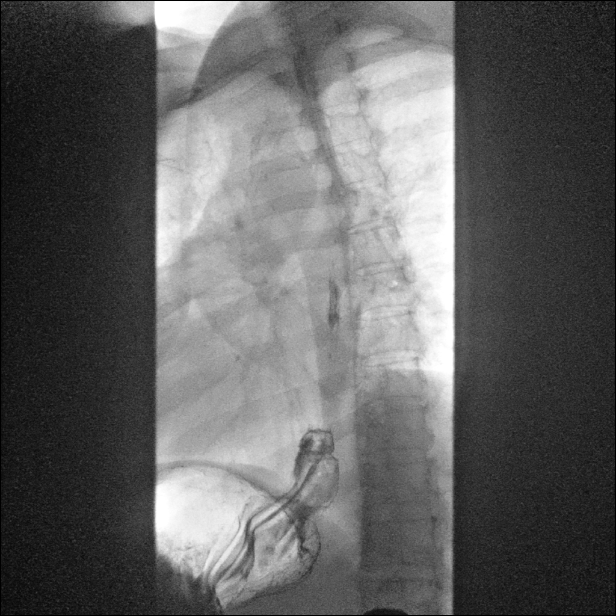

[Series 13: sequence · 1 of 11 frames shown (7 of 7)]
[frame 6/11]
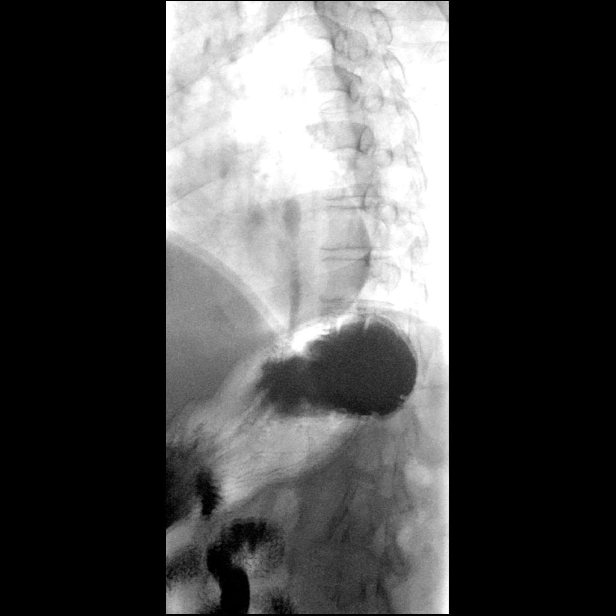

[Series 14: one shot · 1 of 1 slices shown (5 of 5)]
[im 1/1]
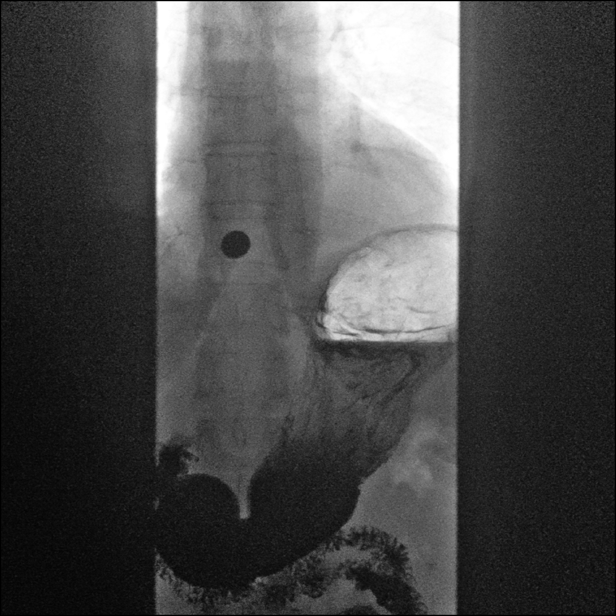

[14 of 24 positions shown; findings below may reference images not displayed]

FINDINGS: Initial barium swallows demonstrate normal esophageal motility. No
intrinsic or extrinsic lesions of the esophagus were identified and
no mucosal abnormalities are seen.

There is a small sliding-type hiatal hernia and a lower esophageal
mucosal B ring (Schatzki's ring). The 13 mm barium pill did hang up
here for a short while but eventually passed into the stomach. There
is also mild smooth narrowing in the region of the muscular a ring.
This may be due to reflux. No esophageal mass.

Mild inducible GE reflux with water swallowing.

The stomach, duodenal bulb and C-loop are normal. The duodenal
jejunal junction is in its normal anatomic location. The proximal
loops of jejunum appear normal.
IMPRESSION: 1. Small sliding-type hiatal hernia and a lower esophageal mucosal B
ring (Schatzki's ring). The 13 mm barium pill did eventually pass
through this area.
2. Mild smooth narrowing just above the esophageal vestibule could
be a mild reflux stricture.
3. Inducible GE reflux with water swallowing.
4. Normal appearance of the stomach and duodenum.

## 2020-07-19 DIAGNOSIS — I10 Essential (primary) hypertension: Secondary | ICD-10-CM | POA: Diagnosis not present

## 2020-07-19 DIAGNOSIS — Z0001 Encounter for general adult medical examination with abnormal findings: Secondary | ICD-10-CM | POA: Diagnosis not present

## 2020-07-19 DIAGNOSIS — R69 Illness, unspecified: Secondary | ICD-10-CM | POA: Diagnosis not present

## 2020-07-19 DIAGNOSIS — Z0000 Encounter for general adult medical examination without abnormal findings: Secondary | ICD-10-CM | POA: Diagnosis not present

## 2020-07-19 DIAGNOSIS — E559 Vitamin D deficiency, unspecified: Secondary | ICD-10-CM | POA: Diagnosis not present

## 2020-07-19 DIAGNOSIS — E669 Obesity, unspecified: Secondary | ICD-10-CM | POA: Diagnosis not present

## 2020-08-30 DIAGNOSIS — Z008 Encounter for other general examination: Secondary | ICD-10-CM | POA: Diagnosis not present

## 2020-08-30 DIAGNOSIS — R69 Illness, unspecified: Secondary | ICD-10-CM | POA: Diagnosis not present

## 2020-08-30 DIAGNOSIS — Z87891 Personal history of nicotine dependence: Secondary | ICD-10-CM | POA: Diagnosis not present

## 2020-08-30 DIAGNOSIS — Z823 Family history of stroke: Secondary | ICD-10-CM | POA: Diagnosis not present

## 2020-08-30 DIAGNOSIS — H353111 Nonexudative age-related macular degeneration, right eye, early dry stage: Secondary | ICD-10-CM | POA: Diagnosis not present

## 2020-08-30 DIAGNOSIS — Z7982 Long term (current) use of aspirin: Secondary | ICD-10-CM | POA: Diagnosis not present

## 2020-08-30 DIAGNOSIS — Z833 Family history of diabetes mellitus: Secondary | ICD-10-CM | POA: Diagnosis not present

## 2020-08-30 DIAGNOSIS — Z809 Family history of malignant neoplasm, unspecified: Secondary | ICD-10-CM | POA: Diagnosis not present

## 2020-08-30 DIAGNOSIS — Z6833 Body mass index (BMI) 33.0-33.9, adult: Secondary | ICD-10-CM | POA: Diagnosis not present

## 2020-08-30 DIAGNOSIS — Z8249 Family history of ischemic heart disease and other diseases of the circulatory system: Secondary | ICD-10-CM | POA: Diagnosis not present

## 2020-08-30 DIAGNOSIS — I10 Essential (primary) hypertension: Secondary | ICD-10-CM | POA: Diagnosis not present

## 2020-08-30 DIAGNOSIS — E669 Obesity, unspecified: Secondary | ICD-10-CM | POA: Diagnosis not present

## 2020-10-10 DIAGNOSIS — Z01419 Encounter for gynecological examination (general) (routine) without abnormal findings: Secondary | ICD-10-CM | POA: Diagnosis not present

## 2020-10-10 DIAGNOSIS — Z780 Asymptomatic menopausal state: Secondary | ICD-10-CM | POA: Diagnosis not present

## 2020-10-11 ENCOUNTER — Other Ambulatory Visit: Payer: Self-pay | Admitting: Nurse Practitioner

## 2020-10-11 DIAGNOSIS — Z780 Asymptomatic menopausal state: Secondary | ICD-10-CM

## 2020-10-12 ENCOUNTER — Other Ambulatory Visit: Payer: Self-pay | Admitting: Nurse Practitioner

## 2020-10-12 DIAGNOSIS — Z1231 Encounter for screening mammogram for malignant neoplasm of breast: Secondary | ICD-10-CM

## 2020-10-19 ENCOUNTER — Ambulatory Visit
Admission: RE | Admit: 2020-10-19 | Discharge: 2020-10-19 | Disposition: A | Discharge status: Home / Self Care | Payer: Medicare HMO | Source: Ambulatory Visit | Attending: Nurse Practitioner | Admitting: Nurse Practitioner

## 2020-10-19 ENCOUNTER — Telehealth: Payer: Self-pay

## 2020-10-19 ENCOUNTER — Other Ambulatory Visit: Payer: Self-pay

## 2020-10-19 DIAGNOSIS — Z780 Asymptomatic menopausal state: Secondary | ICD-10-CM

## 2020-10-19 DIAGNOSIS — Z1231 Encounter for screening mammogram for malignant neoplasm of breast: Secondary | ICD-10-CM

## 2020-10-19 DIAGNOSIS — M85832 Other specified disorders of bone density and structure, left forearm: Secondary | ICD-10-CM | POA: Diagnosis not present

## 2020-10-19 NOTE — Telephone Encounter (Signed)
Patient requested BP check while at Dexa Scan appointment at [facility] @ [facility] ([address] BP: 168/94 Left arm/Sitting/Regular cuff. Patient was in no distress, doing okay otherwise, stated she is concerned about the high readings (200/106, 195/102) she was receiving on her home BP unit. BP reported to Dr. Kevan Ny office, left voicemail on nurseline.

## 2020-11-06 DIAGNOSIS — I10 Essential (primary) hypertension: Secondary | ICD-10-CM | POA: Diagnosis not present

## 2020-12-21 DIAGNOSIS — I10 Essential (primary) hypertension: Secondary | ICD-10-CM | POA: Diagnosis not present

## 2021-01-19 DIAGNOSIS — I10 Essential (primary) hypertension: Secondary | ICD-10-CM | POA: Diagnosis not present

## 2021-01-19 DIAGNOSIS — R69 Illness, unspecified: Secondary | ICD-10-CM | POA: Diagnosis not present

## 2021-04-23 DIAGNOSIS — R69 Illness, unspecified: Secondary | ICD-10-CM | POA: Diagnosis not present

## 2021-04-23 DIAGNOSIS — I10 Essential (primary) hypertension: Secondary | ICD-10-CM | POA: Diagnosis not present

## 2021-07-07 ENCOUNTER — Other Ambulatory Visit: Payer: Self-pay

## 2021-07-07 ENCOUNTER — Emergency Department (HOSPITAL_BASED_OUTPATIENT_CLINIC_OR_DEPARTMENT_OTHER): Payer: Medicare HMO

## 2021-07-07 ENCOUNTER — Emergency Department (HOSPITAL_BASED_OUTPATIENT_CLINIC_OR_DEPARTMENT_OTHER)
Admission: EM | Admit: 2021-07-07 | Discharge: 2021-07-07 | Disposition: A | Discharge status: Home / Self Care | Payer: Medicare HMO | Attending: Emergency Medicine | Admitting: Emergency Medicine

## 2021-07-07 ENCOUNTER — Encounter (HOSPITAL_BASED_OUTPATIENT_CLINIC_OR_DEPARTMENT_OTHER): Payer: Self-pay

## 2021-07-07 DIAGNOSIS — R109 Unspecified abdominal pain: Secondary | ICD-10-CM | POA: Diagnosis not present

## 2021-07-07 DIAGNOSIS — Z7982 Long term (current) use of aspirin: Secondary | ICD-10-CM | POA: Diagnosis not present

## 2021-07-07 DIAGNOSIS — R000 Tachycardia, unspecified: Secondary | ICD-10-CM | POA: Diagnosis not present

## 2021-07-07 DIAGNOSIS — Z79899 Other long term (current) drug therapy: Secondary | ICD-10-CM | POA: Insufficient documentation

## 2021-07-07 DIAGNOSIS — I10 Essential (primary) hypertension: Secondary | ICD-10-CM | POA: Diagnosis not present

## 2021-07-07 DIAGNOSIS — R112 Nausea with vomiting, unspecified: Secondary | ICD-10-CM

## 2021-07-07 DIAGNOSIS — R1110 Vomiting, unspecified: Secondary | ICD-10-CM | POA: Diagnosis not present

## 2021-07-07 DIAGNOSIS — R0609 Other forms of dyspnea: Secondary | ICD-10-CM

## 2021-07-07 DIAGNOSIS — E876 Hypokalemia: Secondary | ICD-10-CM | POA: Diagnosis not present

## 2021-07-07 LAB — CBC
HCT: 47.9 % — ABNORMAL HIGH (ref 36.0–46.0)
Hemoglobin: 16.2 g/dL — ABNORMAL HIGH (ref 12.0–15.0)
MCH: 30.7 pg (ref 26.0–34.0)
MCHC: 33.8 g/dL (ref 30.0–36.0)
MCV: 90.9 fL (ref 80.0–100.0)
Platelets: 282 10*3/uL (ref 150–400)
RBC: 5.27 MIL/uL — ABNORMAL HIGH (ref 3.87–5.11)
RDW: 11.9 % (ref 11.5–15.5)
WBC: 10.9 10*3/uL — ABNORMAL HIGH (ref 4.0–10.5)
nRBC: 0 % (ref 0.0–0.2)

## 2021-07-07 LAB — COMPREHENSIVE METABOLIC PANEL
ALT: 25 U/L (ref 0–44)
AST: 27 U/L (ref 15–41)
Albumin: 4.7 g/dL (ref 3.5–5.0)
Alkaline Phosphatase: 80 U/L (ref 38–126)
Anion gap: 16 — ABNORMAL HIGH (ref 5–15)
BUN: 15 mg/dL (ref 8–23)
CO2: 26 mmol/L (ref 22–32)
Calcium: 10 mg/dL (ref 8.9–10.3)
Chloride: 97 mmol/L — ABNORMAL LOW (ref 98–111)
Creatinine, Ser: 0.71 mg/dL (ref 0.44–1.00)
GFR, Estimated: >60 mL/min (ref >60)
Glucose, Bld: 148 mg/dL — ABNORMAL HIGH (ref 70–99)
Potassium: 3.4 mmol/L — ABNORMAL LOW (ref 3.5–5.1)
Sodium: 139 mmol/L (ref 135–145)
Total Bilirubin: 0.8 mg/dL (ref 0.3–1.2)
Total Protein: 7.4 g/dL (ref 6.5–8.1)

## 2021-07-07 LAB — URINALYSIS, ROUTINE W REFLEX MICROSCOPIC
Bilirubin Urine: NEGATIVE
Glucose, UA: NEGATIVE mg/dL
Nitrite: NEGATIVE
Specific Gravity, Urine: 1.009 (ref 1.005–1.030)
pH: 6.5 (ref 5.0–8.0)

## 2021-07-07 LAB — TROPONIN I (HIGH SENSITIVITY)
Troponin I (High Sensitivity): 12 ng/L (ref <18)
Troponin I (High Sensitivity): 9 ng/L (ref <18)

## 2021-07-07 LAB — TSH: TSH: 0.52 u[IU]/mL (ref 0.350–4.500)

## 2021-07-07 LAB — MAGNESIUM: Magnesium: 1.9 mg/dL (ref 1.7–2.4)

## 2021-07-07 LAB — LIPASE, BLOOD: Lipase: 19 U/L (ref 11–51)

## 2021-07-07 MED ORDER — POTASSIUM CHLORIDE CRYS ER 20 MEQ PO TBCR
40.0000 meq | EXTENDED_RELEASE_TABLET | Freq: Once | ORAL | Status: AC
Start: 2021-07-07 — End: 2021-07-07
  Administered 2021-07-07: 40 meq via ORAL
  Filled 2021-07-07: qty 2

## 2021-07-07 MED ORDER — ONDANSETRON 4 MG PO TBDP: 4.0000 mg | ORAL_TABLET | Freq: Three times a day (TID) | ORAL | 0 refills | Status: DC | PRN

## 2021-07-07 MED ORDER — LACTATED RINGERS IV BOLUS
1000.0000 mL | Freq: Once | INTRAVENOUS | Status: AC
  Administered 2021-07-07: 1000 mL via INTRAVENOUS

## 2021-07-07 MED ORDER — ONDANSETRON HCL 4 MG/2ML IJ SOLN
4.0000 mg | Freq: Once | INTRAMUSCULAR | Status: AC | PRN
  Administered 2021-07-07: 4 mg via INTRAVENOUS
  Filled 2021-07-07: qty 2

## 2021-07-07 NOTE — ED Triage Notes (Signed)
States vomiting since Thursday night.  Became worse this am.  Generalized pain in abd.

## 2021-07-07 NOTE — Discharge Instructions (Addendum)
You are seen in the ER for nausea and vomiting.  We also discussed your fast heart rate, that appears to have resolved over time on its own, and shortness of breath with exertion.  The work-up in the ER is reassuring.  No profound organ damage was noted.  COVID-19 test, kidney function test, electrolytes are all within normal limits.  Heart enzymes are also normal.  Heart rate was up and down while in the ER.  He did also mention some shortness of breath with exertion.  We recommend that he follow-up with cardiologist for further evaluation of those symptoms.  Please return to the ER if your symptoms worsen; you have increased shortness of breath, pain, fevers, chills, inability to keep any medications down, confusion. Otherwise see the outpatient doctor as requested.

## 2021-07-07 NOTE — ED Provider Notes (Signed)
[facility] EMERGENCY DEPT Provider Note   CSN: [id number] Arrival date & time: 07/07/21  7341     History  Chief Complaint  Patient presents with   Emesis    Sheryl Bates is a 66 y.o. female.  HPI     66 year old female comes in with chief complaint of nausea, vomiting and diarrhea. Patient has history of endometriosis, kidney stones, hypertension.  She indicates that she started having nausea and vomiting on Thursday night.  Friday during the daytime, she was better but it Friday night she had similar symptoms she has generalized abdominal discomfort that is typically associated with the nausea and vomiting.  Patient denies any associated fevers, chills.  She did have 1 or 2 loose bowel movements.  Emesis has been nonbloody, stools have been nonbloody.  Patient did have bile in her vomitus.  Patient denies any sick contacts.  She is noted to be tachycardic with heart rate in the 150s.  Patient indicates that she has had some exertional shortness of breath for the last several weeks.  She particularly notes exertional shortness of breath with climbing up.  She denies any palpitations or chest pain. Pt has no hx of PE, DVT and denies any exogenous hormone (testosterone / estrogen) use, long distance travels or surgery in the past 6 weeks, active cancer, recent immobilization.   Home Medications Prior to Admission medications   Medication Sig Start Date End Date Taking? Authorizing Provider  aspirin EC 81 MG tablet Take 81 mg by mouth daily.    [provider]  cefpodoxime (VANTIN) 200 MG tablet Take 1 tablet (200 mg total) by mouth 2 (two) times daily. 12/26/17   McKenzie, Mardene Celeste, MD  Cholecalciferol (VITAMIN D PO) Take 2 capsules by mouth daily with breakfast.    [provider]  citalopram (CELEXA) 20 MG tablet Take 1 tablet (20 mg total) by mouth daily. 11/24/13   [name], MD  hydrALAZINE (APRESOLINE) 25 MG tablet Take 1 tablet  (25 mg total) by mouth 3 (three) times daily. 12/26/17   McKenzie, Mardene Celeste, MD  losartan (COZAAR) 100 MG tablet Take 1 tablet (100 mg total) by mouth daily. 12/27/17   McKenzie, Mardene Celeste, MD  metoprolol tartrate (LOPRESSOR) 50 MG tablet Take 1 tablet (50 mg total) by mouth 2 (two) times daily. 12/26/17   McKenzie, Mardene Celeste, MD  promethazine (PHENERGAN) 12.5 MG tablet Take 1 tablet (12.5 mg total) by mouth every 6 (six) hours as needed for nausea or vomiting. 12/26/17   McKenzie, Mardene Celeste, MD      Allergies    Sulfonamide derivatives and Lisinopril    Review of Systems   Review of Systems  Physical Exam Updated Vital Signs BP (!) 150/77   Pulse 71   Temp 98.6 F (37 C) (Oral)   Resp (!) 22   Ht 5\' 3"  (1.6 m)   Wt 81.6 kg   SpO2 91%   BMI 31.89 kg/m  Physical Exam Vitals and nursing note reviewed.  Constitutional:      Appearance: She is well-developed.  HENT:     Head: Atraumatic.  Eyes:     Extraocular Movements: Extraocular movements intact.     Pupils: Pupils are equal, round, and reactive to light.  Cardiovascular:     Rate and Rhythm: Regular rhythm. Tachycardia present.  Pulmonary:     Effort: Pulmonary effort is normal.     Breath sounds: No rhonchi or rales.  Chest:  Chest wall: No tenderness.  Abdominal:     Tenderness: There is abdominal tenderness. There is no guarding or rebound.  Musculoskeletal:     Cervical back: Normal range of motion and neck supple.     Right lower leg: No edema.     Left lower leg: No edema.  Skin:    General: Skin is warm and dry.  Neurological:     Mental Status: She is alert and oriented to person, place, and time.    ED Results / Procedures / Treatments   Labs (all labs ordered are listed, but only abnormal results are displayed) Labs Reviewed  COMPREHENSIVE METABOLIC PANEL - Abnormal; Notable for the following components:      Result Value   Potassium 3.4 (*)    Chloride 97 (*)    Glucose, Bld 148 (*)     Anion gap 16 (*)    All other components within normal limits  CBC - Abnormal; Notable for the following components:   WBC 10.9 (*)    RBC 5.27 (*)    Hemoglobin 16.2 (*)    HCT 47.9 (*)    All other components within normal limits  URINALYSIS, ROUTINE W REFLEX MICROSCOPIC - Abnormal; Notable for the following components:   Hgb urine dipstick TRACE (*)    Ketones, ur TRACE (*)    Protein, ur TRACE (*)    Leukocytes,Ua MODERATE (*)    Bacteria, UA FEW (*)    Non Squamous Epithelial 0-5 (*)    All other components within normal limits  LIPASE, BLOOD  MAGNESIUM  TSH  TROPONIN I (HIGH SENSITIVITY)  TROPONIN I (HIGH SENSITIVITY)    EKG EKG Interpretation  Date/Time:  Saturday Jul 07 2021 07:36:34 EDT Ventricular Rate:  151 PR Interval:  143 QRS Duration: 75 QT Interval:  342 QTC Calculation: 543 R Axis:   -74 Text Interpretation: atrial tachycardia vs aflutter Atrial premature complex Left anterior fascicular block Anteroseptal infarct, age indeterminate Prolonged QT interval Confirmed by Derwood Kaplan [phone]) on 07/07/2021 8:41:30 AM    EKG Interpretation  Date/Time:  Saturday Jul 07 2021 10:50:25 EDT Ventricular Rate:  82 PR Interval:  187 QRS Duration: 99 QT Interval:  385 QTC Calculation: 450 R Axis:   -69 Text Interpretation: Sinus rhythm Left anterior fascicular block Anteroseptal infarct, age indeterminate sinus rhythm - tachycardia resolved Confirmed by Derwood Kaplan [phone]) on 07/07/2021 11:12:28 AM        Radiology No results found.  Procedures Procedures    Medications Ordered in ED Medications  ondansetron (ZOFRAN) injection 4 mg (4 mg Intravenous Given 07/07/21 0747)  lactated ringers bolus 1,000 mL (0 mLs Intravenous Stopped 07/07/21 0956)  potassium chloride SA (KLOR-CON M) CR tablet 40 mEq (40 mEq Oral Given 07/07/21 1048)    ED Course/ Medical Decision Making/ A&P Clinical Course as of 07/07/21 1111  Sat Jul 07, 2021  1110 Comprehensive  metabolic panel(!) Mild hypokalemia noted.  No renal failure.  Glucose is only slightly elevated. [AN]  1110 Troponin I (High Sensitivity) Initial troponin is reassuring. [AN]  1110 Hemoglobin(!): 16.2 Hemoconcentration noted, likely secondary to dehydration [AN]  1110 Urinalysis, Routine w reflex microscopic Urine, Clean Catch(!) No clear evidence of infection.  Mild dehydration noted [AN]    Clinical Course User Index [AN] Derwood Kaplan, MD                           Medical Decision Making Amount and/or Complexity  of Data Reviewed Labs: ordered.  Risk Prescription drug management.   This patient presents to the ED with chief complaint(s) of nausea and vomiting and is noted to have tachycardia with pertinent past medical history of hypertension, endometriosis, kidney stones which further complicates the presenting complaint. The complaint involves an extensive differential diagnosis and also carries with it a high risk of complications and morbidity.    The differential diagnosis includes : Nausea and vomiting secondary to UTI, gastroenteritis.  There is no significant abdominal pain, therefore doubt this is small bowel obstruction or ileus.  Patient is noted to be tachycardic.  Her main complaint is not shortness of breath, but she does indicate having some exertional shortness of breath without any chest pain.  She denies any palpitations.  She is having dizziness, that could be because of dehydration from her nausea and vomiting, but also because of her tachycardia.  Anxiety is another possibility.  Patient does not have any PE risk factors.  No signs of DVT.  I do not think a PE work-up is indicated at this time.  We will monitor her closely and see if further work-up for tachydysrhythmia is needed.  Given that her heart rate is in the 150s, a flutter is in the differential diagnosis. Cardiac exam is reassuring.  Pulmonary exam is reassuring.  No clear evidence of CHF or pulmonary  edema.    The initial plan is to order basic labs including troponins, basic labs and urine analysis.  We will also get acute abdominal series.  Repeat EKG will be ordered.   Additional history obtained: Additional history obtained from spouse Records reviewed previous admission documents.  Also reviewed previous upper endoscopy which was reassuring.  Independent labs interpretation:  The following labs were independently interpreted: Patient's troponin is normal.  CBC is reassuring besides hemoconcentration.  UA shows mild ketones.  Independent visualization of imaging: - I independently visualized the following imaging with scope of interpretation limited to determining acute life threatening conditions related to emergency care: X-ray of the chest, which revealed no evidence of large pleural effusion or pulmonary edema  Treatment and Reassessment: Patient reassessed around 11 AM.  Her heart rate has improved.  Repeat EKG shows sinus tachycardia with heart rate in 118.  Soon after, heart rate dropped into 80s.  There is no clear evidence of A-fib.  Given the exertional shortness of breath and tachycardia that we cannot explain, we will give her cardiology follow-up   Consideration for admission or further workup: Admission was considered for the patient given her profound tachycardia and symptoms of dizziness with nausea and vomiting.  However patient has passed oral challenge, heart rate is improved and she has been hydrated in the ED.  No organ dysfunction.  Therefore we will not proceed with admission.    Final Clinical Impression(s) / ED Diagnoses Final diagnoses:  Tachycardia  Dyspnea on exertion  Nausea and vomiting, unspecified vomiting type    Rx / DC Orders ED Discharge Orders     None         Derwood KaplanNanavati, Yusra Ravert, MD 07/07/21 1119

## 2021-07-13 ENCOUNTER — Emergency Department (HOSPITAL_BASED_OUTPATIENT_CLINIC_OR_DEPARTMENT_OTHER): Payer: Medicare HMO

## 2021-07-13 ENCOUNTER — Observation Stay (HOSPITAL_BASED_OUTPATIENT_CLINIC_OR_DEPARTMENT_OTHER)
Admission: EM | Admit: 2021-07-13 | Discharge: 2021-07-14 | Disposition: A | Discharge status: Home / Self Care | Payer: Medicare HMO | Attending: Internal Medicine | Admitting: Internal Medicine

## 2021-07-13 ENCOUNTER — Other Ambulatory Visit: Payer: Self-pay

## 2021-07-13 ENCOUNTER — Encounter (HOSPITAL_BASED_OUTPATIENT_CLINIC_OR_DEPARTMENT_OTHER): Payer: Self-pay | Admitting: Obstetrics and Gynecology

## 2021-07-13 DIAGNOSIS — F418 Other specified anxiety disorders: Secondary | ICD-10-CM

## 2021-07-13 DIAGNOSIS — F32A Depression, unspecified: Secondary | ICD-10-CM

## 2021-07-13 DIAGNOSIS — R112 Nausea with vomiting, unspecified: Secondary | ICD-10-CM | POA: Diagnosis not present

## 2021-07-13 DIAGNOSIS — S0990XA Unspecified injury of head, initial encounter: Secondary | ICD-10-CM | POA: Diagnosis not present

## 2021-07-13 DIAGNOSIS — R531 Weakness: Principal | ICD-10-CM

## 2021-07-13 DIAGNOSIS — Z7982 Long term (current) use of aspirin: Secondary | ICD-10-CM | POA: Insufficient documentation

## 2021-07-13 DIAGNOSIS — Z20822 Contact with and (suspected) exposure to covid-19: Secondary | ICD-10-CM | POA: Diagnosis not present

## 2021-07-13 DIAGNOSIS — E860 Dehydration: Secondary | ICD-10-CM | POA: Insufficient documentation

## 2021-07-13 DIAGNOSIS — N10 Acute tubulo-interstitial nephritis: Secondary | ICD-10-CM | POA: Insufficient documentation

## 2021-07-13 DIAGNOSIS — E876 Hypokalemia: Secondary | ICD-10-CM | POA: Diagnosis not present

## 2021-07-13 DIAGNOSIS — R8271 Bacteriuria: Secondary | ICD-10-CM | POA: Insufficient documentation

## 2021-07-13 DIAGNOSIS — Z79899 Other long term (current) drug therapy: Secondary | ICD-10-CM | POA: Diagnosis not present

## 2021-07-13 DIAGNOSIS — R109 Unspecified abdominal pain: Secondary | ICD-10-CM | POA: Diagnosis not present

## 2021-07-13 DIAGNOSIS — Z87891 Personal history of nicotine dependence: Secondary | ICD-10-CM | POA: Insufficient documentation

## 2021-07-13 DIAGNOSIS — R296 Repeated falls: Secondary | ICD-10-CM | POA: Insufficient documentation

## 2021-07-13 DIAGNOSIS — Z043 Encounter for examination and observation following other accident: Secondary | ICD-10-CM | POA: Diagnosis not present

## 2021-07-13 DIAGNOSIS — N179 Acute kidney failure, unspecified: Secondary | ICD-10-CM | POA: Diagnosis not present

## 2021-07-13 DIAGNOSIS — R69 Illness, unspecified: Secondary | ICD-10-CM | POA: Diagnosis not present

## 2021-07-13 DIAGNOSIS — F419 Anxiety disorder, unspecified: Secondary | ICD-10-CM | POA: Diagnosis not present

## 2021-07-13 DIAGNOSIS — N3000 Acute cystitis without hematuria: Secondary | ICD-10-CM | POA: Insufficient documentation

## 2021-07-13 DIAGNOSIS — M47812 Spondylosis without myelopathy or radiculopathy, cervical region: Secondary | ICD-10-CM | POA: Diagnosis not present

## 2021-07-13 DIAGNOSIS — I951 Orthostatic hypotension: Secondary | ICD-10-CM | POA: Insufficient documentation

## 2021-07-13 DIAGNOSIS — R9431 Abnormal electrocardiogram [ECG] [EKG]: Secondary | ICD-10-CM | POA: Diagnosis not present

## 2021-07-13 DIAGNOSIS — R079 Chest pain, unspecified: Secondary | ICD-10-CM | POA: Diagnosis not present

## 2021-07-13 DIAGNOSIS — I10 Essential (primary) hypertension: Secondary | ICD-10-CM | POA: Diagnosis not present

## 2021-07-13 LAB — HEPATIC FUNCTION PANEL
ALT: 26 U/L (ref 0–44)
AST: 23 U/L (ref 15–41)
Albumin: 4.5 g/dL (ref 3.5–5.0)
Alkaline Phosphatase: 65 U/L (ref 38–126)
Bilirubin, Direct: 0.3 mg/dL — ABNORMAL HIGH (ref 0.0–0.2)
Indirect Bilirubin: 1 mg/dL — ABNORMAL HIGH (ref 0.3–0.9)
Total Bilirubin: 1.3 mg/dL — ABNORMAL HIGH (ref 0.3–1.2)
Total Protein: 7.1 g/dL (ref 6.5–8.1)

## 2021-07-13 LAB — I-STAT ARTERIAL BLOOD GAS, ED
Acid-Base Excess: 3 mmol/L — ABNORMAL HIGH (ref 0.0–2.0)
Bicarbonate: 28.7 mmol/L — ABNORMAL HIGH (ref 20.0–28.0)
Calcium, Ion: 1.15 mmol/L (ref 1.15–1.40)
HCT: 45 % (ref 36.0–46.0)
Hemoglobin: 15.3 g/dL — ABNORMAL HIGH (ref 12.0–15.0)
O2 Saturation: 68 %
Patient temperature: 37
Potassium: 3.9 mmol/L (ref 3.5–5.1)
Sodium: 131 mmol/L — ABNORMAL LOW (ref 135–145)
TCO2: 30 mmol/L (ref 22–32)
pCO2 arterial: 45.9 mmHg (ref 32–48)
pH, Arterial: 7.404 (ref 7.35–7.45)
pO2, Arterial: 35 mmHg — CL (ref 83–108)

## 2021-07-13 LAB — MAGNESIUM: Magnesium: 2.2 mg/dL (ref 1.7–2.4)

## 2021-07-13 LAB — CBC
HCT: 45.4 % (ref 36.0–46.0)
Hemoglobin: 15.7 g/dL — ABNORMAL HIGH (ref 12.0–15.0)
MCH: 30.8 pg (ref 26.0–34.0)
MCHC: 34.6 g/dL (ref 30.0–36.0)
MCV: 89 fL (ref 80.0–100.0)
Platelets: 314 10*3/uL (ref 150–400)
RBC: 5.1 MIL/uL (ref 3.87–5.11)
RDW: 12 % (ref 11.5–15.5)
WBC: 9.8 10*3/uL (ref 4.0–10.5)
nRBC: 0 % (ref 0.0–0.2)

## 2021-07-13 LAB — URINALYSIS, ROUTINE W REFLEX MICROSCOPIC
Bilirubin Urine: NEGATIVE
Glucose, UA: NEGATIVE mg/dL
Hgb urine dipstick: NEGATIVE
Ketones, ur: 15 mg/dL — AB
Nitrite: NEGATIVE
Protein, ur: NEGATIVE mg/dL
Specific Gravity, Urine: 1.025 (ref 1.005–1.030)
pH: 6.5 (ref 5.0–8.0)

## 2021-07-13 LAB — BASIC METABOLIC PANEL
Anion gap: 17 — ABNORMAL HIGH (ref 5–15)
BUN: 26 mg/dL — ABNORMAL HIGH (ref 8–23)
CO2: 25 mmol/L (ref 22–32)
Calcium: 10.3 mg/dL (ref 8.9–10.3)
Chloride: 92 mmol/L — ABNORMAL LOW (ref 98–111)
Creatinine, Ser: 1.01 mg/dL — ABNORMAL HIGH (ref 0.44–1.00)
GFR, Estimated: >60 mL/min (ref >60)
Glucose, Bld: 131 mg/dL — ABNORMAL HIGH (ref 70–99)
Potassium: 2.9 mmol/L — ABNORMAL LOW (ref 3.5–5.1)
Sodium: 134 mmol/L — ABNORMAL LOW (ref 135–145)

## 2021-07-13 LAB — LIPASE, BLOOD: Lipase: 37 U/L (ref 11–51)

## 2021-07-13 LAB — TROPONIN I (HIGH SENSITIVITY)
Troponin I (High Sensitivity): 6 ng/L (ref <18)
Troponin I (High Sensitivity): 8 ng/L (ref <18)

## 2021-07-13 LAB — RESP PANEL BY RT-PCR (FLU A&B, COVID) ARPGX2
Influenza A by PCR: NEGATIVE
Influenza B by PCR: NEGATIVE
SARS Coronavirus 2 by RT PCR: NEGATIVE

## 2021-07-13 LAB — CK: Total CK: 134 U/L (ref 38–234)

## 2021-07-13 LAB — CBG MONITORING, ED: Glucose-Capillary: 142 mg/dL — ABNORMAL HIGH (ref 70–99)

## 2021-07-13 MED ORDER — LACTATED RINGERS IV SOLN: INTRAVENOUS | Status: DC

## 2021-07-13 MED ORDER — HYDRALAZINE HCL 20 MG/ML IJ SOLN
10.0000 mg | Freq: Once | INTRAMUSCULAR | Status: AC
  Administered 2021-07-13: 10 mg via INTRAVENOUS
  Filled 2021-07-13: qty 1

## 2021-07-13 MED ORDER — HYDROXYZINE HCL 25 MG PO TABS: 25.0000 mg | ORAL_TABLET | Freq: Four times a day (QID) | ORAL | 0 refills | Status: DC | PRN

## 2021-07-13 MED ORDER — FAMOTIDINE IN NACL 20-0.9 MG/50ML-% IV SOLN
20.0000 mg | Freq: Once | INTRAVENOUS | Status: AC
  Administered 2021-07-13: 20 mg via INTRAVENOUS
  Filled 2021-07-13: qty 50

## 2021-07-13 MED ORDER — IOHEXOL 300 MG/ML  SOLN
100.0000 mL | Freq: Once | INTRAMUSCULAR | Status: AC | PRN
  Administered 2021-07-13: 100 mL via INTRAVENOUS

## 2021-07-13 MED ORDER — LORAZEPAM 2 MG/ML IJ SOLN: 0.5000 mg | Freq: Four times a day (QID) | INTRAMUSCULAR | Status: DC | PRN

## 2021-07-13 MED ORDER — POTASSIUM CHLORIDE ER 10 MEQ PO TBCR: 10.0000 meq | EXTENDED_RELEASE_TABLET | Freq: Every day | ORAL | 0 refills | Status: DC

## 2021-07-13 MED ORDER — HYDRALAZINE HCL 25 MG PO TABS
25.0000 mg | ORAL_TABLET | Freq: Three times a day (TID) | ORAL | Status: DC
  Administered 2021-07-14: 25 mg via ORAL
  Filled 2021-07-13: qty 1

## 2021-07-13 MED ORDER — MAGNESIUM SULFATE 2 GM/50ML IV SOLN
2.0000 g | Freq: Once | INTRAVENOUS | Status: AC
Start: 2021-07-13 — End: 2021-07-13
  Administered 2021-07-13: 2 g via INTRAVENOUS
  Filled 2021-07-13: qty 50

## 2021-07-13 MED ORDER — METOCLOPRAMIDE HCL 5 MG/ML IJ SOLN
5.0000 mg | Freq: Once | INTRAMUSCULAR | Status: AC
  Administered 2021-07-13: 5 mg via INTRAVENOUS
  Filled 2021-07-13: qty 2

## 2021-07-13 MED ORDER — CITALOPRAM HYDROBROMIDE 20 MG PO TABS
20.0000 mg | ORAL_TABLET | Freq: Every day | ORAL | Status: DC
  Administered 2021-07-14: 20 mg via ORAL
  Filled 2021-07-13: qty 1

## 2021-07-13 MED ORDER — POTASSIUM CHLORIDE 10 MEQ/100ML IV SOLN
10.0000 meq | INTRAVENOUS | Status: AC
  Administered 2021-07-13 (×3): 10 meq via INTRAVENOUS
  Filled 2021-07-13 (×3): qty 100

## 2021-07-13 MED ORDER — SODIUM CHLORIDE 0.9 % IV SOLN
1.0000 g | INTRAVENOUS | Status: DC
  Filled 2021-07-13: qty 10

## 2021-07-13 MED ORDER — CEFPODOXIME PROXETIL 200 MG PO TABS
200.0000 mg | ORAL_TABLET | Freq: Two times a day (BID) | ORAL | 0 refills | Status: DC
Start: 2021-07-13 — End: 2021-07-14

## 2021-07-13 MED ORDER — ENSURE ENLIVE PO LIQD
237.0000 mL | Freq: Two times a day (BID) | ORAL | Status: DC
  Administered 2021-07-14: 237 mL via ORAL

## 2021-07-13 MED ORDER — ONDANSETRON HCL 4 MG/2ML IJ SOLN
4.0000 mg | Freq: Once | INTRAMUSCULAR | Status: AC | PRN
  Administered 2021-07-13: 4 mg via INTRAVENOUS
  Filled 2021-07-13: qty 2

## 2021-07-13 MED ORDER — SODIUM CHLORIDE 0.9 % IV BOLUS
1000.0000 mL | Freq: Once | INTRAVENOUS | Status: AC
  Administered 2021-07-13: 1000 mL via INTRAVENOUS

## 2021-07-13 MED ORDER — ACETAMINOPHEN 325 MG PO TABS: 650.0000 mg | ORAL_TABLET | Freq: Four times a day (QID) | ORAL | Status: DC | PRN

## 2021-07-13 MED ORDER — DIPHENHYDRAMINE HCL 50 MG/ML IJ SOLN
25.0000 mg | Freq: Once | INTRAMUSCULAR | Status: AC
  Administered 2021-07-13: 25 mg via INTRAVENOUS
  Filled 2021-07-13: qty 1

## 2021-07-13 MED ORDER — POTASSIUM CHLORIDE 10 MEQ/100ML IV SOLN
10.0000 meq | INTRAVENOUS | Status: AC
  Administered 2021-07-13 (×2): 10 meq via INTRAVENOUS
  Filled 2021-07-13 (×2): qty 100

## 2021-07-13 MED ORDER — SUCRALFATE 1 G PO TABS: 1.0000 g | ORAL_TABLET | Freq: Three times a day (TID) | ORAL | 0 refills | Status: DC

## 2021-07-13 MED ORDER — LORAZEPAM 1 MG PO TABS
1.0000 mg | ORAL_TABLET | Freq: Once | ORAL | Status: AC
  Administered 2021-07-13: 1 mg via ORAL
  Filled 2021-07-13: qty 1

## 2021-07-13 MED ORDER — KETOROLAC TROMETHAMINE 15 MG/ML IJ SOLN
15.0000 mg | Freq: Once | INTRAMUSCULAR | Status: AC
Start: 2021-07-13 — End: 2021-07-13
  Administered 2021-07-13: 15 mg via INTRAVENOUS
  Filled 2021-07-13: qty 1

## 2021-07-13 MED ORDER — ACETAMINOPHEN 650 MG RE SUPP: 650.0000 mg | Freq: Four times a day (QID) | RECTAL | Status: DC | PRN

## 2021-07-13 MED ORDER — SODIUM CHLORIDE 0.9 % IV SOLN
1.0000 g | Freq: Once | INTRAVENOUS | Status: AC
  Administered 2021-07-13: 1 g via INTRAVENOUS
  Filled 2021-07-13: qty 10

## 2021-07-13 MED ORDER — PANTOPRAZOLE SODIUM 40 MG IV SOLR
40.0000 mg | INTRAVENOUS | Status: DC
  Administered 2021-07-13: 40 mg via INTRAVENOUS
  Filled 2021-07-13: qty 10

## 2021-07-13 MED ORDER — ONDANSETRON HCL 4 MG/2ML IJ SOLN: 4.0000 mg | Freq: Four times a day (QID) | INTRAMUSCULAR | Status: DC | PRN

## 2021-07-13 NOTE — ED Notes (Signed)
Pt provided water for fluid challenge 

## 2021-07-13 NOTE — ED Notes (Signed)
Patient still weak and in bed. Patient feels does not feel well and had to have electrolyte replacement and her blood pressure has started to increase again. Patient reports she discussed with previous physician regarding admission but she was hesitant. RN spoke to patient regarding concerns of a safe discharge and she reports she is willing to be admitted as she still feels poorly. RN spoke to Dr. Charm Barges regarding concerns.

## 2021-07-13 NOTE — Assessment & Plan Note (Signed)
  #)   Essential Hypertension: documented h/o such, with outpatient antihypertensive regimen including hydralazine, losartan, Lopressor.  SBP's in the ED today: Variable, most recently in the 1 teens after receiving doses of IV hydralazine and [facility].  In the setting of presenting acute kidney injury, will hold home losartan for now.  Additionally, in the context of initial borderline bradycardia, will hold home Lopressor for now as well.  Plan: Close monitoring of subsequent BP via routine VS. hold home losartan and Lopressor for now, as above.  Continue home hydralazine.

## 2021-07-13 NOTE — Discharge Instructions (Addendum)
It was a pleasure caring for you today in the emergency department.  Please return to the emergency department for any worsening or worrisome symptoms.  Please follow up with the PCP in the next 3 days for repeat potassium level

## 2021-07-13 NOTE — ED Triage Notes (Signed)
Patient reports she was seen about a week ago fot N/V. In the past week she has continued to decline. Reports multiple falls and last night fell and hit her head. Endorses feeling unable to eat and keep anything down. Reports she feels weak and unable to function properly and like she "just trembles"

## 2021-07-13 NOTE — Assessment & Plan Note (Signed)
  #)   Dehydration: Clinical suspicion for such, including the appearance of dry oral mucous membranes as well as laboratory findings notable for acute prerenal azotemia and UA demonstrating elevated specific gravity. Appears to be in the setting of   recent increase in GI losses in the form of nausea/vomiting as well as concomitant reduction in oral intake of both food and water over that timeframe.  No e/o associated hypotension, although there is evidence of consequential acute kidney injury, as further detailed below.   Plan: Monitor strict I's and O's.  Daily weights.  Repeat BMP in the morning. IVF's in form of lactated Ringer's at 75 cc/h x 12 hours.  Further evaluation management of AKI, as below.

## 2021-07-13 NOTE — Assessment & Plan Note (Signed)
 #)   Generalized weakness: 7 to 10 days of generalized weakness, in the absence of any evidence of acute focal neurologic deficits, including no evidence of acute focal weakness, while CT head shows no evidence of acute intracranial process.  While etiology is not entirely clear at this time, suspect contributions from dehydration resulting from responding nausea/vomiting over that timeframe, resulting in acute kidney injury.  She was also started on antibiotics at [facility] ED today for potential urinary tract infection.  We will continue IV antibiotics for this, as further detailed below.  No overt additional source of underlying infectious process at this time, including chest x-ray shows no evidence of acute cardiopulmonary process.  However, in the setting of generalized weakness, frequent falls, mild tachypnea, will add on procalcitonin level to further assess.  CT abdomen/pelvis showed no evidence of acute intra-abdominal/intrapelvic process.  Will further eval for any additional contributions from endocrine/metabolic sources, as detailed below.  Given her frequent falls, with unclear duration of time on the floor associated with these falls, and in the context of her generalized weakness with acute kidney injury, will also check CPK level.  Of note, TSH was found to be within normal limits when checked on 07/07/2021.  Overall, this new onset generalized weakness over the course of the last 10 days has resulted in new decline in pt's ability to contribute to ADL completion relative to baseline.   Plan: work-up and management of presenting dehydration, including IV fluids, as described below. PT/OT consults ordered for the AM. Fall precautions. CMP/CBC in the AM. Check MMA, serum magnesium level. Check CPK level, ionized calcium, procalcitonin level.  Further evaluation management of potential urinary tract infection, as further detailed below.  Check UDS

## 2021-07-13 NOTE — ED Notes (Signed)
Attempted to obtain urine sample, pt states she does not feel that she is able to urinate at this time.

## 2021-07-13 NOTE — ED Provider Notes (Signed)
[facility] EMERGENCY DEPT Provider Note   CSN: [id number] Arrival date & time: 07/13/21  0909     History {Add pertinent medical, surgical, social history, OB history to HPI:1} Chief Complaint  Patient presents with   Fall   Dizziness    Sheryl Bates is a 66 y.o. female.  Patient as above with significant medical history as below, including endometriosis, hyperlipidemia, hypertension who presents to the ED with complaint of nausea, vomiting, lightheadedness, abdominal pain, chest pain, poor p.o. intake.  Patient was evaluated in the ED 5/27 with similar complaints.  Work-up at that time was reassuring, she was advised to follow-up with cardiology.  Patient returned to the ED today is after initial period of improvement her symptoms they have returned and are worsening.  She is having nausea and vomiting.  Abdominal cramping.  Reports mild chest tightness and dyspnea associated with a cramping sensation.  Along with increased anxiety.  Reduced p.o. intake the last few days secondary to the nausea and epigastric abdominal pain.  Spouse also reports that patient has had 3 falls over the past week.  Oftentimes occurring after she stands up to go to the bathroom or stands up quickly from bed or from a chair.  She will feel lightheaded.  Patient had a fall this morning prior to arrival while she was walking to the restroom.  She again felt lightheaded after getting up from bed, felt she was going to pass out so she try to get back in the bed but cannot get back in the bed quickly enough and fell to the ground.  Struck the back of her head on the ground.  No LOC.  No thinners.    Past Medical History:  Diagnosis Date   Allergy    CIN I (cervical intraepithelial neoplasia I)    Endometriosis    Hyperlipidemia    Hypertension    Kidney stone    Ovarian cyst    right   Vitamin D deficiency     Past Surgical History:  Procedure Laterality Date   CERVICAL BIOPSY  W/ LOOP  ELECTRODE EXCISION     COLPOSCOPY     CYSTOSCOPY/RETROGRADE/URETEROSCOPY/STONE EXTRACTION WITH BASKET Left 12/20/2017   Procedure: CYSTOSCOPY/RETROGRADE/URETEROSCOPY/ LEFT URETERAL STENT PLACEMENT;  Surgeon: Malen Gauze, MD;  Location: [facility];  Service: Urology;  Laterality: Left;   ESOPHAGOGASTRODUODENOSCOPY N/A 03/18/2016   Procedure: ESOPHAGOGASTRODUODENOSCOPY (EGD);  Surgeon: Bernette Redbird, MD;  Location: [facility];  Service: Endoscopy;  Laterality: N/A;   IR GENERIC HISTORICAL  03/15/2016   IR EPIDUROGRAPHY 03/15/2016 [facility]-INTER[facility] RAD   KNEE SURGERY     Right   LAPAROSCOPY  1996   LEEP  1992   PELVIC LAPAROSCOPY       The history is provided by the patient and the spouse. No language interpreter was used.  Fall Associated symptoms include chest pain, abdominal pain, headaches and shortness of breath.  Dizziness Associated symptoms: chest pain, diarrhea, headaches, nausea, shortness of breath and vomiting   Associated symptoms: no palpitations       Home Medications Prior to Admission medications   Medication Sig Start Date End Date Taking? Authorizing Provider  aspirin EC 81 MG tablet Take 81 mg by mouth daily.    [provider]  cefpodoxime (VANTIN) 200 MG tablet Take 1 tablet (200 mg total) by mouth 2 (two) times daily. 12/26/17   McKenzie, Mardene Celeste, MD  Cholecalciferol (VITAMIN D PO) Take 2 capsules by mouth daily with breakfast.  [provider]  citalopram (CELEXA) 20 MG tablet Take 1 tablet (20 mg total) by mouth daily. 11/24/13   [name], MD  hydrALAZINE (APRESOLINE) 25 MG tablet Take 1 tablet (25 mg total) by mouth 3 (three) times daily. 12/26/17   McKenzie, Mardene Celeste, MD  losartan (COZAAR) 100 MG tablet Take 1 tablet (100 mg total) by mouth daily. 12/27/17   McKenzie, Mardene Celeste, MD  metoprolol tartrate (LOPRESSOR) 50 MG tablet Take 1 tablet (50 mg total) by mouth 2 (two) times daily. 12/26/17   McKenzie, Mardene Celeste, MD  ondansetron  (ZOFRAN-ODT) 4 MG disintegrating tablet Take 1 tablet (4 mg total) by mouth every 8 (eight) hours as needed for nausea or vomiting. 07/07/21   Derwood Kaplan, MD  promethazine (PHENERGAN) 12.5 MG tablet Take 1 tablet (12.5 mg total) by mouth every 6 (six) hours as needed for nausea or vomiting. 12/26/17   Malen Gauze, MD      Allergies    Sulfonamide derivatives and Lisinopril    Review of Systems   Review of Systems  Constitutional:  Positive for appetite change. Negative for chills and fever.  HENT:  Negative for facial swelling and trouble swallowing.   Eyes:  Negative for photophobia and visual disturbance.  Respiratory:  Positive for shortness of breath. Negative for cough.   Cardiovascular:  Positive for chest pain. Negative for palpitations.  Gastrointestinal:  Positive for abdominal pain, diarrhea, nausea and vomiting.  Endocrine: Negative for polydipsia and polyuria.  Genitourinary:  Negative for difficulty urinating and hematuria.  Musculoskeletal:  Positive for arthralgias. Negative for gait problem and joint swelling.  Skin:  Negative for pallor and rash.  Neurological:  Positive for light-headedness and headaches. Negative for dizziness and syncope.  Psychiatric/Behavioral:  Negative for agitation and confusion.    Physical Exam Updated Vital Signs BP 113/71   Pulse 73   Temp (!) 97.2 F (36.2 C)   Resp 19   SpO2 100%  Physical Exam Vitals and nursing note reviewed.  Constitutional:      General: She is not in acute distress.    Appearance: Normal appearance. She is not ill-appearing or diaphoretic.  HENT:     Head: Normocephalic and atraumatic.     Right Ear: External ear normal.     Left Ear: External ear normal.     Nose: Nose normal.     Mouth/Throat:     Mouth: Mucous membranes are moist.  Eyes:     General: No scleral icterus.       Right eye: No discharge.        Left eye: No discharge.     Extraocular Movements: Extraocular movements intact.      Pupils: Pupils are equal, round, and reactive to light.  Cardiovascular:     Rate and Rhythm: Normal rate and regular rhythm.     Pulses: Normal pulses.     Heart sounds: Normal heart sounds. No murmur heard.   No friction rub.  Pulmonary:     Effort: Pulmonary effort is normal. No respiratory distress.     Breath sounds: Normal breath sounds.  Abdominal:     General: Abdomen is flat.     Palpations: Abdomen is soft.     Tenderness: There is no abdominal tenderness. There is no guarding or rebound.       Comments: Not peritoneal  Musculoskeletal:        General: Normal range of motion.     Cervical back: Normal range  of motion. No rigidity.     Right lower leg: No edema.     Left lower leg: No edema.  Skin:    General: Skin is warm and dry.     Capillary Refill: Capillary refill takes less than 2 seconds.  Neurological:     Mental Status: She is alert and oriented to person, place, and time.     GCS: GCS eye subscore is 4. GCS verbal subscore is 5. GCS motor subscore is 6.     Cranial Nerves: Cranial nerves 2-12 are intact. No dysarthria or facial asymmetry.     Sensory: Sensation is intact.     Motor: Motor function is intact. No tremor.     Coordination: Coordination is intact.  Psychiatric:        Mood and Affect: Mood normal.        Behavior: Behavior normal.    ED Results / Procedures / Treatments   Labs (all labs ordered are listed, but only abnormal results are displayed) Labs Reviewed  CBC - Abnormal; Notable for the following components:      Result Value   Hemoglobin 15.7 (*)    All other components within normal limits  CBG MONITORING, ED - Abnormal; Notable for the following components:   Glucose-Capillary 142 (*)    All other components within normal limits  RESP PANEL BY RT-PCR (FLU A&B, COVID) ARPGX2  BASIC METABOLIC PANEL  URINALYSIS, ROUTINE W REFLEX MICROSCOPIC  HEPATIC FUNCTION PANEL  LIPASE, BLOOD  TROPONIN I (HIGH SENSITIVITY)     EKG None  Radiology No results found.  Procedures Procedures  {Document cardiac monitor, telemetry assessment procedure when appropriate:1}  Medications Ordered in ED Medications  sodium chloride 0.9 % bolus 1,000 mL (has no administration in time range)  famotidine (PEPCID) IVPB 20 mg premix (has no administration in time range)  metoCLOPramide (REGLAN) injection 5 mg (has no administration in time range)  diphenhydrAMINE (BENADRYL) injection 25 mg (has no administration in time range)  ketorolac (TORADOL) 15 MG/ML injection 15 mg (has no administration in time range)  ondansetron (ZOFRAN) injection 4 mg (4 mg Intravenous Given 07/13/21 0940)    ED Course/ Medical Decision Making/ A&P                           Medical Decision Making Amount and/or Complexity of Data Reviewed Labs: ordered. Radiology: ordered.  Risk Prescription drug management.    CC: Abdominal pain, nausea, vomiting, lightheadedness, chest pain, fall  This patient presents to the Emergency Department for the above complaint. This involves an extensive number of treatment options and is a complaint that carries with it a high risk of complications and morbidity. Vital signs were reviewed. Serious etiologies considered..  Differential diagnosis includes but is not exclusive to ectopic pregnancy, ovarian cyst, ovarian torsion, acute appendicitis, urinary tract infection, endometriosis, bowel obstruction, hernia, colitis, renal colic, gastroenteritis, volvulus etc. Differential also includes all life-threatening causes for chest pain. This includes but is not exclusive to acute coronary syndrome, aortic dissection, pulmonary embolism, cardiac tamponade, community-acquired pneumonia, pericarditis, musculoskeletal chest wall pain, etc.    Record review:  Previous records obtained and reviewed  Recent ED visits, recent labs and imaging, recent notes  Additional history obtained from spouse  Medical and  surgical history as noted above.   Work up as above, notable for:  Labs & imaging results that were available during my care of the patient were visualized by me  and considered in my medical decision making.   I ordered imaging studies which included chest x-ray, CT head, C-spine and abdomen pelvis. I visualized the imaging, interpreted images, and I agree with radiologist interpretation. ***  Cardiac monitoring reviewed and interpreted personally which shows NSR  Personally discussed patient care with consultant; ***  Management: IV fluids, emetics, GI cocktail.  She is not currently having chest pain, will hold off on aspirin at this time.  Reassessment:  ***  Admission was considered.                Social determinants of health include -  Social History   Socioeconomic History   Marital status: Married    Spouse name: Not on file   Number of children: Not on file   Years of education: Not on file   Highest education level: Not on file  Occupational History   Not on file  Tobacco Use   Smoking status: Former    Types: Cigarettes    Quit date: 05/03/2005    Years since quitting: 16.2   Smokeless tobacco: Never  Vaping Use   Vaping Use: Never used  Substance and Sexual Activity   Alcohol use: No    Comment: rare   Drug use: No   Sexual activity: Never    Birth control/protection: Post-menopausal  Other Topics Concern   Not on file  Social History Narrative   She is now raising a granddaughter (planning to adopt as of 07/19/2013)   Social Determinants of Health   Financial Resource Strain: Not on file  Food Insecurity: Not on file  Transportation Needs: Not on file  Physical Activity: Not on file  Stress: Not on file  Social Connections: Not on file  Intimate Partner Violence: Not on file      This chart was dictated using voice recognition software.  Despite best efforts to proofread,  errors can occur which can change the documentation  meaning.   {Document critical care time when appropriate:1} {Document review of labs and clinical decision tools ie heart score, Chads2Vasc2 etc:1}  {Document your independent review of radiology images, and any outside records:1} {Document your discussion with family members, caretakers, and with consultants:1} {Document social determinants of health affecting pt's care:1} {Document your decision making why or why not admission, treatments were needed:1} Final Clinical Impression(s) / ED Diagnoses Final diagnoses:  None    Rx / DC Orders ED Discharge Orders     None

## 2021-07-13 NOTE — Assessment & Plan Note (Signed)
 #)   Frequent falls: Patient notes multiple falls over the course of the last week, which appear to be ground-level and mechanical in nature as a consequence of her concomitant progressive generalized weakness over that timeframe.  No associated loss of consciousness, and patient does not believe that she hit her head as consequence of these falls.  On daily baby aspirin as outpatient.  CT head shows no evidence of acute intracranial process, including no evidence of intracranial hemorrhage, while CT cervical spine shows no evidence of acute fracture subluxation injury.  Overall, no overt evidence of acute fracture.  Additional potential contributing factors include aforementioned dehydration, as well as potential UTI, as below.  Plan: Fall precautions ordered.  IV fluids.  Further evaluation management of potential UTI, as below.  PT/OT consults placed.

## 2021-07-13 NOTE — Assessment & Plan Note (Signed)
 #)   Acute Kidney Injury: Presenting serum creatinine 1.01 compared to most recent prior value of 0.71 on 07/07/2021.  Appears prerenal as a consequence of intravascular depletion resulting from recent increase in GI losses as concomitant with diminished oral intake over the last 7 to 10 days, as above, with laboratory evidence of acute prerenal azotemia as well as elevated specific gravity on urinalysis, which was also notable for 15 ketones consistent with the patient's report of recent clot in oral intake.  Otherwise, no evidence of significant urinary casts on presenting UA.  Potential pharmacologic exacerbation via outpatient losartan.    Plan: monitor strict I's & O's and daily weights. Attempt to avoid nephrotoxic agents. Refrain from NSAIDs. Repeat CMP in the morning. Check serum magnesium level. Add-on random urine sodium and random urine creatinine.  Check CPK level.  Hold home losartan.  Continuous lactated Ringer's, as above.

## 2021-07-13 NOTE — H&P (Signed)
History and Physical    PLEASE NOTE THAT DRAGON DICTATION SOFTWARE WAS USED IN THE CONSTRUCTION OF THIS NOTE.   Georgena Weisheit Guarnieri JIR:[id number] DOB: 1955/06/26 DOA: 07/13/2021  PCP: Josetta Huddle, MD  Patient coming from: home   I have personally briefly reviewed patient's old medical records in [place]  Chief Complaint: Generalized weakness  HPI: Sheryl Bates is a 66 y.o. female with medical history significant for essential hypertension, depression, who is admitted to [facility] on 07/13/2021 by way of transfer from [facility] emergency department with generalized weakness after presenting from home to the latter facility complaining of such.  The patient notes progressive generalized weakness over the last 7 to 10 days, In the absence of any associated acute focal weakness, acute focal numbness, paresthesias, facial droop, slurred speech, expressive aphasia, acute change in vision, dysphagia, vertigo.  Over that timeframe, she also reports new onset intermittent nausea resulting in an average of 1-2 episodes per day over that timeframe of nonbloody, nonbilious emesis, with most recent episode occurring just prior to presenting to the [facility] emergency department earlier today.  Denies any associated subjective fever, chills, rigors, or generalized myalgias.  No associated any abdominal pain, melena, hematochezia, or diarrhea.  No recent trauma, travel, or known sick contacts.  In the context of her generalized weakness, the patient reports that she is experienced multiple mechanical ground-level falls over the course of the last week, in which she tripped while attempting to ambulate at home.  She reports that these falls have not been associated with any loss of consciousness, and she does not believe that she has hit her head as a component of any of these falls.  She is on a daily baby aspirin as an outpatient, representing her sole outpatient blood thinner.   Denies any significant acute arthralgias or myalgias as a result of these falls.  Denies any associated chest pain, shortness of breath, palpitations, diaphoresis, presyncope, or syncope.  No recent headache, neck pain, neck stiffness, rash, cough, dysuria or gross hematuria.  She had previously presented to the emergency department on 07/07/2021, with the above complaints.  Labs at that time were reassuring, including TSH that was found to be within normal limits at 0.520.  She received some IV fluids and was discharged to home with prn Zofran, which she notes has not been particularly effective in controlling her nausea/vomiting, resulting in a significant decline in oral intake of both food and water over the last several days.  She lives at home, and, relative to her baseline functionality at which she is able to independently perform all of her own ADLs, she notes progressive difficulty with ADL completion over the last week as a consequence of the aforementioned progressive generalized weakness over that timeframe.      [facility] ED Course:  Vital signs in the ED were notable for the following: Afebrile; heart rate 55-81; blood pressure 137/59 -178/68, respiratory rate 16-23, oxygen saturation 95 to 100% on room air.   Labs were notable for the following: CMP notable for the following: Sodium 1345, potassium 2.9, bicarbonate 25, BUN 26, creatinine 1.01 compared to most recent prior serum creatinine did 1 is 0.71 on 07/07/2021, glucose 131, calcium 10.3, total bilirubin 1.3, otherwise, liver enzymes within normal limits.  Lipase 37.  High-sensitivity troponin I initially noted to be 6, with repeat value trending up slightly to 8.  CBC notable for the following: Open cell count 9800, hemoglobin 15.7.  Urinalysis notable for 6-10  white blood cells, moderate leukocyte esterase, discussed with will cells, 15 ketones, specific remedy 1.025.  Urine culture collected prior to initiation of IV  antibiotics.  COVID-19/influenza PCR found to be negative when checked in the ED today.  Imaging and additional notable ED work-up: EKG showed sinus rhythm with heart rate 58, left anterior fascicular block, QTc 474, and no evidence of T wave or ST changes, including no evidence of ST elevation.  Chest x-ray showed no evidence of acute cardiopulmonary process, including no evidence of infiltrate, edema, effusion, or pneumothorax.  CT abdomen/pelvis with contrast showed no evidence of acute intra-abdominal or acute intrapelvic process.  Noncontrast CT head showed no evidence of acute intracranial process, including no evidence of intracranial hemorrhage or any evidence of acute infarct.  CT cervical spine showed no evidence of acute cervical fracture or subluxation.  While in the ED, the following were administered: Hydralazine 10 mg IV x1, Benadryl 25 mg IV x1, Toradol 15 mg IV x1, Ativan p.o. x1 dose, Reglan 5 mg IV x1, Rocephin, Pepcid IV, 2 g of magnesium sulfate, potassium chloride 30 mEq IV over 3 hours x 1 dose, normal saline x1 L bolus, and Zofran.  Subsequently, the patient was transferred to [facility] overnight observation to med telemetry for further evaluation management of presenting generalized weakness resulting in frequent falls, in the setting of recent nausea/vomiting, with presenting labs notable for hypokalemia as well as acute kidney injury.    Review of Systems: As per HPI otherwise 10 point review of systems negative.   Past Medical History:  Diagnosis Date   Allergy    CIN I (cervical intraepithelial neoplasia I)    Endometriosis    Hyperlipidemia    Hypertension    Kidney stone    Ovarian cyst    right   Vitamin D deficiency     Past Surgical History:  Procedure Laterality Date   CERVICAL BIOPSY  W/ LOOP ELECTRODE EXCISION     COLPOSCOPY     CYSTOSCOPY/RETROGRADE/URETEROSCOPY/STONE EXTRACTION WITH BASKET Left 12/20/2017   Procedure:  CYSTOSCOPY/RETROGRADE/URETEROSCOPY/ LEFT URETERAL STENT PLACEMENT;  Surgeon: Cleon Gustin, MD;  Location: [facility] ORS;  Service: Urology;  Laterality: Left;   ESOPHAGOGASTRODUODENOSCOPY N/A 03/18/2016   Procedure: ESOPHAGOGASTRODUODENOSCOPY (EGD);  Surgeon: Ronald Lobo, MD;  Location: [facility];  Service: Endoscopy;  Laterality: N/A;   IR GENERIC HISTORICAL  03/15/2016   IR EPIDUROGRAPHY 03/15/2016 [facility]-INTERV RAD   KNEE SURGERY     Right   LAPAROSCOPY  1996   LEEP  1992   PELVIC LAPAROSCOPY      Social History:  reports that she quit smoking about 16 years ago. She has never used smokeless tobacco. She reports that she does not drink alcohol and does not use drugs.   Allergies  Allergen Reactions   Sulfonamide Derivatives Diarrhea and Nausea Only   Lisinopril Cough    Family History  Problem Relation Age of Onset   Hypertension Mother    Heart disease Mother    Diabetes Mother    Heart disease Father 67       CHF   Breast cancer Neg Hx     Family history reviewed and not pertinent    Prior to Admission medications   Medication Sig Start Date End Date Taking? Authorizing Provider  cefpodoxime (VANTIN) 200 MG tablet Take 1 tablet (200 mg total) by mouth 2 (two) times daily for 14 days. 07/13/21 07/27/21 Yes Wynona Dove A, DO  hydrOXYzine (ATARAX) 25 MG tablet Take  1 tablet (25 mg total) by mouth every 6 (six) hours as needed. 07/13/21  Yes Wynona Dove A, DO  potassium chloride (KLOR-CON) 10 MEQ tablet Take 1 tablet (10 mEq total) by mouth daily for 3 days. 07/13/21 07/16/21 Yes Wynona Dove A, DO  sucralfate (CARAFATE) 1 g tablet Take 1 tablet (1 g total) by mouth 4 (four) times daily -  with meals and at bedtime for 7 days. 07/13/21 07/20/21 Yes Jeanell Sparrow, DO  aspirin EC 81 MG tablet Take 81 mg by mouth daily.    [provider]  Cholecalciferol (VITAMIN D PO) Take 2 capsules by mouth daily with breakfast.    [provider]  citalopram (CELEXA) 20 MG tablet  Take 1 tablet (20 mg total) by mouth daily. 11/24/13   Elveria Rising, MD  hydrALAZINE (APRESOLINE) 25 MG tablet Take 1 tablet (25 mg total) by mouth 3 (three) times daily. 12/26/17   McKenzie, Candee Furbish, MD  losartan (COZAAR) 100 MG tablet Take 1 tablet (100 mg total) by mouth daily. 12/27/17   McKenzie, Candee Furbish, MD  metoprolol tartrate (LOPRESSOR) 50 MG tablet Take 1 tablet (50 mg total) by mouth 2 (two) times daily. 12/26/17   McKenzie, Candee Furbish, MD  ondansetron (ZOFRAN-ODT) 4 MG disintegrating tablet Take 1 tablet (4 mg total) by mouth every 8 (eight) hours as needed for nausea or vomiting. 07/07/21   Varney Biles, MD  promethazine (PHENERGAN) 12.5 MG tablet Take 1 tablet (12.5 mg total) by mouth every 6 (six) hours as needed for nausea or vomiting. 12/26/17   Cleon Gustin, MD     Objective    Physical Exam: Vitals:   07/13/21 1900 07/13/21 1933 07/13/21 2024 07/13/21 2048  BP: 100/76 (!) 115/57 (!) 112/56   Pulse: 73 75 67   Resp: '19 18 16   ' Temp:   97.9 F (36.6 C)   TempSrc:   Oral   SpO2: 95% 97% 99%   Weight:    82.1 kg  Height:    '5\' 3"'  (1.6 m)    General: appears to be stated age; alert, oriented Skin: warm, dry, no rash Head:  AT/[place] Mouth:  Oral mucosa membranes appear dry, normal dentition Neck: supple; trachea midline Heart:  RRR; did not appreciate any M/R/G Lungs: CTAB, did not appreciate any wheezes, rales, or rhonchi Abdomen: + BS; soft, ND, NT Vascular: 2+ pedal pulses b/l; 2+ radial pulses b/l Extremities: no peripheral edema, no muscle wasting Neuro: strength and sensation intact in upper and lower extremities b/l    Labs on Admission: I have personally reviewed following labs and imaging studies  CBC: Recent Labs  Lab 07/07/21 0738 07/13/21 0940 07/13/21 1239  WBC 10.9* 9.8  --   HGB 16.2* 15.7* 15.3*  HCT 47.9* 45.4 45.0  MCV 90.9 89.0  --   PLT 282 314  --    Basic Metabolic Panel: Recent Labs  Lab 07/07/21 0738  07/07/21 1040 07/13/21 0940 07/13/21 1239  NA 139  --  134* 131*  K 3.4*  --  2.9* 3.9  CL 97*  --  92*  --   CO2 26  --  25  --   GLUCOSE 148*  --  131*  --   BUN 15  --  26*  --   CREATININE 0.71  --  1.01*  --   CALCIUM 10.0  --  10.3  --   MG  --  1.9  --   --  GFR: Estimated Creatinine Clearance: 54.9 mL/min (A) (by C-G formula based on SCr of 1.01 mg/dL (H)). Liver Function Tests: Recent Labs  Lab 07/07/21 0738 07/13/21 0940  AST 27 23  ALT 25 26  ALKPHOS 80 65  BILITOT 0.8 1.3*  PROT 7.4 7.1  ALBUMIN 4.7 4.5   Recent Labs  Lab 07/07/21 0738 07/13/21 0940  LIPASE 19 37   No results for input(s): AMMONIA in the last 168 hours. Coagulation Profile: No results for input(s): INR, PROTIME in the last 168 hours. Cardiac Enzymes: No results for input(s): CKTOTAL, CKMB, CKMBINDEX, TROPONINI in the last 168 hours. BNP (last 3 results) No results for input(s): PROBNP in the last 8760 hours. HbA1C: No results for input(s): HGBA1C in the last 72 hours. CBG: Recent Labs  Lab 07/13/21 0936  GLUCAP 142*   Lipid Profile: No results for input(s): CHOL, HDL, LDLCALC, TRIG, CHOLHDL, LDLDIRECT in the last 72 hours. Thyroid Function Tests: No results for input(s): TSH, T4TOTAL, FREET4, T3FREE, THYROIDAB in the last 72 hours. Anemia Panel: No results for input(s): VITAMINB12, FOLATE, FERRITIN, TIBC, IRON, RETICCTPCT in the last 72 hours. Urine analysis:    Component Value Date/Time   COLORURINE COLORLESS (A) 07/13/2021 0940   APPEARANCEUR CLEAR 07/13/2021 0940   LABSPEC 1.025 07/13/2021 0940   PHURINE 6.5 07/13/2021 0940   GLUCOSEU NEGATIVE 07/13/2021 0940   HGBUR NEGATIVE 07/13/2021 0940   BILIRUBINUR NEGATIVE 07/13/2021 0940   BILIRUBINUR n 07/19/2013 1200   KETONESUR 15 (A) 07/13/2021 0940   PROTEINUR NEGATIVE 07/13/2021 0940   UROBILINOGEN 0.2 07/19/2013 1200   UROBILINOGEN 0.2 12/31/2011 1629   NITRITE NEGATIVE 07/13/2021 0940   LEUKOCYTESUR MODERATE (A)  07/13/2021 0940    Radiological Exams on Admission: CT HEAD WO CONTRAST  Result Date: 07/13/2021 CLINICAL DATA:  Fall EXAM: CT HEAD WITHOUT CONTRAST CT CERVICAL SPINE WITHOUT CONTRAST TECHNIQUE: Multidetector CT imaging of the head and cervical spine was performed following the standard protocol without intravenous contrast. Multiplanar CT image reconstructions of the cervical spine were also generated. RADIATION DOSE REDUCTION: This exam was performed according to the departmental dose-optimization program which includes automated exposure control, adjustment of the mA and/or kV according to patient size and/or use of iterative reconstruction technique. COMPARISON:  MR head 03/16/2016 FINDINGS: CT HEAD FINDINGS Brain: There is no acute intracranial hemorrhage, extra-axial fluid collection, or acute infarct. Parenchymal volume is normal. There is a small remote lacunar infarct in the left caudate head, unchanged since the prior brain MRI. Gray-white differentiation is otherwise preserved. There is no mass lesion.  There is no mass effect or midline shift. Vascular: There is calcification of the bilateral cavernous ICAs. Skull: Normal. Negative for fracture or focal lesion. Sinuses/Orbits: The paranasal sinuses are clear. The globes and orbits are unremarkable. Other: None. CT CERVICAL SPINE FINDINGS Alignment: There is straightening of the normal cervical lordosis. There is no antero or retrolisthesis. There is no jumped or perched facets or other evidence of traumatic malalignment. Skull base and vertebrae: Skull base alignment is maintained. Vertebral body heights are preserved. There is no evidence of acute fracture. There is no suspicious osseous lesion. Soft tissues and spinal canal: No prevertebral fluid or swelling. No visible canal hematoma. Disc levels: There is disc space narrowing with associated degenerative endplate change most advanced at C4-C5 through C6-C7. Facet arthropathy is most advanced on  the right at C3-C4. There is no evidence of high-grade spinal canal stenosis. Upper chest: The imaged lung apices are clear. Other: None. IMPRESSION: 1.  No acute intracranial pathology. 2. No acute fracture or traumatic malalignment of the cervical spine. Electronically Signed   By: Valetta Mole M.D.   On: 07/13/2021 11:27   CT Cervical Spine Wo Contrast  Result Date: 07/13/2021 CLINICAL DATA:  Fall EXAM: CT HEAD WITHOUT CONTRAST CT CERVICAL SPINE WITHOUT CONTRAST TECHNIQUE: Multidetector CT imaging of the head and cervical spine was performed following the standard protocol without intravenous contrast. Multiplanar CT image reconstructions of the cervical spine were also generated. RADIATION DOSE REDUCTION: This exam was performed according to the departmental dose-optimization program which includes automated exposure control, adjustment of the mA and/or kV according to patient size and/or use of iterative reconstruction technique. COMPARISON:  MR head 03/16/2016 FINDINGS: CT HEAD FINDINGS Brain: There is no acute intracranial hemorrhage, extra-axial fluid collection, or acute infarct. Parenchymal volume is normal. There is a small remote lacunar infarct in the left caudate head, unchanged since the prior brain MRI. Gray-white differentiation is otherwise preserved. There is no mass lesion.  There is no mass effect or midline shift. Vascular: There is calcification of the bilateral cavernous ICAs. Skull: Normal. Negative for fracture or focal lesion. Sinuses/Orbits: The paranasal sinuses are clear. The globes and orbits are unremarkable. Other: None. CT CERVICAL SPINE FINDINGS Alignment: There is straightening of the normal cervical lordosis. There is no antero or retrolisthesis. There is no jumped or perched facets or other evidence of traumatic malalignment. Skull base and vertebrae: Skull base alignment is maintained. Vertebral body heights are preserved. There is no evidence of acute fracture. There is no  suspicious osseous lesion. Soft tissues and spinal canal: No prevertebral fluid or swelling. No visible canal hematoma. Disc levels: There is disc space narrowing with associated degenerative endplate change most advanced at C4-C5 through C6-C7. Facet arthropathy is most advanced on the right at C3-C4. There is no evidence of high-grade spinal canal stenosis. Upper chest: The imaged lung apices are clear. Other: None. IMPRESSION: 1. No acute intracranial pathology. 2. No acute fracture or traumatic malalignment of the cervical spine. Electronically Signed   By: Valetta Mole M.D.   On: 07/13/2021 11:27   CT ABDOMEN PELVIS W CONTRAST  Result Date: 07/13/2021 CLINICAL DATA:  Abdominal and back pain.  Patient fell last night. EXAM: CT ABDOMEN AND PELVIS WITH CONTRAST TECHNIQUE: Multidetector CT imaging of the abdomen and pelvis was performed using the standard protocol following bolus administration of intravenous contrast. RADIATION DOSE REDUCTION: This exam was performed according to the departmental dose-optimization program which includes automated exposure control, adjustment of the mA and/or kV according to patient size and/or use of iterative reconstruction technique. CONTRAST:  149m OMNIPAQUE IOHEXOL 300 MG/ML  SOLN COMPARISON:  12/15/2017. FINDINGS: Lower chest: Clear lung bases. Hepatobiliary: Decreased attenuation of the liver consistent with fatty infiltration. Normal liver size. No mass. No contusion or laceration. Normal gallbladder. No bile duct dilation. Pancreas: Normal. Spleen: Normal. Adrenals/Urinary Tract: No adrenal mass or hemorrhage. Kidneys normal size, orientation and position. Symmetric renal enhancement and excretion. No contusion or laceration. No mass. 5 mm nonobstructing midpole stone on the left. No hydronephrosis. Normal ureters. Normal bladder. Stomach/Bowel: Small hiatal hernia. Stomach otherwise unremarkable. Small bowel and colon are normal caliber. No wall thickening. No  inflammation. Multiple colonic diverticula most evident on the left. Normal appendix. No evidence of bowel or mesenteric injury. Vascular/Lymphatic: No vascular injury. Mild aortic atherosclerosis. No aneurysm. No enlarged lymph nodes. Reproductive: Uterus and bilateral adnexa are unremarkable. Other: No abdominal wall hernia or abnormality. No abdominopelvic  ascites. Musculoskeletal: No fracture. No bone lesion. Degenerative changes throughout the lumbar spine with loss of disc height, endplate spurring and Schmorl's nodes. Facet degenerative changes in the lower lumbar spine. Grade 1 anterolisthesis of L4 on L5. IMPRESSION: 1. No acute findings within the abdomen or pelvis. No evidence of injury. 2. Degenerative changes throughout the lumbar spine. 3. Mild aortic atherosclerosis. 5 mm nonobstructing stone in the left kidney. 4. Hepatic steatosis. Electronically Signed   By: Lajean Manes M.D.   On: 07/13/2021 11:23   DG Chest Portable 1 View  Result Date: 07/13/2021 CLINICAL DATA:  fall, cp dib EXAM: PORTABLE CHEST 1 VIEW COMPARISON:  Chest radiograph Jul 07, 2021. FINDINGS: No consolidation. No visible pleural effusions or pneumothorax. Cardiomediastinal silhouette is within normal limits. No displaced fracture. IMPRESSION: No evidence of acute cardiopulmonary disease. Electronically Signed   By: Margaretha Sheffield M.D.   On: 07/13/2021 10:31     EKG: Independently reviewed, with result as described above.    Assessment/Plan    Principal Problem:   Generalized weakness Active Problems:   Hypertension   Hypokalemia   Dehydration   Nausea & vomiting   Frequent falls   Acute cystitis   AKI (acute kidney injury) ([place])   Depression      #) Generalized weakness: 7 to 10 days of generalized weakness, in the absence of any evidence of acute focal neurologic deficits, including no evidence of acute focal weakness, while CT head shows no evidence of acute intracranial process.  While etiology is  not entirely clear at this time, suspect contributions from dehydration resulting from responding nausea/vomiting over that timeframe, resulting in acute kidney injury.  She was also started on antibiotics at [facility] ED today for potential urinary tract infection.  We will continue IV antibiotics for this, as further detailed below.  No overt additional source of underlying infectious process at this time, including chest x-ray shows no evidence of acute cardiopulmonary process.  However, in the setting of generalized weakness, frequent falls, mild tachypnea, will add on procalcitonin level to further assess.  CT abdomen/pelvis showed no evidence of acute intra-abdominal/intrapelvic process.  Will further eval for any additional contributions from endocrine/metabolic sources, as detailed below.  Given her frequent falls, with unclear duration of time on the floor associated with these falls, and in the context of her generalized weakness with acute kidney injury, will also check CPK level.  Of note, TSH was found to be within normal limits when checked on 07/07/2021.  Overall, this new onset generalized weakness over the course of the last 10 days has resulted in new decline in pt's ability to contribute to ADL completion relative to baseline.   Plan: work-up and management of presenting dehydration, including IV fluids, as described below. PT/OT consults ordered for the AM. Fall precautions. CMP/CBC in the AM. Check MMA, serum magnesium level. Check CPK level, ionized calcium, procalcitonin level.  Further evaluation management of potential urinary tract infection, as further detailed below.  Check UDS       #) Frequent falls: Patient notes multiple falls over the course of the last week, which appear to be ground-level and mechanical in nature as a consequence of her concomitant progressive generalized weakness over that timeframe.  No associated loss of consciousness, and patient does not believe  that she hit her head as consequence of these falls.  On daily baby aspirin as outpatient.  CT head shows no evidence of acute intracranial process, including no evidence of intracranial hemorrhage,  while CT cervical spine shows no evidence of acute fracture subluxation injury.  Overall, no overt evidence of acute fracture.  Additional potential contributing factors include aforementioned dehydration, as well as potential UTI, as below.  Plan: Fall precautions ordered.  IV fluids.  Further evaluation management of potential UTI, as below.  PT/OT consults placed.           #) Dehydration: Clinical suspicion for such, including the appearance of dry oral mucous membranes as well as laboratory findings notable for acute prerenal azotemia and UA demonstrating elevated specific gravity. Appears to be in the setting of   recent increase in GI losses in the form of nausea/vomiting as well as concomitant reduction in oral intake of both food and water over that timeframe.  No e/o associated hypotension, although there is evidence of consequential acute kidney injury, as further detailed below.   Plan: Monitor strict I's and O's.  Daily weights.  Repeat BMP in the morning. IVF's in form of lactated Ringer's at 75 cc/h x 12 hours.  Further evaluation management of AKI, as below.        #) Nausea/vomiting: Recurrent nausea over the course the last week resulting in recurrent episodes of nonbloody, nonbilious emesis, in the absence of any significant abdominal pain or diarrhea.  Etiology not entirely clear at this time although nausea/vomiting appears to have been refractory to Zofran ODT upon which the patient was discharged from the emergency department on 07/07/2021.  This contributed to her dehydration, and increased risk for falls, as above, this is contributing to her presenting AKI, as further detailed below..  CT abdomen/pelvis show no evidence of acute process, and liver enzymes are within normal  limits, without any evidence of cholestatic pattern, lipase is also nonelevated.  Potential contribution from potential urinary tract infection, as below.  TSH found to be within normal limits when checked on 07/07/2021.  Plan: IV fluids, as above.  As needed IV Zofran.  As needed IV Ativan for nausea/vomiting refractory to Zofran.  Add on serum magnesium level.  Further evaluation management of presenting hypokalemia, as below.  Repeat CMP and CBC in the morning.  Check urinary drug screen.         #) Acute cystitis: Borderline urinary tract infection, with urinalysis drawn at [facility] notable for 6-10 white blood cells as well as moderate leukocyte Estrace, in the absence of any associated squamous epithelial cells.  In the absence of objective fever or leukocytosis, SIRS criteria not currently met for sepsis.  At [facility] earlier today, urine culture was collected and she received a dose of IV Rocephin.  Given her constellation of presenting symptoms, will continue IV Rocephin for now, with anticipated course of 3 to 5 days of antibiotic coverage for uncomplicated UTI.  Plan: Follow-up results of urine culture.  Continue Rocephin.  Repeat CBC with differential in the morning.        #) Hypokalemia: Presenting serum potassium level 2.9, with contribution from recent increase in GI losses concomitant with normal intake, as above.  She received 30 mill equivalents of IV potassium chloride at [facility] earlier today in addition to empiric receipt of 2 g of IV magnesium sulfate.   Plan: will provide an additional 20 mill equivalents of IV potassium chloride over 2 hours at this time, with repeat CMP in the morning.  Add on serum magnesium level and plan to repeat this value in the morning.  Monitor on telemetry.  Further evaluation management nausea/vomiting, as above.  Continuous  lactated Ringer's, as above.            #) Acute Kidney Injury: Presenting serum creatinine 1.01  compared to most recent prior value of 0.71 on 07/07/2021.  Appears prerenal as a consequence of intravascular depletion resulting from recent increase in GI losses as concomitant with diminished oral intake over the last 7 to 10 days, as above, with laboratory evidence of acute prerenal azotemia as well as elevated specific gravity on urinalysis, which was also notable for 15 ketones consistent with the patient's report of recent clot in oral intake.  Otherwise, no evidence of significant urinary casts on presenting UA.  Potential pharmacologic exacerbation via outpatient losartan.    Plan: monitor strict I's & O's and daily weights. Attempt to avoid nephrotoxic agents. Refrain from NSAIDs. Repeat CMP in the morning. Check serum magnesium level. Add-on random urine sodium and random urine creatinine.  Check CPK level.  Hold home losartan.  Continuous lactated Ringer's, as above.          #) Essential Hypertension: documented h/o such, with outpatient antihypertensive regimen including hydralazine, losartan, Lopressor.  SBP's in the ED today: Variable, most recently in the 1 teens after receiving doses of IV hydralazine and [facility] ED.  In the setting of presenting acute kidney injury, will hold home losartan for now.  Additionally, in the context of initial borderline bradycardia, will hold home Lopressor for now as well.  Plan: Close monitoring of subsequent BP via routine VS. hold home losartan and Lopressor for now, as above.  Continue home hydralazine.         #) Depression: Documented history of such, on Celexa as an outpatient.  Presenting EKG notable for QTc 474.  It appears that the patient has been on this medication since at least 2015.  Plan: Continue home Celexa.       DVT prophylaxis: SCD's   Code Status: Full code Family Communication: none Disposition Plan: Per Rounding Team Consults called: none;  Admission status: Observation   PLEASE NOTE THAT DRAGON  DICTATION SOFTWARE WAS USED IN THE CONSTRUCTION OF THIS NOTE.   [place] DO [facility] [place]   07/13/2021, 8:55 PM

## 2021-07-13 NOTE — ED Notes (Signed)
Water at the bedside, pt states she took a few sips and has been able to hold down so far but will drink more as she can tolerate.

## 2021-07-13 NOTE — Assessment & Plan Note (Signed)
  #)   Hypokalemia: Presenting serum potassium level 2.9, with contribution from recent increase in GI losses concomitant with normal intake, as above.  She received 30 mill equivalents of IV potassium chloride at [facility] earlier today in addition to empiric receipt of 2 g of IV magnesium sulfate.   Plan: will provide an additional 20 mill equivalents of IV potassium chloride over 2 hours at this time, with repeat CMP in the morning.  Add on serum magnesium level and plan to repeat this value in the morning.  Monitor on telemetry.  Further evaluation management nausea/vomiting, as above.  Continuous lactated Ringer's, as above.

## 2021-07-13 NOTE — ED Notes (Signed)
[facility] now at bedside  

## 2021-07-13 NOTE — Assessment & Plan Note (Signed)
 #)   Acute cystitis: Borderline urinary tract infection, with urinalysis drawn at [facility] notable for 6-10 white blood cells as well as moderate leukocyte Estrace, in the absence of any associated squamous epithelial cells.  In the absence of objective fever or leukocytosis, SIRS criteria not currently met for sepsis.  At [facility] earlier today, urine culture was collected and she received a dose of IV Rocephin.  Given her constellation of presenting symptoms, will continue IV Rocephin for now, with anticipated course of 3 to 5 days of antibiotic coverage for uncomplicated UTI.  Plan: Follow-up results of urine culture.  Continue Rocephin.  Repeat CBC with differential in the morning.

## 2021-07-13 NOTE — ED Provider Notes (Signed)
I was asked by the nurse to reassess Sheryl Bates.  She has been sick for about 10 days with weakness shakiness dizziness frequent nausea and vomiting.  She has had multiple falls.  No sick contacts or recent travel.  Continues to feel weak.CT imaging does not show any acute findings.  Lab work equivocal for urine infection.  Low potassium.  COVID and flu negative.  Troponins unremarkable.  Patient does not feel able to go home. Physical Exam  BP (!) 171/72   Pulse 78   Temp (!) 97.2 F (36.2 C)   Resp 18   SpO2 100%   Physical Exam Patient is awake and alert.  Lungs clear heart regular rate and rhythm.  Abdomen soft without masses guarding or rebound.  Neurologically nonfocal. Procedures  Procedures  ED Course / MDM  Lab work reviewed low potassium.  This has been repleted along with IV fluids and nausea medication.  CT imaging of head neck and abdomen and pelvis do not show any acute findings. Medical Decision Making Amount and/or Complexity of Data Reviewed Labs: ordered. Radiology: ordered.  Risk Prescription drug management. Decision regarding hospitalization.   Discussed with Dr. Zenaida Niece [facility] will evaluate patient for admission.       Terrilee Files, MD 07/14/21 [phone]

## 2021-07-13 NOTE — Assessment & Plan Note (Signed)
  #)   Depression: Documented history of such, on Celexa as an outpatient.  Presenting EKG notable for QTc 474.  It appears that the patient has been on this medication since at least 2015.  Plan: Continue home Celexa.

## 2021-07-13 NOTE — Assessment & Plan Note (Signed)
 #)   Nausea/vomiting: Recurrent nausea over the course the last week resulting in recurrent episodes of nonbloody, nonbilious emesis, in the absence of any significant abdominal pain or diarrhea.  Etiology not entirely clear at this time although nausea/vomiting appears to have been refractory to Zofran ODT upon which the patient was discharged from the emergency department on 07/07/2021.  This contributed to her dehydration, and increased risk for falls, as above, this is contributing to her presenting AKI, as further detailed below..  CT abdomen/pelvis show no evidence of acute process, and liver enzymes are within normal limits, without any evidence of cholestatic pattern, lipase is also nonelevated.  Potential contribution from potential urinary tract infection, as below.  TSH found to be within normal limits when checked on 07/07/2021.  Plan: IV fluids, as above.  As needed IV Zofran.  As needed IV Ativan for nausea/vomiting refractory to Zofran.  Add on serum magnesium level.  Further evaluation management of presenting hypokalemia, as below.  Repeat CMP and CBC in the morning.  Check urinary drug screen.

## 2021-07-13 NOTE — ED Notes (Signed)
RT note: Venous blood drawn by RN, resulted by RT.

## 2021-07-14 DIAGNOSIS — R112 Nausea with vomiting, unspecified: Secondary | ICD-10-CM | POA: Diagnosis not present

## 2021-07-14 DIAGNOSIS — E860 Dehydration: Secondary | ICD-10-CM | POA: Diagnosis not present

## 2021-07-14 DIAGNOSIS — N179 Acute kidney failure, unspecified: Secondary | ICD-10-CM | POA: Diagnosis not present

## 2021-07-14 DIAGNOSIS — R531 Weakness: Secondary | ICD-10-CM | POA: Diagnosis not present

## 2021-07-14 DIAGNOSIS — I951 Orthostatic hypotension: Secondary | ICD-10-CM

## 2021-07-14 DIAGNOSIS — E876 Hypokalemia: Secondary | ICD-10-CM | POA: Diagnosis not present

## 2021-07-14 LAB — COMPREHENSIVE METABOLIC PANEL
ALT: 21 U/L (ref 0–44)
AST: 17 U/L (ref 15–41)
Albumin: 3.1 g/dL — ABNORMAL LOW (ref 3.5–5.0)
Alkaline Phosphatase: 52 U/L (ref 38–126)
Anion gap: 6 (ref 5–15)
BUN: 16 mg/dL (ref 8–23)
CO2: 26 mmol/L (ref 22–32)
Calcium: 8.5 mg/dL — ABNORMAL LOW (ref 8.9–10.3)
Chloride: 104 mmol/L (ref 98–111)
Creatinine, Ser: 0.76 mg/dL (ref 0.44–1.00)
GFR, Estimated: >60 mL/min (ref >60)
Glucose, Bld: 94 mg/dL (ref 70–99)
Potassium: 3.2 mmol/L — ABNORMAL LOW (ref 3.5–5.1)
Sodium: 136 mmol/L (ref 135–145)
Total Bilirubin: 0.9 mg/dL (ref 0.3–1.2)
Total Protein: 5.1 g/dL — ABNORMAL LOW (ref 6.5–8.1)

## 2021-07-14 LAB — CBC WITH DIFFERENTIAL/PLATELET
Abs Immature Granulocytes: 0.02 10*3/uL (ref 0.00–0.07)
Basophils Absolute: 0.1 10*3/uL (ref 0.0–0.1)
Basophils Relative: 1 %
Eosinophils Absolute: 0.1 10*3/uL (ref 0.0–0.5)
Eosinophils Relative: 1 %
HCT: 38 % (ref 36.0–46.0)
Hemoglobin: 12.8 g/dL (ref 12.0–15.0)
Immature Granulocytes: 0 %
Lymphocytes Relative: 27 %
Lymphs Abs: 1.8 10*3/uL (ref 0.7–4.0)
MCH: 31.9 pg (ref 26.0–34.0)
MCHC: 33.7 g/dL (ref 30.0–36.0)
MCV: 94.8 fL (ref 80.0–100.0)
Monocytes Absolute: 0.8 10*3/uL (ref 0.1–1.0)
Monocytes Relative: 12 %
Neutro Abs: 4 10*3/uL (ref 1.7–7.7)
Neutrophils Relative %: 59 %
Platelets: 203 10*3/uL (ref 150–400)
RBC: 4.01 MIL/uL (ref 3.87–5.11)
RDW: 12.1 % (ref 11.5–15.5)
WBC: 6.7 10*3/uL (ref 4.0–10.5)
nRBC: 0 % (ref 0.0–0.2)

## 2021-07-14 LAB — URINE CULTURE: Culture: 40000 — AB

## 2021-07-14 LAB — MAGNESIUM: Magnesium: 2.2 mg/dL (ref 1.7–2.4)

## 2021-07-14 LAB — PROCALCITONIN: Procalcitonin: <0.1 ng/mL

## 2021-07-14 MED ORDER — CITALOPRAM HYDROBROMIDE 20 MG PO TABS: 40.0000 mg | ORAL_TABLET | Freq: Every day | ORAL | Status: AC

## 2021-07-14 MED ORDER — OMEPRAZOLE MAGNESIUM 20 MG PO TBEC: 20.0000 mg | DELAYED_RELEASE_TABLET | Freq: Every day | ORAL | 0 refills | Status: DC

## 2021-07-14 MED ORDER — POTASSIUM CHLORIDE CRYS ER 20 MEQ PO TBCR
40.0000 meq | EXTENDED_RELEASE_TABLET | Freq: Once | ORAL | Status: AC
  Administered 2021-07-14: 40 meq via ORAL
  Filled 2021-07-14: qty 2

## 2021-07-14 NOTE — TOC CM/SW Note (Signed)
  Transition of Care [facility]) Screening Note   Patient Details  Name: Sheryl Bates Date of Birth: 27-Jan-1956   Transition of Care [facility]) CM/SW Contact:    Ross Ludwig, LCSW Phone Number: 07/14/2021, 1:55 PM    Transition of Care Department [facility]) has reviewed patient and no TOC needs have been identified at this time. We will continue to monitor patient advancement through interdisciplinary progression rounds. If new patient transition needs arise, please place a TOC consult.

## 2021-07-14 NOTE — Evaluation (Signed)
Physical Therapy One Time Evaluation Patient Details Name: Sheryl Bates MRN: [id number] DOB: 08/01/1955 Today's Date: 07/14/2021  History of Present Illness  66 y.o. female with medical history significant for essential hypertension, depression, who is admitted to [facility] on 07/13/2021 with generalized weakness.  Clinical Impression  Patient evaluated by Physical Therapy with no further acute PT needs identified. All education has been completed and the patient has no further questions.  Pt reports feeling better today and able to ambulate 400 feet in hallway without unsteadiness or LOB.  Pt anticipates d/c home later today. See below for any follow-up Physical Therapy or equipment needs. PT is signing off. Thank you for this referral.     Recommendations for follow up therapy are one component of a multi-disciplinary discharge planning process, led by the attending physician.  Recommendations may be updated based on patient status, additional functional criteria and insurance authorization.  Follow Up Recommendations No PT follow up    Assistance Recommended at Discharge None  Patient can return home with the following       Equipment Recommendations None recommended by PT  Recommendations for Other Services       Functional Status Assessment Patient has not had a recent decline in their functional status     Precautions / Restrictions Precautions Precautions: Fall      Mobility  Bed Mobility Overal bed mobility: Modified Independent                  Transfers Overall transfer level: Modified independent                      Ambulation/Gait Ambulation/Gait assistance: Supervision Gait Distance (Feet): 400 Feet Assistive device: None Gait Pattern/deviations: WFL(Within Functional Limits)       General Gait Details: denies dizziness with ambulation, no unsteadiness or LOB observed  Stairs            Wheelchair Mobility     Modified Rankin (Stroke Patients Only)       Balance Overall balance assessment: History of Falls (reports only falls were prior to admission due to "jelly" legs and dizziness)                                           Pertinent Vitals/Pain Pain Assessment Pain Assessment: No/denies pain    Home Living Family/patient expects to be discharged to:: Private residence Living Arrangements: Spouse/significant other;Children     Home Access: Level entry       Home Layout: One level Home Equipment: Agricultural consultant (2 wheels)      Prior Function Prior Level of Function : Independent/Modified Independent                     Hand Dominance        Extremity/Trunk Assessment        Lower Extremity Assessment Lower Extremity Assessment: Generalized weakness    Cervical / Trunk Assessment Cervical / Trunk Assessment: Normal  Communication   Communication: No difficulties  Cognition Arousal/Alertness: Awake/alert Behavior During Therapy: WFL for tasks assessed/performed Overall Cognitive Status: Within Functional Limits for tasks assessed  General Comments      Exercises     Assessment/Plan    PT Assessment Patient does not need any further PT services  PT Problem List         PT Treatment Interventions      PT Goals (Current goals can be found in the Care Plan section)  Acute Rehab PT Goals PT Goal Formulation: All assessment and education complete, DC therapy    Frequency       Co-evaluation               AM-PAC PT "6 Clicks" Mobility  Outcome Measure Help needed turning from your back to your side while in a flat bed without using bedrails?: None Help needed moving from lying on your back to sitting on the side of a flat bed without using bedrails?: None Help needed moving to and from a bed to a chair (including a wheelchair)?: None Help needed standing up  from a chair using your arms (e.g., wheelchair or bedside chair)?: None Help needed to walk in hospital room?: A Little Help needed climbing 3-5 steps with a railing? : A Little 6 Click Score: 22    End of Session   Activity Tolerance: Patient tolerated treatment well Patient left: in chair;with call bell/phone within reach;with chair alarm set;with family/visitor present Nurse Communication: Mobility status PT Visit Diagnosis: Difficulty in walking, not elsewhere classified (R26.2)    Time: 1884-1660 PT Time Calculation (min) (ACUTE ONLY): 15 min   Charges:   PT Evaluation $PT Eval Low Complexity: 1 Low        Kati PT, DPT Acute Rehabilitation Services Pager: [phone] Office: [phone]   Janan Halter [place] 07/14/2021, 10:44 AM

## 2021-07-14 NOTE — Discharge Summary (Signed)
Physician Discharge Summary  Cornelia Walraven [place] WUJ:[id number] DOB: 23-Feb-1955  PCP: Marden Noble, MD  Admitted from: Home Discharged to: Home  Admit date: 07/13/2021 Discharge date: 07/14/2021  Recommendations for Outpatient Follow-up:    Follow-up Information     Marden Noble, MD. Schedule an appointment as soon as possible for a visit in 1 week(s).   Specialty: Internal Medicine Why: To be seen with repeat labs (CBC, BMP, magnesium). Contact information: [address] [place] [address] [phone]                  Home Health: None    Equipment/Devices: None    Discharge Condition: Improved and stable.   Code Status: Full Code Diet recommendation:  Discharge Diet Orders (From admission, onward)     Start     Ordered   07/14/21 0000  Diet - low sodium heart healthy        07/14/21 1331             Discharge Diagnoses:  Principal Problem:   Generalized weakness Active Problems:   Hypertension   Hypokalemia   Dehydration   Nausea & vomiting   Frequent falls   Acute cystitis   AKI (acute kidney injury) (HCC)   Depression   Brief Summary: 66 year old married female, independent of daily activities, medical history significant for hypertension, hyperlipidemia, anxiety and depression, endometriosis, who presented to the ED for the second time in a few days with complaints of nausea, vomiting, abdominal pain, poor oral intake, lightheadedness,??  Chest pain.  She had been seen in the ED on 07/07/2021 for similar complaints.  She was assessed for same including for DOE and tachycardia.  EKG showed sinus tachycardia without clear evidence of A-fib.  She was discharged home with recommendations for outpatient cardiology evaluation, patient has upcoming appointment on 07/16/2021 at 8 AM for same.  After that visit, she had initial improvement and then had recurrence of symptoms.  She reports several life stressors with worsening anxiety for  several months, Celexa dose has been doubled, PCP prescribed as needed Xanax but she is hesitant to take it for fear of dependency and has taken 3 to 4 pills over the last 10 days.  As per spouse report, patient has sustained 3 falls in the past week, reportedly preceded by some orthostatic seeming symptoms.  She had a fall even morning prior to this ED visit while walking to the restroom when she felt lightheaded after getting up from bed and felt like she was going to pass out so she tried to get back in bed but could not get back in bed quickly enough and fell to the ground.  Per EDP, hit the back of her head on the ground but no LOC.  Evaluated in ED, given IV fluids, potassium replacement, magnesium replacement, suspected UTI for which given Rocephin, patient felt much better, initially preferred to go home because she did not want to be away from her family.  Patient and spouse expressed concern that some of her symptoms may be attributed to her worsened anxiety.  She was able to tolerate oral intake without nausea or vomiting.  She was ambulating.  Since she was stable, plan was initially to discharge her from the ED.  Subsequently however she reported continuing to feel weak and did not feel like she was able to go home and was admitted for observation.  Assessment and plan:  Intermittent nausea, vomiting, abdominal pain and diarrhea: Symptoms ongoing  for 7 to 10 days PTA.  Denies sickly contacts with similar symptoms or eating out or eating anything unusual.  Could be acute viral GE.  CT abdomen and pelvis 6/2 without acute findings.  Lipase normal.  Although urine microscopy showed rare bacteria, she clearly indicates that she did not have any dysuria, urinary frequency, urgency, fever or chills PTA.  She reports no nausea or vomiting for about 3 days PTA and day of ED visit was able to tolerate some soups, ice cream and banana.  Diarrhea had resolved.  Placed on regular diet which she has tolerated  without difficulty.  If has recurrence of GI symptoms, could consider outpatient GI consultation.  Provided a short course of PPI in case she has some gastritis.  Dehydration/mild AKI: Suspected due to poor oral intake and GI losses.  Received a liter IV fluid bolus followed by maintenance IV fluids.  Clinically euvolemic.  Tolerating oral fluids and food.  Discontinued home HCTZ.  Prerenal mild AKI resolved after IV hydration.  Orthostatic hypotension: Supine BP 134/61, heart rate 75, standing BP 107/55, heart rate 87, standing BP after 3 minutes 99/62, heart rate 99.  Patient on polypharmacy antihypertensives at home which could be contributing in the context of recent dehydration.  Discontinued amlodipine and HCTZ until close outpatient follow-up with PCP.  Encouraged use of bilateral leg compression stockings and counseled patient regarding maneuvers to minimize orthostatic symptoms.  Essential hypertension: As stated above, HCTZ and amlodipine discontinued.  Continue prior home dose of hydralazine, losartan and metoprolol.  Hypokalemia: Aggressively replaced PTA.  Discontinued HCTZ.  Magnesium normal.  Asymptomatic bacteriuria: No UTI symptoms.  Only rare bacteria in urine and 6-10 WBCs per high-power field.  No leukocytosis or fever.  Discontinued empirically started ceftriaxone.  Also called patient's outpatient pharmacy and left VM message for pharmacy to cancel prescriptions called in by EDP including for Vantin, Atarax, Carafate and Klor-Con.  Falls at home: Suspect due to weakness related to dehydration, hypokalemia and orthostatic hypotension.  PT and OT evaluated and made no home recommendations.  CK normal.  High-sensitivity troponin trended and normal.  TSH normal.  Anxiety and depression: As noted above, patient has been dealing with multiple life stressors over the last several months.  Her Celexa dose was doubled and as needed Xanax had been added as outpatient.  Counseled her  regarding holistic approach for anxiety management including regular exercise as tolerated, sleep hygiene, activities that give her enjoyment, music, meditation etc. and strongly recommended follow-up with her PCP to consider outpatient psychiatry referral.  No suicidal or homicidal ideations reported.  For now continue prior home regimen of Celexa and as needed Xanax.  Discontinued Atarax started by EDP to avoid polypharmacy and further complications from symptoms.  GERD: Continue PPI.  Palpitations/tachycardia/dyspnea on exertion: Reported on recent ED visit 5/27.  EKG then showed sinus tachycardia without features of A-fib.  TSH normal.  Continue prior home dose of beta-blockers.  Advised to keep up cardiology appointment on 6/5 at 8 AM.  High-sensitivity troponins cycled and negative.  Low index of suspicion for VTE.  EKG 6/2 personally reviewed, sinus bradycardia at 58 bpm, left axis deviation,??  LAFB, no acute changes and QTc 474 ms.  Last TTE in 2018 showed LVEF of 65-70%, moderate LVH, grade 2 diastolic dysfunction and aortic valve without stenosis.  Will defer to outpatient cardiology regarding repeating echo.   Consultations: None  Procedures: None   Discharge Instructions  Discharge Instructions  Call MD for:  difficulty breathing, headache or visual disturbances   Complete by: As directed    Call MD for:  extreme fatigue   Complete by: As directed    Call MD for:  persistant dizziness or light-headedness   Complete by: As directed    Call MD for:  persistant nausea and vomiting   Complete by: As directed    Call MD for:  severe uncontrolled pain   Complete by: As directed    Call MD for:  temperature >100.4   Complete by: As directed    Diet - low sodium heart healthy   Complete by: As directed    Increase activity slowly   Complete by: As directed         Medication List     STOP taking these medications    amLODipine 5 MG tablet Commonly known as:  NORVASC   cefpodoxime 200 MG tablet Commonly known as: VANTIN   hydrochlorothiazide 25 MG tablet Commonly known as: HYDRODIURIL   promethazine 12.5 MG tablet Commonly known as: PHENERGAN       TAKE these medications    ALPRAZolam 0.5 MG tablet Commonly known as: XANAX Take 0.5 mg by mouth daily as needed for anxiety.   aspirin EC 81 MG tablet Take 81 mg by mouth daily.   Biotin 10 MG Tabs 1 tablet   Centrum Silver 50+Women Tabs See admin instructions.   citalopram 20 MG tablet Commonly known as: CELEXA Take 2 tablets (40 mg total) by mouth daily.   hydrALAZINE 25 MG tablet Commonly known as: APRESOLINE Take 1 tablet (25 mg total) by mouth 3 (three) times daily. What changed: when to take this   losartan 100 MG tablet Commonly known as: COZAAR Take 1 tablet (100 mg total) by mouth daily.   metoprolol tartrate 50 MG tablet Commonly known as: LOPRESSOR Take 1 tablet (50 mg total) by mouth 2 (two) times daily. What changed: when to take this   omeprazole 20 MG tablet Commonly known as: PriLOSEC OTC Take 1 tablet (20 mg total) by mouth daily for 15 days.   ondansetron 4 MG disintegrating tablet Commonly known as: ZOFRAN-ODT Take 1 tablet (4 mg total) by mouth every 8 (eight) hours as needed for nausea or vomiting.   vitamin C 500 MG tablet Commonly known as: ASCORBIC ACID 1 tablet       Allergies  Allergen Reactions   Sulfonamide Derivatives Diarrhea and Nausea Only   Lisinopril Cough      Procedures/Studies:  CT HEAD WO CONTRAST  Result Date: 07/13/2021 CLINICAL DATA:  Fall EXAM: CT HEAD WITHOUT CONTRAST CT CERVICAL SPINE WITHOUT CONTRAST TECHNIQUE: Multidetector CT imaging of the head and cervical spine was performed following the standard protocol without intravenous contrast. Multiplanar CT image reconstructions of the cervical spine were also generated. RADIATION DOSE REDUCTION: This exam was performed according to the departmental  dose-optimization program which includes automated exposure control, adjustment of the mA and/or kV according to patient size and/or use of iterative reconstruction technique. COMPARISON:  MR head 03/16/2016 FINDINGS: CT HEAD FINDINGS Brain: There is no acute intracranial hemorrhage, extra-axial fluid collection, or acute infarct. Parenchymal volume is normal. There is a small remote lacunar infarct in the left caudate head, unchanged since the prior brain MRI. Gray-white differentiation is otherwise preserved. There is no mass lesion.  There is no mass effect or midline shift. Vascular: There is calcification of the bilateral cavernous ICAs. Skull: Normal. Negative for fracture or focal lesion.  Sinuses/Orbits: The paranasal sinuses are clear. The globes and orbits are unremarkable. Other: None. CT CERVICAL SPINE FINDINGS Alignment: There is straightening of the normal cervical lordosis. There is no antero or retrolisthesis. There is no jumped or perched facets or other evidence of traumatic malalignment. Skull base and vertebrae: Skull base alignment is maintained. Vertebral body heights are preserved. There is no evidence of acute fracture. There is no suspicious osseous lesion. Soft tissues and spinal canal: No prevertebral fluid or swelling. No visible canal hematoma. Disc levels: There is disc space narrowing with associated degenerative endplate change most advanced at C4-C5 through C6-C7. Facet arthropathy is most advanced on the right at C3-C4. There is no evidence of high-grade spinal canal stenosis. Upper chest: The imaged lung apices are clear. Other: None. IMPRESSION: 1. No acute intracranial pathology. 2. No acute fracture or traumatic malalignment of the cervical spine. Electronically Signed   By: Lesia Hausen M.D.   On: 07/13/2021 11:27   CT Cervical Spine Wo Contrast  Result Date: 07/13/2021 CLINICAL DATA:  Fall EXAM: CT HEAD WITHOUT CONTRAST CT CERVICAL SPINE WITHOUT CONTRAST TECHNIQUE:  Multidetector CT imaging of the head and cervical spine was performed following the standard protocol without intravenous contrast. Multiplanar CT image reconstructions of the cervical spine were also generated. RADIATION DOSE REDUCTION: This exam was performed according to the departmental dose-optimization program which includes automated exposure control, adjustment of the mA and/or kV according to patient size and/or use of iterative reconstruction technique. COMPARISON:  MR head 03/16/2016 FINDINGS: CT HEAD FINDINGS Brain: There is no acute intracranial hemorrhage, extra-axial fluid collection, or acute infarct. Parenchymal volume is normal. There is a small remote lacunar infarct in the left caudate head, unchanged since the prior brain MRI. Gray-white differentiation is otherwise preserved. There is no mass lesion.  There is no mass effect or midline shift. Vascular: There is calcification of the bilateral cavernous ICAs. Skull: Normal. Negative for fracture or focal lesion. Sinuses/Orbits: The paranasal sinuses are clear. The globes and orbits are unremarkable. Other: None. CT CERVICAL SPINE FINDINGS Alignment: There is straightening of the normal cervical lordosis. There is no antero or retrolisthesis. There is no jumped or perched facets or other evidence of traumatic malalignment. Skull base and vertebrae: Skull base alignment is maintained. Vertebral body heights are preserved. There is no evidence of acute fracture. There is no suspicious osseous lesion. Soft tissues and spinal canal: No prevertebral fluid or swelling. No visible canal hematoma. Disc levels: There is disc space narrowing with associated degenerative endplate change most advanced at C4-C5 through C6-C7. Facet arthropathy is most advanced on the right at C3-C4. There is no evidence of high-grade spinal canal stenosis. Upper chest: The imaged lung apices are clear. Other: None. IMPRESSION: 1. No acute intracranial pathology. 2. No acute  fracture or traumatic malalignment of the cervical spine. Electronically Signed   By: Lesia Hausen M.D.   On: 07/13/2021 11:27   CT ABDOMEN PELVIS W CONTRAST  Result Date: 07/13/2021 CLINICAL DATA:  Abdominal and back pain.  Patient fell last night. EXAM: CT ABDOMEN AND PELVIS WITH CONTRAST TECHNIQUE: Multidetector CT imaging of the abdomen and pelvis was performed using the standard protocol following bolus administration of intravenous contrast. RADIATION DOSE REDUCTION: This exam was performed according to the departmental dose-optimization program which includes automated exposure control, adjustment of the mA and/or kV according to patient size and/or use of iterative reconstruction technique. CONTRAST:  OMNIPAQUE IOHEXOL 300 MG/ML  SOLN COMPARISON:  12/15/2017. FINDINGS: Lower  chest: Clear lung bases. Hepatobiliary: Decreased attenuation of the liver consistent with fatty infiltration. Normal liver size. No mass. No contusion or laceration. Normal gallbladder. No bile duct dilation. Pancreas: Normal. Spleen: Normal. Adrenals/Urinary Tract: No adrenal mass or hemorrhage. Kidneys normal size, orientation and position. Symmetric renal enhancement and excretion. No contusion or laceration. No mass. 5 mm nonobstructing midpole stone on the left. No hydronephrosis. Normal ureters. Normal bladder. Stomach/Bowel: Small hiatal hernia. Stomach otherwise unremarkable. Small bowel and colon are normal caliber. No wall thickening. No inflammation. Multiple colonic diverticula most evident on the left. Normal appendix. No evidence of bowel or mesenteric injury. Vascular/Lymphatic: No vascular injury. Mild aortic atherosclerosis. No aneurysm. No enlarged lymph nodes. Reproductive: Uterus and bilateral adnexa are unremarkable. Other: No abdominal wall hernia or abnormality. No abdominopelvic ascites. Musculoskeletal: No fracture. No bone lesion. Degenerative changes throughout the lumbar spine with loss of disc  height, endplate spurring and Schmorl's nodes. Facet degenerative changes in the lower lumbar spine. Grade 1 anterolisthesis of L4 on L5. IMPRESSION: 1. No acute findings within the abdomen or pelvis. No evidence of injury. 2. Degenerative changes throughout the lumbar spine. 3. Mild aortic atherosclerosis. 5 mm nonobstructing stone in the left kidney. 4. Hepatic steatosis. Electronically Signed   By: Amie Portlandavid  Ormond M.D.   On: 07/13/2021 11:23   DG Chest Portable 1 View  Result Date: 07/13/2021 CLINICAL DATA:  fall, cp dib EXAM: PORTABLE CHEST 1 VIEW COMPARISON:  Chest radiograph Jul 07, 2021. FINDINGS: No consolidation. No visible pleural effusions or pneumothorax. Cardiomediastinal silhouette is within normal limits. No displaced fracture. IMPRESSION: No evidence of acute cardiopulmonary disease. Electronically Signed   By: Feliberto HartsFrederick S Jones M.D.   On: 07/13/2021 10:31      Subjective: Patient seen along with her husband and young daughter at bedside.  Overall feels much better.  Both patient and spouse indicate that she has not had any nausea or vomiting for almost 3 days.  Patient has tried to push some food/fluids at home, "I was trying to keep my potassium up".  She tolerated some ice cream, soup and banana yesterday at home.  No real diarrhea, very little stool output and feels that this is because she has not been eating much.  Specifically denied chest pain, dyspnea yesterday or today including with activity with PT.  No dizziness or lightheadedness.  Reiterates ongoing anxiety issues.  Discharge Exam:  Vitals:   07/14/21 0025 07/14/21 0405 07/14/21 0542 07/14/21 0921  BP: (!) 110/59 111/61  134/66  Pulse: 66 65  63  Resp: 16 16  20   Temp: 97.9 F (36.6 C) 97.6 F (36.4 C)  (!) 97.5 F (36.4 C)  TempSrc: Oral Oral  Oral  SpO2: 94% 97%  97%  Weight:   84.4 kg   Height:        General: Pleasant middle-age female, moderately built and obese sitting up comfortably in chair without  distress.  Oral mucosa moist. Cardiovascular: S1 & S2 heard, RRR, S1/S2 +. No murmurs, rubs, gallops or clicks. No JVD or pedal edema.  Telemetry personally reviewed: Sinus rhythm without arrhythmias. Respiratory: Clear to auscultation without wheezing, rhonchi or crackles. No increased work of breathing. Abdominal:  Non distended, non tender & soft. No organomegaly or masses appreciated. Normal bowel sounds heard. CNS: Alert and oriented. No focal deficits. Extremities: no edema, no cyanosis    The results of significant diagnostics from this hospitalization (including imaging, microbiology, ancillary and laboratory) are listed below for reference.  Microbiology: Recent Results (from the past 240 hour(s))  Urine Culture     Status: None (Preliminary result)   Collection Time: 07/13/21  9:40 AM   Specimen: Urine, Clean Catch  Result Value Ref Range Status   Specimen Description   Final    URINE, CLEAN CATCH Performed at [facility], [address] [place], [place] [address]    Special Requests   Final    NONE Performed at [facility], [address] [place], [place] [address]    Culture   Final    CULTURE REINCUBATED FOR BETTER GROWTH Performed at [facility], [address], [place], [place] [address]    Report Status PENDING  Incomplete  Resp Panel by RT-PCR (Flu A&B, Covid) Anterior Nasal Swab     Status: None   Collection Time: 07/13/21 10:18 AM   Specimen: Anterior Nasal Swab  Result Value Ref Range Status   SARS Coronavirus 2 by RT PCR NEGATIVE NEGATIVE Final    Comment: (NOTE) SARS-CoV-2 target nucleic acids are NOT DETECTED.  The SARS-CoV-2 RNA is generally detectable in upper respiratory specimens during the acute phase of infection. The lowest concentration of SARS-CoV-2 viral copies this assay can detect is 138 copies/mL. A negative result does not preclude SARS-Cov-2 infection and should not be used as the  sole basis for treatment or other patient management decisions. A negative result may occur with  improper specimen collection/handling, submission of specimen other than nasopharyngeal swab, presence of viral mutation(s) within the areas targeted by this assay, and inadequate number of viral copies(<138 copies/mL). A negative result must be combined with clinical observations, patient history, and epidemiological information. The expected result is Negative.  Fact Sheet for Patients:  BloggerCourse.com  Fact Sheet for Healthcare Providers:  SeriousBroker.it  This test is no t yet approved or cleared by the [place] FDA and  has been authorized for detection and/or diagnosis of SARS-CoV-2 by FDA under an Emergency Use Authorization (EUA). This EUA will remain  in effect (meaning this test can be used) for the duration of the COVID-19 declaration under Section 564(b)(1) of the Act, 21 U.S.C.section 360bbb-3(b)(1), unless the authorization is terminated  or revoked sooner.       Influenza A by PCR NEGATIVE NEGATIVE Final   Influenza B by PCR NEGATIVE NEGATIVE Final    Comment: (NOTE) The Xpert Xpress SARS-CoV-2/FLU/RSV plus assay is intended as an aid in the diagnosis of influenza from Nasopharyngeal swab specimens and should not be used as a sole basis for treatment. Nasal washings and aspirates are unacceptable for Xpert Xpress SARS-CoV-2/FLU/RSV testing.  Fact Sheet for Patients: BloggerCourse.com  Fact Sheet for Healthcare Providers: SeriousBroker.it  This test is not yet approved or cleared by the [place] FDA and has been authorized for detection and/or diagnosis of SARS-CoV-2 by FDA under an Emergency Use Authorization (EUA). This EUA will remain in effect (meaning this test can be used) for the duration of the COVID-19 declaration under Section 564(b)(1) of the  Act, 21 U.S.C. section 360bbb-3(b)(1), unless the authorization is terminated or revoked.  Performed at [facility], [address] [place], [place] [address]      Labs: CBC: Recent Labs  Lab 07/13/21 0940 07/13/21 1239 07/14/21 0539  WBC 9.8  --  6.7  NEUTROABS  --   --  4.0  HGB 15.7* 15.3* 12.8  HCT 45.4 45.0 38.0  MCV 89.0  --  94.8  PLT 314  --  203    Basic Metabolic Panel: Recent Labs  Lab 07/13/21 0940 07/13/21 1239 07/13/21 2045 07/14/21 0539  NA 134* 131*  --  136  K 2.9* 3.9  --  3.2*  CL 92*  --   --  104  CO2 25  --   --  26  GLUCOSE 131*  --   --  94  BUN 26*  --   --  16  CREATININE 1.01*  --   --  0.76  CALCIUM 10.3  --   --  8.5*  MG  --   --  2.2 2.2    Liver Function Tests: Recent Labs  Lab 07/13/21 0940 07/14/21 0539  AST 23 17  ALT 26 21  ALKPHOS 65 52  BILITOT 1.3* 0.9  PROT 7.1 5.1*  ALBUMIN 4.5 3.1*    CBG: Recent Labs  Lab 07/13/21 0936  GLUCAP 142*     Urinalysis    Component Value Date/Time   COLORURINE COLORLESS (A) 07/13/2021 0940   APPEARANCEUR CLEAR 07/13/2021 0940   LABSPEC 1.025 07/13/2021 0940   PHURINE 6.5 07/13/2021 0940   GLUCOSEU NEGATIVE 07/13/2021 0940   HGBUR NEGATIVE 07/13/2021 0940   BILIRUBINUR NEGATIVE 07/13/2021 0940   BILIRUBINUR n 07/19/2013 1200   KETONESUR 15 (A) 07/13/2021 0940   PROTEINUR NEGATIVE 07/13/2021 0940   UROBILINOGEN 0.2 07/19/2013 1200   UROBILINOGEN 0.2 12/31/2011 1629   NITRITE NEGATIVE 07/13/2021 0940   LEUKOCYTESUR MODERATE (A) 07/13/2021 0940    Discussed in detail with patient's spouse at bedside, updated care and answered all questions.  Time coordinating discharge: 35 minutes  SIGNED:  Marcellus Scott, MD,  FACP, [facility], [facility], [facility] (Care Management Physician Certified). [facility]  To contact the attending provider between 7A-7P or the covering provider during after hours 7P-7A, please log into the web site  [email or web address] and access using universal [place] password for that web site. If you do not have the password, please call the hospital operator.

## 2021-07-14 NOTE — Progress Notes (Signed)
OT Cancellation Note  Patient Details Name: Sheryl Bates MRN: [id number] DOB: 1955/09/18   Cancelled Treatment:    Reason Eval/Treat Not Completed: OT screened, no needs identified, will sign off.   Aeneas Longsworth L Zan Triska 07/14/2021, 10:33 AM

## 2021-07-14 NOTE — Progress Notes (Signed)
Pt discharged home. IV removed. VSS. PIV removed. Ted hose applied to patient prior to discharge. Educational teaching complete with teach back method. Pt verbalized understanding. No further questions at this time.

## 2021-07-15 LAB — CALCIUM, IONIZED: Calcium, Ionized, Serum: 5 mg/dL (ref 4.5–5.6)

## 2021-07-15 NOTE — Progress Notes (Signed)
Primary Care Provider: Marden Noble, MD [facility] Cardiologist: Bryan Lemma, MD Electrophysiologist: None  Clinic Note: Chief Complaint  Patient presents with   New Patient (Initial Visit)    Dyspnea on exertion; tachycardia   Hypertension    On multiple medications.   ===================================  ASSESSMENT/PLAN   Problem List Items Addressed This Visit       Cardiology Problems   Resistant hypertension (Chronic)    Not limited to 2-year for resistant hypertension she is on 4 medications but not a diuretic.  Right now with her having issues of dehydration etc., would probably avoid diuretic until more stabilized, but would probably benefit from consolidating medications and adding low-dose diuretic to losartan.      Relevant Medications   amLODipine (NORVASC) 5 MG tablet   Other Relevant Orders   EKG 12-Lead (Completed)   ECHOCARDIOGRAM COMPLETE   Cardiopulmonary exercise test   Cardiac Stress Test: Informed Consent Details: Physician/Practitioner Attestation; Transcribe to consent form and obtain patient signature   Hyperlipidemia (Chronic)    She does have a family history of CAD but later on in the life.  She herself has hypertension hyperlipidemia as risk factors.  In fact her blood pressure seems to be somewhat resistant.  She is not currently on any therapy for her lipids.  Most recent LDL was 104.  With her having exertional dyspnea symptoms need to differentiate between ischemic cardiac disease and pulmonary disease versus deconditioning.  We will evaluate with a CPX, and pending results may want to consider coronary calcium scoring for further risk stratification versus more direct ischemic evaluation.      Relevant Medications   amLODipine (NORVASC) 5 MG tablet     Other   Dehydration    Felt to be dehydrated with poor nutrition following several days of loose stools and nausea etc.  Gradually building back, but still somewhat weak. I  suspect this is the main trigger for tachycardia      Relevant Orders   EKG 12-Lead (Completed)   ECHOCARDIOGRAM COMPLETE   Cardiopulmonary exercise test   Nausea & vomiting    This was the main initiating symptoms that triggered normal evaluation.  Seems to be stabilized now.      DOE (dyspnea on exertion) - Primary    Exercise intolerance/fatigue or dyspnea.  Need to determine/differentiate between cardiac and pulmonary disease.  Plan: Check CPX (cardiopulmonary stress test.),  And 2D echo   .Shared Decision Making/Informed Consent The risks [chest pain, shortness of breath, cardiac arrhythmias, dizziness, blood pressure fluctuations, myocardial infarction, stroke/transient ischemic attack, and life-threatening complications (estimated to be 1 in 10,000)], benefits (risk stratification, diagnosing coronary artery disease, treatment guidance) and alternatives of an exercise tolerance test were discussed in detail with Sheryl Bates and she agrees to proceed.      Relevant Orders   EKG 12-Lead (Completed)   ECHOCARDIOGRAM COMPLETE   Cardiopulmonary exercise test   Cardiac Stress Test: Informed Consent Details: Physician/Practitioner Attestation; Transcribe to consent form and obtain patient signature   Sinus tachycardia by electrocardiogram    Heart rate seems pretty stable today.  Her tachycardia spells were not happening with routine frequency and therefore I do not know that a monitor would actually be helpful.  Her heart rate was fast while in the hospital while she is deconditioned and dehydrated.  I suspect that may be the main reason for the tachypalpitations.  Consider Kardia Mobile  Would like to see her heart rate response to exercise: I have  ordered CPX      Relevant Orders   EKG 12-Lead (Completed)   ECHOCARDIOGRAM COMPLETE   Cardiopulmonary exercise test   Cardiac Stress Test: Informed Consent Details: Physician/Practitioner Attestation; Transcribe to consent form  and obtain patient signature    ===================================  HPI:    Sheryl Bates is a 66 y.o. female history of hypertension (hypertensive urgency) who is being seen today for the evaluation of TACHYCARDIA & ORTHOSTATIC HYPOTENSION (in the setting of poor p.o. intake, nausea vomiting and diarrhea for several days) along with DYSPNEA ON EXERTION at the request of Derwood Kaplan, MD.  Noncardiac medical history includes endometriosis, nephrolithiasis  Sheryl Bates was recently seen by Dr. Johnella Moloney April 23 2021:  Recent Hospitalizations:  [facility] ER visit 07/07/2021-nausea vomiting and diarrhea.  She also had generalized abdominal discomfort she was noted to be tachycardic in the 150s.  Noted some exertional dyspnea for several weeks.  Dyspnea with climbing steps.  No sensation of chest pain or palpitations however.  => Because of some complaints of exertional dyspnea and tachycardia that was "not explained "(despite being easily explained by volume depletion)-referred for cardiology evaluation, management Treated with IV fluids and antiemetics.  Discharged home [facility]night [facility] June 2-3, 2023-transferred from [facility] ER: Again noted generalized weakness for the last 7 to 10 days.  Never really got better after ER visit.  Noted intermittent nausea with emesis.  No subjective fevers or sick contacts.  Multiple mechanical ground-level falls over the course of the last week while trying to ambulate.  No LOC or head trauma. => Noted poor p.o. intake since ER visit Given IV antibiotics and IV fluids for potential treatment of UTI. Patient had been prescribed but not taking PRN Xanax along with her Celexa for stress and anxiety. Husband reported 3 falls in the past week.  Usually associate with orthostatic symptoms.  (Last fall was in the morning prior to ER visit-walking to the restroom after getting up from bed.   No no true LOC Profoundly orthostatic on discharge:  Supine BP 134/61, heart rate 75, standing BP 107/55, heart rate 87, standing BP after 3 minutes 99/62, heart rate 99.  Patient on polypharmacy antihypertensives at home which could be contributing in the context of recent dehydration.  Discontinued amlodipine and HCTZ until close outpatient follow-up with PCP.  Continued on dose of Lopressor, losartan and hydralazine.   Not felt to be due to UTI-asymptomatic bacteriuria. Multiple home medications discontinued  Reviewed  CV studies:    The following studies were reviewed today: (if available, images/films reviewed: From Epic Chart or Care Everywhere) TTE 03/17/2016: EF 65 to 70%.  Moderate LVH with GR 2 DD (but normal Left Atrial size).  Otherwise normal valves, atrial sizes and RV size/function.  Normal RVP and CVP.  Interval History:   Sheryl Bates presents for follow-up after ER visit.  She notes generalized low energy level.  Significant anxiety.  When she went to the ER she felt as though her heart was beating out of control.  She did note that she has been under a lot of stress member having episodes where she would feel jittery and difficulty speaking.  The past has been able to mow her lawn, and doing so now she has to stop several times due to dyspnea, relatively small size lawn.  She also notes symptoms of feeling short of breath going up steps or shortness Of breath.  She has not not  necessarily noted chest pain or pressure with exertion. She also noted generalized leg weakness for trivial leg swelling for the last 2 to 3 weeks leading up to the ER visit.  She has had several falls since hospitalization where her legs just "give out on her.  She feels just weak in general.  She has had episodes where she felt like she would just pass out/blackout, but would not fully go out.  She is also once in a while has not jaw discomfort but not with exertion.  CV Review of Symptoms  (Summary) Cardiovascular ROS: positive for - dyspnea on exertion, edema, irregular heartbeat, palpitations, rapid heart rate, and exercise intolerance, fatigue; leg weakness; may have near syncope negative for - chest pain, murmur, orthopnea, paroxysmal nocturnal dyspnea, shortness of breath, or TIA/amaurosis fugax, claudication  REVIEWED OF SYSTEMS   Review of Systems  Constitutional:  Positive for malaise/fatigue.       General sense of being ill at ease.  HENT:  Negative for congestion and nosebleeds.   Respiratory:  Positive for shortness of breath (Per HPI). Negative for cough.   Cardiovascular:  Positive for leg swelling.       Per HPI  Gastrointestinal:  Positive for abdominal pain, heartburn and nausea. Negative for blood in stool.  Genitourinary:  Negative for hematuria.  Musculoskeletal:  Positive for joint pain and myalgias.  Neurological:  Positive for dizziness, focal weakness (Legs get weak and give out on her.) and weakness (Generalized weakness.).  Endo/Heme/Allergies:  Does not bruise/bleed easily.  Psychiatric/Behavioral:  Positive for depression. The patient is nervous/anxious and has insomnia.    I have reviewed and (if needed) personally updated the patient's problem list, medications, allergies, past medical and surgical history, social and family history.   PAST MEDICAL HISTORY   Past Medical History:  Diagnosis Date   Allergy    CIN I (cervical intraepithelial neoplasia I)    Endometriosis    Hyperlipidemia    Hypertension    Kidney stone    Ovarian cyst    right   Vitamin D deficiency     PAST SURGICAL HISTORY   Past Surgical History:  Procedure Laterality Date   CERVICAL BIOPSY  W/ LOOP ELECTRODE EXCISION     COLPOSCOPY     CYSTOSCOPY/RETROGRADE/URETEROSCOPY/STONE EXTRACTION WITH BASKET Left 12/20/2017   Procedure: CYSTOSCOPY/RETROGRADE/URETEROSCOPY/ LEFT URETERAL STENT PLACEMENT;  Surgeon: Malen Gauze, MD;  Location: [facility];  Service:  Urology;  Laterality: Left;   ESOPHAGOGASTRODUODENOSCOPY N/A 03/18/2016   Procedure: ESOPHAGOGASTRODUODENOSCOPY (EGD);  Surgeon: Bernette Redbird, MD;  Location: [facility];  Service: Endoscopy;  Laterality: N/A;   IR GENERIC HISTORICAL  03/15/2016   IR EPIDUROGRAPHY 03/15/2016 [facility]   KNEE SURGERY     Right   LAPAROSCOPY  1996   LEEP  1992   PELVIC LAPAROSCOPY      Immunization History  Administered Date(s) Administered   Td 07/06/2009    MEDICATIONS/ALLERGIES   Current Meds  Medication Sig   ALPRAZolam (XANAX) 0.5 MG tablet Take 0.5 mg by mouth daily as needed for anxiety.   amLODipine (NORVASC) 5 MG tablet Take 5 mg by mouth daily.   aspirin EC 81 MG tablet Take 81 mg by mouth daily.   Biotin 10 MG TABS 1 tablet   citalopram (CELEXA) 20 MG tablet Take 2 tablets (40 mg total) by mouth daily.   hydrALAZINE (APRESOLINE) 25 MG tablet Take 1 tablet (25 mg total) by mouth 3 (three) times daily. (Patient taking differently: Take  25 mg by mouth 2 (two) times daily.)   losartan (COZAAR) 100 MG tablet Take 1 tablet (100 mg total) by mouth daily.   metoprolol tartrate (LOPRESSOR) 50 MG tablet Take 1 tablet (50 mg total) by mouth 2 (two) times daily. (Patient taking differently: Take 50 mg by mouth daily.)   Multiple Vitamins-Minerals (CENTRUM SILVER 50+WOMEN) TABS See admin instructions.   omeprazole (PRILOSEC OTC) 20 MG tablet Take 1 tablet (20 mg total) by mouth daily for 15 days.   vitamin C (ASCORBIC ACID) 500 MG tablet 1 tablet    Allergies  Allergen Reactions   Sulfonamide Derivatives Diarrhea and Nausea Only   Lisinopril Cough    SOCIAL HISTORY/FAMILY HISTORY   Reviewed in Epic:   Social History   Tobacco Use   Smoking status: Former    Types: Cigarettes    Quit date: 05/03/2005    Years since quitting: 16.2   Smokeless tobacco: Never  Vaping Use   Vaping Use: Never used  Substance Use Topics   Alcohol use: No    Comment: rare   Drug use: No   Social History    Social History Narrative   She is now raising a step-granddaughter (planning to adopt as of 07/19/2013)      Lots of social stress of late.   Family History  Problem Relation Age of Onset   Hypertension Mother    Heart disease Mother 10576       Presumed   Diabetes Mother    Heart disease Father 678       CHF   Breast cancer Neg Hx     OBJCTIVE -PE, EKG, labs   Wt Readings from Last 3 Encounters:  07/16/21 181 lb 3.2 oz (82.2 kg)  07/14/21 186 lb 1.1 oz (84.4 kg)  07/07/21 180 lb (81.6 kg)    Physical Exam: BP 124/72   Pulse 73   Ht 5\' 3"  (1.6 m)   Wt 181 lb 3.2 oz (82.2 kg)   SpO2 98%   BMI 32.10 kg/m  Physical Exam   Adult ECG Report  Rate: 73 ;  Rhythm: normal sinus rhythm and left anterior fascicular block, nonspecific ST-T wave changes with questionable pulmonary pattern. ;   Narrative Interpretation: Stable  EKG from the ER on 07/07/2021 and hospitalization on 6/2-04/2021 evaluated.  There is 1 with a heart rate is 150 beats a minute as read as atrial tachycardia where there are clear P waves.  Follow-up EKGs are read as ectopic atrial rhythm again with her clear normal P waves. => Probably sinus tachycardia and sinus bradycardia respectively.  Recent Labs: Reviewed 07/19/2020: TC 182, TG 95, HDL 61, LDL 104. Lab Results  Component Value Date   CHOL 172 07/19/2013   HDL 57.40 07/19/2013   LDLCALC 98 07/19/2013   TRIG 82.0 07/19/2013   CHOLHDL 3 07/19/2013   Lab Results  Component Value Date   CREATININE 0.76 07/14/2021   CREATININE 1.01 (H) 07/13/2021   CREATININE 0.71 07/07/2021   Lab Results  Component Value Date   CREATININE 0.76 07/14/2021   BUN 16 07/14/2021   NA 136 07/14/2021   K 3.2 (L) 07/14/2021   CL 104 07/14/2021   CO2 26 07/14/2021      Latest Ref Rng & Units 07/14/2021    5:39 AM 07/13/2021   12:39 PM 07/13/2021    9:40 AM  CBC  WBC 4.0 - 10.5 K/uL 6.7   9.8   Hemoglobin 12.0 - 15.0 g/dL 12.8  15.3  15.7   Hematocrit 36.0 - 46.0 % 38.0   45.0  45.4   Platelets 150 - 400 K/uL 203   314     No results found for: "HGBA1C" Lab Results  Component Value Date   TSH 0.520 07/07/2021    ==================================================  COVID-19 Education: The signs and symptoms of COVID-19 were discussed with the patient and how to seek care for testing (follow up with PCP or arrange E-visit).    I spent a total of 29 minutes with the patient spent in direct patient consultation.  Additional time spent with chart review  / charting (studies, outside notes, etc): 24 min Total Time: 53 min  Current medicines are reviewed at length with the patient today.  (+/- concerns) N/A  Notice: This dictation was prepared with Dragon dictation along with smart phrase technology. Any transcriptional errors that result from this process are unintentional and may not be corrected upon review.   Studies Ordered:  Orders Placed This Encounter  Procedures   Cardiopulmonary exercise test   Cardiac Stress Test: Informed Consent Details: Physician/Practitioner Attestation; Transcribe to consent form and obtain patient signature   EKG 12-Lead   ECHOCARDIOGRAM COMPLETE   No orders of the defined types were placed in this encounter.   Patient Instructions / Medication Changes & Studies & Tests Ordered   Patient Instructions  Medication Instructions:  No changes   *If you need a refill on your cardiac medications before your next appointment, please call your pharmacy*   Lab Work:  Not needed   Testing/Procedures:   Will be schedule at [address] or Therapist, music at [employer] Your physician has requested that you have an echocardiogram. Echocardiography is a painless test that uses sound waves to create images of your heart. It provides your doctor with information about the size and shape of your heart and how well your heart's chambers and valves are working. This procedure takes approximately one hour.  There are no restrictions for this procedure.   And Will be schedule at [facility] at [facility] [address] - Entrance c Your physician has recommended that you have a cardiopulmonary stress test (CPX). CPX testing is a non-invasive measurement of heart and lung function. It replaces a traditional treadmill stress test. This type of test provides a tremendous amount of information that relates not only to your present condition but also for future outcomes. This test combines measurements of you ventilation, respiratory gas exchange in the lungs, electrocardiogram (EKG), blood pressure and physical response before, during, and following an exercise protocol.   Follow-Up: At [facility], you and your health needs are our priority.  As part of our continuing mission to provide you with exceptional heart care, we have created designated Provider Care Teams.  These Care Teams include your primary Cardiologist (physician) and Advanced Practice Providers (APPs -  Physician Assistants and Nurse Practitioners) who all work together to provide you with the care you need, when you need it.     Your next appointment:   2 month(s)  The format for your next appointment:   In Person  Provider:   Bryan Lemma, MD        Bryan Lemma, M.D., M.S. Interventional Cardiologist   Pager # [phone] Phone # [phone] [address] [place], [place] [address]   Thank you for choosing Heartcare at [facility]!!

## 2021-07-16 ENCOUNTER — Ambulatory Visit: Payer: Medicare HMO | Admitting: Cardiology

## 2021-07-16 ENCOUNTER — Encounter: Payer: Self-pay | Admitting: Cardiology

## 2021-07-16 VITALS — BP 124/72 | HR 73 | Ht 63.0 in | Wt 181.2 lb

## 2021-07-16 DIAGNOSIS — E860 Dehydration: Secondary | ICD-10-CM

## 2021-07-16 DIAGNOSIS — E785 Hyperlipidemia, unspecified: Secondary | ICD-10-CM | POA: Diagnosis not present

## 2021-07-16 DIAGNOSIS — R0609 Other forms of dyspnea: Secondary | ICD-10-CM | POA: Diagnosis not present

## 2021-07-16 DIAGNOSIS — R000 Tachycardia, unspecified: Secondary | ICD-10-CM | POA: Diagnosis not present

## 2021-07-16 DIAGNOSIS — I10 Essential (primary) hypertension: Secondary | ICD-10-CM

## 2021-07-16 DIAGNOSIS — R112 Nausea with vomiting, unspecified: Secondary | ICD-10-CM | POA: Diagnosis not present

## 2021-07-16 DIAGNOSIS — I160 Hypertensive urgency: Secondary | ICD-10-CM

## 2021-07-16 LAB — METHYLMALONIC ACID, SERUM: Methylmalonic Acid, Quantitative: 81 nmol/L (ref 0–378)

## 2021-07-16 NOTE — Patient Instructions (Addendum)
Medication Instructions:  No changes   *If you need a refill on your cardiac medications before your next appointment, please call your pharmacy*   Lab Work:  Not needed   Testing/Procedures:   Will be schedule at [address] or Therapist, music at [employer] Your physician has requested that you have an echocardiogram. Echocardiography is a painless test that uses sound waves to create images of your heart. It provides your doctor with information about the size and shape of your heart and how well your heart's chambers and valves are working. This procedure takes approximately one hour. There are no restrictions for this procedure.   And Will be schedule at [facility] at [facility] [address] - Entrance c Your physician has recommended that you have a cardiopulmonary stress test (CPX). CPX testing is a non-invasive measurement of heart and lung function. It replaces a traditional treadmill stress test. This type of test provides a tremendous amount of information that relates not only to your present condition but also for future outcomes. This test combines measurements of you ventilation, respiratory gas exchange in the lungs, electrocardiogram (EKG), blood pressure and physical response before, during, and following an exercise protocol.   Follow-Up: At [facility], you and your health needs are our priority.  As part of our continuing mission to provide you with exceptional heart care, we have created designated Provider Care Teams.  These Care Teams include your primary Cardiologist (physician) and Advanced Practice Providers (APPs -  Physician Assistants and Nurse Practitioners) who all work together to provide you with the care you need, when you need it.     Your next appointment:   2 month(s)  The format for your next appointment:   In Person  Provider:   Bryan Lemma, MD

## 2021-07-20 DIAGNOSIS — R69 Illness, unspecified: Secondary | ICD-10-CM | POA: Diagnosis not present

## 2021-07-20 DIAGNOSIS — F439 Reaction to severe stress, unspecified: Secondary | ICD-10-CM | POA: Diagnosis not present

## 2021-07-20 DIAGNOSIS — I10 Essential (primary) hypertension: Secondary | ICD-10-CM | POA: Diagnosis not present

## 2021-07-20 DIAGNOSIS — F419 Anxiety disorder, unspecified: Secondary | ICD-10-CM | POA: Diagnosis not present

## 2021-07-21 ENCOUNTER — Encounter: Payer: Self-pay | Admitting: Cardiology

## 2021-07-21 NOTE — Assessment & Plan Note (Signed)
She does have a family history of CAD but later on in the life.  She herself has hypertension hyperlipidemia as risk factors.  In fact her blood pressure seems to be somewhat resistant.  She is not currently on any therapy for her lipids.  Most recent LDL was 104.  With her having exertional dyspnea symptoms need to differentiate between ischemic cardiac disease and pulmonary disease versus deconditioning.  We will evaluate with a CPX, and pending results may want to consider coronary calcium scoring for further risk stratification versus more direct ischemic evaluation.

## 2021-07-21 NOTE — Assessment & Plan Note (Signed)
Exercise intolerance/fatigue or dyspnea.  Need to determine/differentiate between cardiac and pulmonary disease.  Plan: Check CPX (cardiopulmonary stress test.),  And 2D echo   .Shared Decision Making/Informed Consent The risks [chest pain, shortness of breath, cardiac arrhythmias, dizziness, blood pressure fluctuations, myocardial infarction, stroke/transient ischemic attack, and life-threatening complications (estimated to be 1 in 10,000)], benefits (risk stratification, diagnosing coronary artery disease, treatment guidance) and alternatives of an exercise tolerance test were discussed in detail with Ms. Shishido and she agrees to proceed.

## 2021-07-21 NOTE — Assessment & Plan Note (Signed)
Felt to be dehydrated with poor nutrition following several days of loose stools and nausea etc.  Gradually building back, but still somewhat weak. I suspect this is the main trigger for tachycardia

## 2021-07-21 NOTE — Assessment & Plan Note (Signed)
Not limited to 2-year for resistant hypertension she is on 4 medications but not a diuretic.  Right now with her having issues of dehydration etc., would probably avoid diuretic until more stabilized, but would probably benefit from consolidating medications and adding low-dose diuretic to losartan.

## 2021-07-21 NOTE — Assessment & Plan Note (Signed)
This was the main initiating symptoms that triggered normal evaluation.  Seems to be stabilized now.

## 2021-07-21 NOTE — Assessment & Plan Note (Addendum)
Heart rate seems pretty stable today.  Her tachycardia spells were not happening with routine frequency and therefore I do not know that a monitor would actually be helpful.  Her heart rate was fast while in the hospital while she is deconditioned and dehydrated.  I suspect that may be the main reason for the tachypalpitations.  Consider Kardia Mobile  Would like to see her heart rate response to exercise: I have ordered CPX

## 2021-07-27 ENCOUNTER — Ambulatory Visit (INDEPENDENT_AMBULATORY_CARE_PROVIDER_SITE_OTHER): Payer: Medicare HMO

## 2021-07-27 DIAGNOSIS — I1A0 Resistant hypertension: Secondary | ICD-10-CM

## 2021-07-27 DIAGNOSIS — E860 Dehydration: Secondary | ICD-10-CM | POA: Diagnosis not present

## 2021-07-27 DIAGNOSIS — I10 Essential (primary) hypertension: Secondary | ICD-10-CM

## 2021-07-27 DIAGNOSIS — R000 Tachycardia, unspecified: Secondary | ICD-10-CM

## 2021-07-27 DIAGNOSIS — R0609 Other forms of dyspnea: Secondary | ICD-10-CM

## 2021-07-27 HISTORY — PX: TRANSTHORACIC ECHOCARDIOGRAM: SHX275

## 2021-07-27 LAB — ECHOCARDIOGRAM COMPLETE
AR max vel: 2.36 cm2
AV Area VTI: 2.57 cm2
AV Area mean vel: 2.57 cm2
AV Mean grad: 3 mmHg
AV Peak grad: 6.7 mmHg
Ao pk vel: 1.29 m/s
Area-P 1/2: 3.05 cm2
S' Lateral: 2.34 cm

## 2021-08-06 DIAGNOSIS — N200 Calculus of kidney: Secondary | ICD-10-CM | POA: Diagnosis not present

## 2021-08-06 DIAGNOSIS — Z23 Encounter for immunization: Secondary | ICD-10-CM | POA: Diagnosis not present

## 2021-08-06 DIAGNOSIS — I10 Essential (primary) hypertension: Secondary | ICD-10-CM | POA: Diagnosis not present

## 2021-08-06 DIAGNOSIS — F439 Reaction to severe stress, unspecified: Secondary | ICD-10-CM | POA: Diagnosis not present

## 2021-08-06 DIAGNOSIS — K5730 Diverticulosis of large intestine without perforation or abscess without bleeding: Secondary | ICD-10-CM | POA: Diagnosis not present

## 2021-08-06 DIAGNOSIS — F322 Major depressive disorder, single episode, severe without psychotic features: Secondary | ICD-10-CM | POA: Diagnosis not present

## 2021-08-06 DIAGNOSIS — Z0000 Encounter for general adult medical examination without abnormal findings: Secondary | ICD-10-CM | POA: Diagnosis not present

## 2021-08-06 DIAGNOSIS — R69 Illness, unspecified: Secondary | ICD-10-CM | POA: Diagnosis not present

## 2021-08-06 DIAGNOSIS — I700 Atherosclerosis of aorta: Secondary | ICD-10-CM | POA: Diagnosis not present

## 2021-08-06 DIAGNOSIS — E559 Vitamin D deficiency, unspecified: Secondary | ICD-10-CM | POA: Diagnosis not present

## 2021-08-09 ENCOUNTER — Ambulatory Visit (HOSPITAL_COMMUNITY): Payer: Medicare HMO | Attending: Internal Medicine

## 2021-08-09 DIAGNOSIS — R000 Tachycardia, unspecified: Secondary | ICD-10-CM | POA: Insufficient documentation

## 2021-08-09 DIAGNOSIS — E860 Dehydration: Secondary | ICD-10-CM | POA: Diagnosis not present

## 2021-08-09 DIAGNOSIS — I10 Essential (primary) hypertension: Secondary | ICD-10-CM | POA: Insufficient documentation

## 2021-08-09 DIAGNOSIS — R0609 Other forms of dyspnea: Secondary | ICD-10-CM | POA: Insufficient documentation

## 2021-08-09 DIAGNOSIS — R0600 Dyspnea, unspecified: Secondary | ICD-10-CM | POA: Diagnosis not present

## 2021-08-09 HISTORY — PX: OTHER SURGICAL HISTORY: SHX169

## 2021-10-08 ENCOUNTER — Encounter: Payer: Self-pay | Admitting: Cardiology

## 2021-10-08 ENCOUNTER — Ambulatory Visit: Payer: Medicare HMO | Attending: Cardiology | Admitting: Cardiology

## 2021-10-08 DIAGNOSIS — E785 Hyperlipidemia, unspecified: Secondary | ICD-10-CM | POA: Diagnosis not present

## 2021-10-08 DIAGNOSIS — R000 Tachycardia, unspecified: Secondary | ICD-10-CM | POA: Diagnosis not present

## 2021-10-08 DIAGNOSIS — I1A0 Resistant hypertension: Secondary | ICD-10-CM

## 2021-10-08 DIAGNOSIS — R0609 Other forms of dyspnea: Secondary | ICD-10-CM

## 2021-10-08 DIAGNOSIS — I10 Essential (primary) hypertension: Secondary | ICD-10-CM

## 2021-10-08 NOTE — Progress Notes (Signed)
Primary Care Provider: Josetta Huddle, MD Cardiologist: Glenetta Hew, MD Electrophysiologist: None  Clinic Note: Chief Complaint  Patient presents with   Follow-up    Test results.  Overall feeling better.   ===================================  ASSESSMENT/PLAN   Problem List Items Addressed This Visit       Cardiology Problems   Resistant hypertension (Chronic)    Blood pressure looks much better today.  Stable.  Not really having the orthostatic symptoms.  I think with all the medications that she has on board if she is dehydrated she is prone to orthostatic hypotension.  Stressed the importance of adequate hydration. No change in current meds.  Would avoid diuretic.      Relevant Medications   hydrochlorothiazide (HYDRODIURIL) 25 MG tablet   Hyperlipidemia (Chronic)    LDL 117.  Would like to target LDL less than 100.  She would like to try to do lifestyle modification, but if not able to achieve an adequate LDL, would probably consider low-dose rosuvastatin.  Defer to PCP.      Relevant Medications   hydrochlorothiazide (HYDRODIURIL) 25 MG tablet     Other   DOE (dyspnea on exertion) (Chronic)    Normal echocardiogram and CPX indicating dyspnea related to body habitus.  Also hypertensive response to exercise.  This would indicate deconditioning and body habitus is the main etiology.  Stressed the importance of increasing exercise level and try to lose weight.      Sinus tachycardia by electrocardiogram (Chronic)    Heart rate looks pretty well controlled if not bradycardic today.  I think that was likely related to dehydration.  She is on stable dose of Lopressor.  Tolerating well.      ===================================  HPI:    Sheryl Bates is a 66 y.o. female with a PMH notable for HTN (with Hypertensive Urgency) who presents today for to have a 70-month follow-up evaluation for Tachycardia and Orthostatic Hypotension (in Setting of Poor P.O. Intake,  Nausea Vomiting and Diarrhea for Several Days) Dyspnea on Exertion.  Tamia Rana Deblanc was seen for initial evaluation on 07/16/2021 following 2 ER/hospital visits -> initial ER visit 07/07/2021 for nausea vomiting and diarrhea (dehydration) she was referred with complaint of exertional dyspnea tachycardia-in the setting of volume depletion abdominal pain.  Second was an overnight stay June 2-3 continued weakness and malaise that did not improve.  Was treated for UTI with IV antibiotics and fluids.  Has had several falls.  Found to be orthostatic.  Amlodipine and HCTZ were discontinued along with other medications.  Apparently amlodipine ultimately started back for saw her.  => On presentation with this visit, she noted decreased energy level, and notable anxiety.  She felt as though her heart were beating out of control and jittery with difficulty speaking.  Indicated that in the past she was able to mow her lawn but had to stop several times due to dyspnea now.  Also noted dyspnea going up steps.  No chest pain or pressure.  Had some near syncope symptoms. 2D Echo and CPX ordered; did not order a monitor since the patient was no longer noticing significant tachycardia.  Discussed [place] monitor as an option if Sx recur.  Recent Hospitalizations: None since last visit  Reviewed  CV studies:    The following studies were reviewed today: (if available, images/films reviewed: From Epic Chart or Care Everywhere)  CPX 08/09/2021: Normal functional capacity with no evidence of significant cardiopulmonary limitation to exercise. Patient's body habitus/obesity likely  playing major role in exertional dyspnea.There was a marked hypertensive response to exercise  TTE 07/27/2021:  1. Left ventricular ejection fraction, by estimation, is 65 to 70%. The left ventricle has normal function. The left ventricle has no regional wall motion abnormalities. There is moderate concentric left ventricular hypertrophy. Left  ventricular diastolic parameters are indeterminate.   2. Right ventricular systolic function is normal. The right ventricular size is normal. There is normal pulmonary artery systolic pressure. Normal LA & RA w/ normal RAP.  3. The mitral valve is normal in structure. Trivial mitral valve regurgitation. No evidence of mitral stenosis.  4. The aortic valve is tricuspid. Aortic valve regurgitation is not visualized. No aortic stenosis is present. 5. The inferior vena cava is normal in size with greater than 50% respiratory variability, suggesting right atrial pressure of 3 mmHg  Interval History:   [name] returns here today actually doing fairly well.  She still has some exertional dyspnea but has not really had any further of the episodes of tachycardia and denies any significant irregular heartbeats.  No longer having the significant orthostatic dizziness either. Her energy level is gradually building back up again but not back to her baseline.  Thankfully, the abdominal pain and nausea etc. is all seem to improve.  Legs are not giving out on her anymore.  Just still has a little fatigue and lack of energy.  CV Review of Symptoms (Summary): positive for - dyspnea on exertion and mild exercise intolerance, fatigue.  Less frequent tachycardia spells.  Very limited. negative for - chest pain, edema, irregular heartbeat, orthopnea, paroxysmal nocturnal dyspnea, shortness of breath, or syncope/near syncope or TIA/amaurosis fugax, claudication  REVIEWED OF SYSTEMS   Review of Systems  Constitutional:  Positive for malaise/fatigue (just mild exercise intolerance).  Gastrointestinal:  Negative for blood in stool and melena.       Less prominent GI issues.  Genitourinary:  Negative for hematuria.  Neurological:  Positive for dizziness (Still having some dizziness but much better.). Negative for focal weakness and weakness.  Psychiatric/Behavioral:  The patient is nervous/anxious.     I have  reviewed and (if needed) personally updated the patient's problem list, medications, allergies, past medical and surgical history, social and family history.   PAST MEDICAL HISTORY   Past Medical History:  Diagnosis Date   Allergy    CIN I (cervical intraepithelial neoplasia I)    Endometriosis    Hyperlipidemia    Hypertension    Kidney stone    Ovarian cyst    right   Vitamin D deficiency     PAST SURGICAL HISTORY   Past Surgical History:  Procedure Laterality Date   CARDIOPULMONARY EXERCISE TEST (CPX)  08/09/2021   Normal functional capacity without evidence of significant cardiopulmonary limitation due to exercise.  Marked hypertensive response to exercise.  Body habitus/obesity likely playing a role in exertional dyspnea.   CERVICAL BIOPSY  W/ LOOP ELECTRODE EXCISION     COLPOSCOPY     CYSTOSCOPY/RETROGRADE/URETEROSCOPY/STONE EXTRACTION WITH BASKET Left 12/20/2017   Procedure: CYSTOSCOPY/RETROGRADE/URETEROSCOPY/ LEFT URETERAL STENT PLACEMENT;  Surgeon: Malen Gauze, MD;  Location: [facility];  Service: Urology;  Laterality: Left;   ESOPHAGOGASTRODUODENOSCOPY N/A 03/18/2016   Procedure: ESOPHAGOGASTRODUODENOSCOPY (EGD);  Surgeon: Bernette Redbird, MD;  Location: [facility];  Service: Endoscopy;  Laterality: N/A;   IR GENERIC HISTORICAL  03/15/2016   IR EPIDUROGRAPHY 03/15/2016 [facility]   KNEE SURGERY     Right   LAPAROSCOPY  02/11/1994  LEEP  02/11/1990   PELVIC LAPAROSCOPY     TRANSTHORACIC ECHOCARDIOGRAM  07/27/2021   Hyperdynamic EF 65 to 70%.  No RWMA.  Moderate concentric LVH (indeterminate diastolic parameters).  Normal RV size function and normal PASP/RAP.  Normal atria size.  Trivial MR.  Normal AoV    Immunization History  Administered Date(s) Administered   Td 07/06/2009    MEDICATIONS/ALLERGIES   Current Meds  Medication Sig   ALPRAZolam (XANAX) 0.5 MG tablet Take 0.5 mg by mouth daily as needed for anxiety.   amLODipine (NORVASC) 5 MG tablet  Take 5 mg by mouth daily.   aspirin EC 81 MG tablet Take 81 mg by mouth daily.   Biotin 10 MG TABS 1 tablet   citalopram (CELEXA) 20 MG tablet Take 2 tablets (40 mg total) by mouth daily.   hydrALAZINE (APRESOLINE) 25 MG tablet Take 1 tablet (25 mg total) by mouth 3 (three) times daily. (Patient taking differently: Take 25 mg by mouth 2 (two) times daily.)   hydrochlorothiazide (HYDRODIURIL) 25 MG tablet Take 25 mg by mouth every morning.   losartan (COZAAR) 100 MG tablet Take 1 tablet (100 mg total) by mouth daily.   metoprolol tartrate (LOPRESSOR) 50 MG tablet Take 1 tablet (50 mg total) by mouth 2 (two) times daily. (Patient taking differently: Take 50 mg by mouth daily.)   Multiple Vitamins-Minerals (CENTRUM SILVER 50+WOMEN) TABS See admin instructions.   omeprazole (PRILOSEC OTC) 20 MG tablet Take 1 tablet (20 mg total) by mouth daily for 15 days. (Patient taking differently: Take 20 mg by mouth as needed.)   vitamin C (ASCORBIC ACID) 500 MG tablet 1 tablet    Allergies  Allergen Reactions   Sulfonamide Derivatives Diarrhea and Nausea Only   Lisinopril Cough    SOCIAL HISTORY/FAMILY HISTORY   Reviewed in Epic:  Pertinent findings:  Social History   Tobacco Use   Smoking status: Former    Types: Cigarettes    Quit date: 05/03/2005    Years since quitting: 16.4   Smokeless tobacco: Never  Vaping Use   Vaping Use: Never used  Substance Use Topics   Alcohol use: No    Comment: rare   Drug use: No   Social History   Social History Narrative   She is now raising a step-granddaughter (planning to adopt as of 07/19/2013)      Lots of social stress of late.    OBJCTIVE -PE, EKG, labs   Wt Readings from Last 3 Encounters:  10/08/21 187 lb 3.2 oz (84.9 kg)  07/16/21 181 lb 3.2 oz (82.2 kg)  07/14/21 186 lb 1.1 oz (84.4 kg)    Physical Exam: BP 120/74   Pulse (!) 58   Ht 5\' 3"  (1.6 m)   Wt 187 lb 3.2 oz (84.9 kg)   SpO2 99%   BMI 33.16 kg/m  Physical  Exam Constitutional:      General: She is not in acute distress.    Appearance: Normal appearance. She is obese. She is not ill-appearing or toxic-appearing.  HENT:     Head: Normocephalic and atraumatic.  Cardiovascular:     Rate and Rhythm: Normal rate and regular rhythm.     Pulses: Normal pulses.     Heart sounds: Normal heart sounds. No murmur heard.    No friction rub. No gallop.  Musculoskeletal:     Cervical back: Normal range of motion and neck supple.  Neurological:     General: No focal deficit present.  Mental Status: She is alert and oriented to person, place, and time.  Psychiatric:        Mood and Affect: Mood normal.        Behavior: Behavior normal.        Thought Content: Thought content normal.        Judgment: Judgment normal.     Adult ECG Report Not checked  Recent Labs:   08/06/2021: TC 207, TG 154, HDL 63, LDL 117. Lab Results  Component Value Date   CHOL 172 07/19/2013   HDL 57.40 07/19/2013   LDLCALC 98 07/19/2013   TRIG 82.0 07/19/2013   CHOLHDL 3 07/19/2013   Lab Results  Component Value Date   CREATININE 0.76 07/14/2021   BUN 16 07/14/2021   NA 136 07/14/2021   K 3.2 (L) 07/14/2021   CL 104 07/14/2021   CO2 26 07/14/2021      Latest Ref Rng & Units 07/14/2021    5:39 AM 07/13/2021   12:39 PM 07/13/2021    9:40 AM  CBC  WBC 4.0 - 10.5 K/uL 6.7   9.8   Hemoglobin 12.0 - 15.0 g/dL 12.8  15.3  15.7   Hematocrit 36.0 - 46.0 % 38.0  45.0  45.4   Platelets 150 - 400 K/uL 203   314     No results found for: "HGBA1C" Lab Results  Component Value Date   TSH 0.520 07/07/2021    ================================================== I spent a total of 20 minutes with the patient spent in direct patient consultation.  Additional time spent with chart review  / charting (studies, outside notes, etc): 12 min Total Time: 32 min  Current medicines are reviewed at length with the patient today.  (+/- concerns) none  Notice: This dictation was  prepared with Dragon dictation along with smart phrase technology. Any transcriptional errors that result from this process are unintentional and may not be corrected upon review.  Studies Ordered:   No orders of the defined types were placed in this encounter.  No orders of the defined types were placed in this encounter.   Patient Instructions / Medication Changes & Studies & Tests Ordered   Patient Instructions  Medication Instructions:   No changes   *If you need a refill on your cardiac medications before your next appointment, please call your pharmacy*   Lab Work:  Not needed  If you have labs (blood work) drawn today and your tests are completely normal, you will receive your results only by: MyChart Message (if you have MyChart) OR A paper copy in the mail If you have any lab test that is abnormal or we need to change your treatment, we will call you to review the results.   Testing/Procedures:  Not needed  Follow-Up: At [facility], you and your health needs are our priority.  As part of our continuing mission to provide you with exceptional heart care, we have created designated Provider Care Teams.  These Care Teams include your primary Cardiologist (physician) and Advanced Practice Providers (APPs -  Physician Assistants and Nurse Practitioners) who all work together to provide you with the care you need, when you need it.     Your next appointment:   12 month(s)  The format for your next appointment:   In Person  Provider:   Glenetta Hew, MD     Leonie Man, MD, MS Glenetta Hew, M.D., M.S. Interventional Cardiologist  [place]  Pager # [phone] Phone # [phone]  [address] [place], [place] [address]   Thank you for choosing [facility]!!

## 2021-10-08 NOTE — Patient Instructions (Signed)
Medication Instructions:  No changes  *If you need a refill on your cardiac medications before your next appointment, please call your pharmacy*   Lab Work: Not needed If you have labs (blood work) drawn today and your tests are completely normal, you will receive your results only by: . MyChart Message (if you have MyChart) OR . A paper copy in the mail If you have any lab test that is abnormal or we need to change your treatment, we will call you to review the results.   Testing/Procedures:  Not needed  Follow-Up: At [facility], you and your health needs are our priority.  As part of our continuing mission to provide you with exceptional heart care, we have created designated Provider Care Teams.  These Care Teams include your primary Cardiologist (physician) and Advanced Practice Providers (APPs -  Physician Assistants and Nurse Practitioners) who all work together to provide you with the care you need, when you need it.   Your next appointment:   12 month(s)  The format for your next appointment:   In Person  Provider:   David Harding, MD  

## 2021-10-21 ENCOUNTER — Encounter: Payer: Self-pay | Admitting: Cardiology

## 2021-10-21 NOTE — Assessment & Plan Note (Signed)
LDL 117.  Would like to target LDL less than 100.  She would like to try to do lifestyle modification, but if not able to achieve an adequate LDL, would probably consider low-dose rosuvastatin.  Defer to PCP.

## 2021-10-21 NOTE — Assessment & Plan Note (Signed)
Normal echocardiogram and CPX indicating dyspnea related to body habitus.  Also hypertensive response to exercise.  This would indicate deconditioning and body habitus is the main etiology.  Stressed the importance of increasing exercise level and try to lose weight.

## 2021-10-21 NOTE — Assessment & Plan Note (Signed)
Blood pressure looks much better today.  Stable.  Not really having the orthostatic symptoms.  I think with all the medications that she has on board if she is dehydrated she is prone to orthostatic hypotension.  Stressed the importance of adequate hydration. No change in current meds.  Would avoid diuretic.

## 2021-10-21 NOTE — Assessment & Plan Note (Signed)
Heart rate looks pretty well controlled if not bradycardic today.  I think that was likely related to dehydration.  She is on stable dose of Lopressor.  Tolerating well.

## 2021-11-07 DIAGNOSIS — H353111 Nonexudative age-related macular degeneration, right eye, early dry stage: Secondary | ICD-10-CM | POA: Diagnosis not present

## 2021-11-13 ENCOUNTER — Ambulatory Visit: Payer: Medicare HMO | Admitting: Cardiology

## 2022-01-09 DIAGNOSIS — K449 Diaphragmatic hernia without obstruction or gangrene: Secondary | ICD-10-CM | POA: Diagnosis not present

## 2022-01-09 DIAGNOSIS — E669 Obesity, unspecified: Secondary | ICD-10-CM | POA: Diagnosis not present

## 2022-01-09 DIAGNOSIS — K5730 Diverticulosis of large intestine without perforation or abscess without bleeding: Secondary | ICD-10-CM | POA: Diagnosis not present

## 2022-01-09 DIAGNOSIS — I10 Essential (primary) hypertension: Secondary | ICD-10-CM | POA: Diagnosis not present

## 2022-01-09 DIAGNOSIS — E559 Vitamin D deficiency, unspecified: Secondary | ICD-10-CM | POA: Diagnosis not present

## 2022-01-09 DIAGNOSIS — I700 Atherosclerosis of aorta: Secondary | ICD-10-CM | POA: Diagnosis not present

## 2022-01-09 DIAGNOSIS — R69 Illness, unspecified: Secondary | ICD-10-CM | POA: Diagnosis not present

## 2022-01-09 DIAGNOSIS — K222 Esophageal obstruction: Secondary | ICD-10-CM | POA: Diagnosis not present

## 2022-01-09 DIAGNOSIS — E785 Hyperlipidemia, unspecified: Secondary | ICD-10-CM | POA: Diagnosis not present

## 2022-01-11 DIAGNOSIS — K625 Hemorrhage of anus and rectum: Secondary | ICD-10-CM | POA: Diagnosis not present

## 2022-02-06 DIAGNOSIS — D125 Benign neoplasm of sigmoid colon: Secondary | ICD-10-CM | POA: Diagnosis not present

## 2022-02-06 DIAGNOSIS — D124 Benign neoplasm of descending colon: Secondary | ICD-10-CM | POA: Diagnosis not present

## 2022-02-06 DIAGNOSIS — K625 Hemorrhage of anus and rectum: Secondary | ICD-10-CM | POA: Diagnosis not present

## 2022-02-06 DIAGNOSIS — K5730 Diverticulosis of large intestine without perforation or abscess without bleeding: Secondary | ICD-10-CM | POA: Diagnosis not present

## 2022-02-06 DIAGNOSIS — K648 Other hemorrhoids: Secondary | ICD-10-CM | POA: Diagnosis not present

## 2022-02-06 DIAGNOSIS — D122 Benign neoplasm of ascending colon: Secondary | ICD-10-CM | POA: Diagnosis not present

## 2022-02-12 DIAGNOSIS — D125 Benign neoplasm of sigmoid colon: Secondary | ICD-10-CM | POA: Diagnosis not present

## 2022-02-12 DIAGNOSIS — D124 Benign neoplasm of descending colon: Secondary | ICD-10-CM | POA: Diagnosis not present

## 2022-02-12 DIAGNOSIS — D122 Benign neoplasm of ascending colon: Secondary | ICD-10-CM | POA: Diagnosis not present

## 2022-08-14 DIAGNOSIS — Z7189 Other specified counseling: Secondary | ICD-10-CM | POA: Diagnosis not present

## 2022-08-14 DIAGNOSIS — L821 Other seborrheic keratosis: Secondary | ICD-10-CM | POA: Diagnosis not present

## 2022-08-14 DIAGNOSIS — D692 Other nonthrombocytopenic purpura: Secondary | ICD-10-CM | POA: Diagnosis not present

## 2022-08-14 DIAGNOSIS — L814 Other melanin hyperpigmentation: Secondary | ICD-10-CM | POA: Diagnosis not present

## 2022-08-14 DIAGNOSIS — D225 Melanocytic nevi of trunk: Secondary | ICD-10-CM | POA: Diagnosis not present

## 2022-09-10 DIAGNOSIS — I10 Essential (primary) hypertension: Secondary | ICD-10-CM | POA: Diagnosis not present

## 2022-09-10 DIAGNOSIS — Z0000 Encounter for general adult medical examination without abnormal findings: Secondary | ICD-10-CM | POA: Diagnosis not present

## 2022-09-10 DIAGNOSIS — K5730 Diverticulosis of large intestine without perforation or abscess without bleeding: Secondary | ICD-10-CM | POA: Diagnosis not present

## 2022-09-10 DIAGNOSIS — E669 Obesity, unspecified: Secondary | ICD-10-CM | POA: Diagnosis not present

## 2022-09-10 DIAGNOSIS — F329 Major depressive disorder, single episode, unspecified: Secondary | ICD-10-CM | POA: Diagnosis not present

## 2022-09-10 DIAGNOSIS — F439 Reaction to severe stress, unspecified: Secondary | ICD-10-CM | POA: Diagnosis not present

## 2022-09-10 DIAGNOSIS — I700 Atherosclerosis of aorta: Secondary | ICD-10-CM | POA: Diagnosis not present

## 2022-09-10 DIAGNOSIS — G8929 Other chronic pain: Secondary | ICD-10-CM | POA: Diagnosis not present

## 2022-09-10 DIAGNOSIS — E785 Hyperlipidemia, unspecified: Secondary | ICD-10-CM | POA: Diagnosis not present

## 2022-09-10 DIAGNOSIS — K449 Diaphragmatic hernia without obstruction or gangrene: Secondary | ICD-10-CM | POA: Diagnosis not present

## 2022-09-10 DIAGNOSIS — E559 Vitamin D deficiency, unspecified: Secondary | ICD-10-CM | POA: Diagnosis not present

## 2022-09-10 DIAGNOSIS — K222 Esophageal obstruction: Secondary | ICD-10-CM | POA: Diagnosis not present

## 2022-09-11 ENCOUNTER — Other Ambulatory Visit: Payer: Self-pay | Admitting: Internal Medicine

## 2022-09-11 ENCOUNTER — Ambulatory Visit
Admission: RE | Admit: 2022-09-11 | Discharge: 2022-09-11 | Disposition: A | Discharge status: Home / Self Care | Payer: Medicare HMO | Source: Ambulatory Visit | Attending: Internal Medicine | Admitting: Internal Medicine

## 2022-09-11 DIAGNOSIS — M47816 Spondylosis without myelopathy or radiculopathy, lumbar region: Secondary | ICD-10-CM | POA: Diagnosis not present

## 2022-09-11 DIAGNOSIS — M545 Low back pain, unspecified: Secondary | ICD-10-CM

## 2022-09-17 DIAGNOSIS — M791 Myalgia, unspecified site: Secondary | ICD-10-CM | POA: Diagnosis not present

## 2022-09-17 DIAGNOSIS — M1711 Unilateral primary osteoarthritis, right knee: Secondary | ICD-10-CM | POA: Diagnosis not present

## 2022-09-17 DIAGNOSIS — M170 Bilateral primary osteoarthritis of knee: Secondary | ICD-10-CM | POA: Diagnosis not present

## 2022-09-17 DIAGNOSIS — M1712 Unilateral primary osteoarthritis, left knee: Secondary | ICD-10-CM | POA: Diagnosis not present

## 2022-10-07 DIAGNOSIS — M25661 Stiffness of right knee, not elsewhere classified: Secondary | ICD-10-CM | POA: Diagnosis not present

## 2022-10-07 DIAGNOSIS — M1731 Unilateral post-traumatic osteoarthritis, right knee: Secondary | ICD-10-CM | POA: Diagnosis not present

## 2022-10-07 DIAGNOSIS — R262 Difficulty in walking, not elsewhere classified: Secondary | ICD-10-CM | POA: Diagnosis not present

## 2022-10-10 DIAGNOSIS — M25661 Stiffness of right knee, not elsewhere classified: Secondary | ICD-10-CM | POA: Diagnosis not present

## 2022-10-10 DIAGNOSIS — M1731 Unilateral post-traumatic osteoarthritis, right knee: Secondary | ICD-10-CM | POA: Diagnosis not present

## 2022-10-10 DIAGNOSIS — R262 Difficulty in walking, not elsewhere classified: Secondary | ICD-10-CM | POA: Diagnosis not present

## 2022-10-11 ENCOUNTER — Other Ambulatory Visit: Payer: Self-pay | Admitting: Nurse Practitioner

## 2022-10-11 ENCOUNTER — Encounter: Payer: Self-pay | Admitting: Nurse Practitioner

## 2022-10-11 DIAGNOSIS — Z1231 Encounter for screening mammogram for malignant neoplasm of breast: Secondary | ICD-10-CM

## 2022-10-11 DIAGNOSIS — M85832 Other specified disorders of bone density and structure, left forearm: Secondary | ICD-10-CM

## 2022-10-11 DIAGNOSIS — Z01419 Encounter for gynecological examination (general) (routine) without abnormal findings: Secondary | ICD-10-CM | POA: Diagnosis not present

## 2022-10-15 DIAGNOSIS — M1711 Unilateral primary osteoarthritis, right knee: Secondary | ICD-10-CM | POA: Diagnosis not present

## 2022-10-16 DIAGNOSIS — M1731 Unilateral post-traumatic osteoarthritis, right knee: Secondary | ICD-10-CM | POA: Diagnosis not present

## 2022-10-16 DIAGNOSIS — M25661 Stiffness of right knee, not elsewhere classified: Secondary | ICD-10-CM | POA: Diagnosis not present

## 2022-10-16 DIAGNOSIS — R262 Difficulty in walking, not elsewhere classified: Secondary | ICD-10-CM | POA: Diagnosis not present

## 2022-10-23 DIAGNOSIS — G8918 Other acute postprocedural pain: Secondary | ICD-10-CM | POA: Diagnosis not present

## 2022-10-23 DIAGNOSIS — M1711 Unilateral primary osteoarthritis, right knee: Secondary | ICD-10-CM | POA: Diagnosis not present

## 2022-10-23 DIAGNOSIS — Z96651 Presence of right artificial knee joint: Secondary | ICD-10-CM | POA: Diagnosis not present

## 2022-10-24 ENCOUNTER — Ambulatory Visit: Payer: Medicare HMO

## 2022-10-25 DIAGNOSIS — M25661 Stiffness of right knee, not elsewhere classified: Secondary | ICD-10-CM | POA: Diagnosis not present

## 2022-10-25 DIAGNOSIS — Z96651 Presence of right artificial knee joint: Secondary | ICD-10-CM | POA: Diagnosis not present

## 2022-10-28 ENCOUNTER — Emergency Department (HOSPITAL_BASED_OUTPATIENT_CLINIC_OR_DEPARTMENT_OTHER): Payer: Medicare HMO

## 2022-10-28 ENCOUNTER — Emergency Department (HOSPITAL_BASED_OUTPATIENT_CLINIC_OR_DEPARTMENT_OTHER)
Admission: EM | Admit: 2022-10-28 | Discharge: 2022-10-28 | Disposition: A | Discharge status: Home / Self Care | Payer: Medicare HMO | Attending: Emergency Medicine | Admitting: Emergency Medicine

## 2022-10-28 ENCOUNTER — Other Ambulatory Visit: Payer: Self-pay

## 2022-10-28 ENCOUNTER — Encounter (HOSPITAL_BASED_OUTPATIENT_CLINIC_OR_DEPARTMENT_OTHER): Payer: Self-pay

## 2022-10-28 ENCOUNTER — Other Ambulatory Visit (HOSPITAL_BASED_OUTPATIENT_CLINIC_OR_DEPARTMENT_OTHER): Payer: Self-pay

## 2022-10-28 DIAGNOSIS — T50905A Adverse effect of unspecified drugs, medicaments and biological substances, initial encounter: Secondary | ICD-10-CM | POA: Insufficient documentation

## 2022-10-28 DIAGNOSIS — I10 Essential (primary) hypertension: Secondary | ICD-10-CM | POA: Insufficient documentation

## 2022-10-28 DIAGNOSIS — Z79899 Other long term (current) drug therapy: Secondary | ICD-10-CM | POA: Insufficient documentation

## 2022-10-28 DIAGNOSIS — Z7982 Long term (current) use of aspirin: Secondary | ICD-10-CM | POA: Insufficient documentation

## 2022-10-28 DIAGNOSIS — M25661 Stiffness of right knee, not elsewhere classified: Secondary | ICD-10-CM | POA: Diagnosis not present

## 2022-10-28 DIAGNOSIS — Z87442 Personal history of urinary calculi: Secondary | ICD-10-CM | POA: Insufficient documentation

## 2022-10-28 DIAGNOSIS — R441 Visual hallucinations: Secondary | ICD-10-CM | POA: Diagnosis not present

## 2022-10-28 DIAGNOSIS — Z96651 Presence of right artificial knee joint: Secondary | ICD-10-CM | POA: Diagnosis not present

## 2022-10-28 DIAGNOSIS — R4182 Altered mental status, unspecified: Secondary | ICD-10-CM | POA: Diagnosis not present

## 2022-10-28 DIAGNOSIS — R410 Disorientation, unspecified: Secondary | ICD-10-CM | POA: Diagnosis not present

## 2022-10-28 DIAGNOSIS — T887XXA Unspecified adverse effect of drug or medicament, initial encounter: Secondary | ICD-10-CM | POA: Diagnosis not present

## 2022-10-28 LAB — COMPREHENSIVE METABOLIC PANEL
ALT: 29 U/L (ref 0–44)
AST: 25 U/L (ref 15–41)
Albumin: 3.4 g/dL — ABNORMAL LOW (ref 3.5–5.0)
Alkaline Phosphatase: 59 U/L (ref 38–126)
Anion gap: 8 (ref 5–15)
BUN: 17 mg/dL (ref 8–23)
CO2: 29 mmol/L (ref 22–32)
Calcium: 8.7 mg/dL — ABNORMAL LOW (ref 8.9–10.3)
Chloride: 101 mmol/L (ref 98–111)
Creatinine, Ser: 0.9 mg/dL (ref 0.44–1.00)
GFR, Estimated: >60 mL/min (ref >60)
Glucose, Bld: 125 mg/dL — ABNORMAL HIGH (ref 70–99)
Potassium: 3.4 mmol/L — ABNORMAL LOW (ref 3.5–5.1)
Sodium: 138 mmol/L (ref 135–145)
Total Bilirubin: 1.1 mg/dL (ref 0.3–1.2)
Total Protein: 5.6 g/dL — ABNORMAL LOW (ref 6.5–8.1)

## 2022-10-28 LAB — URINALYSIS, W/ REFLEX TO CULTURE (INFECTION SUSPECTED)
Bacteria, UA: NONE SEEN
Bilirubin Urine: NEGATIVE
Glucose, UA: NEGATIVE mg/dL
Hgb urine dipstick: NEGATIVE
Ketones, ur: NEGATIVE mg/dL
Nitrite: NEGATIVE
Protein, ur: NEGATIVE mg/dL
Specific Gravity, Urine: 1.009 (ref 1.005–1.030)
pH: 6 (ref 5.0–8.0)

## 2022-10-28 LAB — CBC WITH DIFFERENTIAL/PLATELET
Abs Immature Granulocytes: 0.03 10*3/uL (ref 0.00–0.07)
Basophils Absolute: 0 10*3/uL (ref 0.0–0.1)
Basophils Relative: 0 %
Eosinophils Absolute: 0.1 10*3/uL (ref 0.0–0.5)
Eosinophils Relative: 1 %
HCT: 29.9 % — ABNORMAL LOW (ref 36.0–46.0)
Hemoglobin: 9.8 g/dL — ABNORMAL LOW (ref 12.0–15.0)
Immature Granulocytes: 1 %
Lymphocytes Relative: 15 %
Lymphs Abs: 1 10*3/uL (ref 0.7–4.0)
MCH: 31.8 pg (ref 26.0–34.0)
MCHC: 32.8 g/dL (ref 30.0–36.0)
MCV: 97.1 fL (ref 80.0–100.0)
Monocytes Absolute: 0.7 10*3/uL (ref 0.1–1.0)
Monocytes Relative: 11 %
Neutro Abs: 4.7 10*3/uL (ref 1.7–7.7)
Neutrophils Relative %: 72 %
Platelets: 184 10*3/uL (ref 150–400)
RBC: 3.08 MIL/uL — ABNORMAL LOW (ref 3.87–5.11)
RDW: 12.6 % (ref 11.5–15.5)
WBC: 6.4 10*3/uL (ref 4.0–10.5)
nRBC: 0 % (ref 0.0–0.2)

## 2022-10-28 MED ORDER — HYDROMORPHONE HCL 1 MG/ML IJ SOLN
0.5000 mg | Freq: Once | INTRAMUSCULAR | Status: AC
  Administered 2022-10-28: 0.5 mg via INTRAVENOUS
  Filled 2022-10-28: qty 1

## 2022-10-28 NOTE — Discharge Instructions (Signed)
Try and decrease the pain medicine.  Also watch taking it with the muscle relaxer.  Urine culture has been sent and you will be notified if it does show an infection.

## 2022-10-28 NOTE — ED Triage Notes (Signed)
Pt c/o hallucinations, dizziness, "can't get my words right in sentences for a couple of days- I just feel out of it." Advises knee replacement last Wednesday  Physical therapy at 10a, advised to come in for eval

## 2022-10-28 NOTE — ED Notes (Signed)
Patient's husband up to desk about patient being in pain from her knee surgery. I notified Dr. Rubin Payor. No orders at this time. He is coming to speak with the patient momentarily. I notified the patient and her husband.

## 2022-10-28 NOTE — ED Provider Notes (Signed)
[place] EMERGENCY DEPARTMENT AT [facility] Provider Note   CSN: [id number] Arrival date & time: 10/28/22  1203     History  Chief Complaint  Patient presents with   Hallucinations   Altered Mental Status    Sheryl Bates is a 67 y.o. female.   Altered Mental Status Patient with recent right knee surgery.  Has been on pain meds.  Since being on the pain medicines has had mental status changes.  Confusion.  Will see hallucinations of spider webs moving around and saw a horse  on a football field.  Continues to have pain.  Family has decreased her pain medicine and headache decrease in the hallucinations.  However continuing to have pain.  No dysuria.    Past Medical History:  Diagnosis Date   Allergy    CIN I (cervical intraepithelial neoplasia I)    Endometriosis    Hyperlipidemia    Hypertension    Kidney stone    Ovarian cyst    right   Vitamin D deficiency     Home Medications Prior to Admission medications   Medication Sig Start Date End Date Taking? Authorizing Provider  ALPRAZolam Prudy Feeler) 0.5 MG tablet Take 0.5 mg by mouth daily as needed for anxiety. 01/19/21   [provider]  amLODipine (NORVASC) 5 MG tablet Take 5 mg by mouth daily.    [provider]  aspirin EC 81 MG tablet Take 81 mg by mouth daily.    [provider]  Biotin 10 MG TABS 1 tablet    [provider]  citalopram (CELEXA) 20 MG tablet Take 2 tablets (40 mg total) by mouth daily. 07/14/21   Hongalgi, Maximino Greenland, MD  hydrALAZINE (APRESOLINE) 25 MG tablet Take 1 tablet (25 mg total) by mouth 3 (three) times daily. Patient taking differently: Take 25 mg by mouth 2 (two) times daily. 12/26/17   McKenzie, Mardene Celeste, MD  hydrochlorothiazide (HYDRODIURIL) 25 MG tablet Take 25 mg by mouth every morning. 08/24/21   [provider]  losartan (COZAAR) 100 MG tablet Take 1 tablet (100 mg total) by mouth daily. 12/27/17   McKenzie, Mardene Celeste, MD   metoprolol tartrate (LOPRESSOR) 50 MG tablet Take 1 tablet (50 mg total) by mouth 2 (two) times daily. Patient taking differently: Take 50 mg by mouth daily. 12/26/17   McKenzie, Mardene Celeste, MD  Multiple Vitamins-Minerals (CENTRUM SILVER 50+WOMEN) TABS See admin instructions.    [provider]  omeprazole (PRILOSEC OTC) 20 MG tablet Take 1 tablet (20 mg total) by mouth daily for 15 days. Patient taking differently: Take 20 mg by mouth as needed. 07/14/21 10/08/21  Elease Etienne, MD  vitamin C (ASCORBIC ACID) 500 MG tablet 1 tablet    [provider]      Allergies    Sulfonamide derivatives and Lisinopril    Review of Systems   Review of Systems  Physical Exam Updated Vital Signs BP (!) 145/58   Pulse 66   Temp 98.4 F (36.9 C)   Resp 17   SpO2 99%  Physical Exam Vitals and nursing note reviewed.  Cardiovascular:     Rate and Rhythm: Normal rate.  Abdominal:     Tenderness: There is no abdominal tenderness.  Musculoskeletal:     Comments: Right knee postsurgical with dressing in place.  Skin:    Capillary Refill: Capillary refill takes less than 2 seconds.  Neurological:     Mental Status: She is alert and oriented to  person, place, and time.     ED Results / Procedures / Treatments   Labs (all labs ordered are listed, but only abnormal results are displayed) Labs Reviewed  COMPREHENSIVE METABOLIC PANEL - Abnormal; Notable for the following components:      Result Value   Potassium 3.4 (*)    Glucose, Bld 125 (*)    Calcium 8.7 (*)    Total Protein 5.6 (*)    Albumin 3.4 (*)    All other components within normal limits  CBC WITH DIFFERENTIAL/PLATELET - Abnormal; Notable for the following components:   RBC 3.08 (*)    Hemoglobin 9.8 (*)    HCT 29.9 (*)    All other components within normal limits  URINALYSIS, W/ REFLEX TO CULTURE (INFECTION SUSPECTED) - Abnormal; Notable for the following components:   Leukocytes,Ua LARGE (*)    All other  components within normal limits  URINE CULTURE    EKG None  Radiology CT Head Wo Contrast  Result Date: 10/28/2022 CLINICAL DATA:  Mental status changes.  Recent knee replacement. EXAM: CT HEAD WITHOUT CONTRAST TECHNIQUE: Contiguous axial images were obtained from the base of the skull through the vertex without intravenous contrast. RADIATION DOSE REDUCTION: This exam was performed according to the departmental dose-optimization program which includes automated exposure control, adjustment of the mA and/or kV according to patient size and/or use of iterative reconstruction technique. COMPARISON:  07/13/2021 FINDINGS: Brain: No mass lesion, hemorrhage, hydrocephalus, acute infarct, intra-axial, or extra-axial fluid collection. Vascular: No hyperdense vessel or unexpected calcification. Skull: Normal Sinuses/Orbits: Normal imaged portions of the orbits and globes. Clear paranasal sinuses and mastoid air cells. Other: None. IMPRESSION: No acute intracranial abnormality. Electronically Signed   By: Jeronimo Greaves M.D.   On: 10/28/2022 14:38    Procedures Procedures    Medications Ordered in ED Medications  HYDROmorphone (DILAUDID) injection 0.5 mg (0.5 mg Intravenous Given 10/28/22 1512)    ED Course/ Medical Decision Making/ A&P                                 Medical Decision Making Amount and/or Complexity of Data Reviewed Labs: ordered. Radiology: ordered.  Risk Prescription drug management.   Patient with mental status change.  Differential diagnoses long but includes infection, intracranial hemorrhage.  Blood work is reassuring. Head CT reassuring.  Urine showed potential infection but not having dysuria.  However has had previous pyelonephritis.  With mental status change her UTI could be a cause.  Culture sent.  If patient develop fevers would definitely treat.  If culture comes back positive would recheck patient and if not improved off changing her medicines would likely  need antibiotics.         Final Clinical Impression(s) / ED Diagnoses Final diagnoses:  Medication reaction, initial encounter    Rx / DC Orders ED Discharge Orders     None         Benjiman Core, MD 10/28/22 1540

## 2022-10-29 DIAGNOSIS — T887XXA Unspecified adverse effect of drug or medicament, initial encounter: Secondary | ICD-10-CM | POA: Diagnosis not present

## 2022-10-29 LAB — URINE CULTURE: Culture: <10000 — AB

## 2022-10-31 ENCOUNTER — Emergency Department (HOSPITAL_BASED_OUTPATIENT_CLINIC_OR_DEPARTMENT_OTHER)
Admission: EM | Admit: 2022-10-31 | Discharge: 2022-10-31 | Disposition: A | Discharge status: Home / Self Care | Payer: Medicare HMO | Attending: Emergency Medicine | Admitting: Emergency Medicine

## 2022-10-31 ENCOUNTER — Emergency Department (HOSPITAL_BASED_OUTPATIENT_CLINIC_OR_DEPARTMENT_OTHER): Payer: Medicare HMO

## 2022-10-31 ENCOUNTER — Other Ambulatory Visit: Payer: Self-pay

## 2022-10-31 ENCOUNTER — Encounter (HOSPITAL_BASED_OUTPATIENT_CLINIC_OR_DEPARTMENT_OTHER): Payer: Self-pay | Admitting: Emergency Medicine

## 2022-10-31 DIAGNOSIS — K5900 Constipation, unspecified: Secondary | ICD-10-CM | POA: Diagnosis not present

## 2022-10-31 DIAGNOSIS — K5909 Other constipation: Secondary | ICD-10-CM | POA: Insufficient documentation

## 2022-10-31 DIAGNOSIS — R112 Nausea with vomiting, unspecified: Secondary | ICD-10-CM

## 2022-10-31 DIAGNOSIS — K644 Residual hemorrhoidal skin tags: Secondary | ICD-10-CM | POA: Diagnosis not present

## 2022-10-31 DIAGNOSIS — I10 Essential (primary) hypertension: Secondary | ICD-10-CM | POA: Diagnosis not present

## 2022-10-31 DIAGNOSIS — R030 Elevated blood-pressure reading, without diagnosis of hypertension: Secondary | ICD-10-CM

## 2022-10-31 DIAGNOSIS — N200 Calculus of kidney: Secondary | ICD-10-CM | POA: Insufficient documentation

## 2022-10-31 DIAGNOSIS — K5730 Diverticulosis of large intestine without perforation or abscess without bleeding: Secondary | ICD-10-CM | POA: Diagnosis not present

## 2022-10-31 DIAGNOSIS — Z96651 Presence of right artificial knee joint: Secondary | ICD-10-CM | POA: Insufficient documentation

## 2022-10-31 DIAGNOSIS — K429 Umbilical hernia without obstruction or gangrene: Secondary | ICD-10-CM | POA: Diagnosis not present

## 2022-10-31 DIAGNOSIS — R1084 Generalized abdominal pain: Secondary | ICD-10-CM | POA: Diagnosis not present

## 2022-10-31 DIAGNOSIS — Z7982 Long term (current) use of aspirin: Secondary | ICD-10-CM | POA: Insufficient documentation

## 2022-10-31 LAB — COMPREHENSIVE METABOLIC PANEL
ALT: 25 U/L (ref 0–44)
AST: 26 U/L (ref 15–41)
Albumin: 3.9 g/dL (ref 3.5–5.0)
Alkaline Phosphatase: 64 U/L (ref 38–126)
Anion gap: 11 (ref 5–15)
BUN: 14 mg/dL (ref 8–23)
CO2: 27 mmol/L (ref 22–32)
Calcium: 9 mg/dL (ref 8.9–10.3)
Chloride: 99 mmol/L (ref 98–111)
Creatinine, Ser: 0.63 mg/dL (ref 0.44–1.00)
GFR, Estimated: >60 mL/min (ref >60)
Glucose, Bld: 122 mg/dL — ABNORMAL HIGH (ref 70–99)
Potassium: 3.3 mmol/L — ABNORMAL LOW (ref 3.5–5.1)
Sodium: 137 mmol/L (ref 135–145)
Total Bilirubin: 1.5 mg/dL — ABNORMAL HIGH (ref 0.3–1.2)
Total Protein: 6.3 g/dL — ABNORMAL LOW (ref 6.5–8.1)

## 2022-10-31 LAB — CBC
HCT: 31.1 % — ABNORMAL LOW (ref 36.0–46.0)
Hemoglobin: 10.3 g/dL — ABNORMAL LOW (ref 12.0–15.0)
MCH: 31.5 pg (ref 26.0–34.0)
MCHC: 33.1 g/dL (ref 30.0–36.0)
MCV: 95.1 fL (ref 80.0–100.0)
Platelets: 229 10*3/uL (ref 150–400)
RBC: 3.27 MIL/uL — ABNORMAL LOW (ref 3.87–5.11)
RDW: 13 % (ref 11.5–15.5)
WBC: 9.4 10*3/uL (ref 4.0–10.5)
nRBC: 0 % (ref 0.0–0.2)

## 2022-10-31 LAB — URINALYSIS, ROUTINE W REFLEX MICROSCOPIC
Bacteria, UA: NONE SEEN
Bilirubin Urine: NEGATIVE
Glucose, UA: NEGATIVE mg/dL
Hgb urine dipstick: NEGATIVE
Ketones, ur: NEGATIVE mg/dL
Nitrite: NEGATIVE
Protein, ur: NEGATIVE mg/dL
Specific Gravity, Urine: 1.01 (ref 1.005–1.030)
pH: 8 (ref 5.0–8.0)

## 2022-10-31 MED ORDER — LACTATED RINGERS IV BOLUS
1000.0000 mL | Freq: Once | INTRAVENOUS | Status: AC
  Administered 2022-10-31: 1000 mL via INTRAVENOUS

## 2022-10-31 MED ORDER — ONDANSETRON HCL 4 MG/2ML IJ SOLN
4.0000 mg | Freq: Once | INTRAMUSCULAR | Status: AC
  Administered 2022-10-31: 4 mg via INTRAVENOUS
  Filled 2022-10-31: qty 2

## 2022-10-31 MED ORDER — BISACODYL 10 MG RE SUPP
10.0000 mg | Freq: Once | RECTAL | Status: AC
  Administered 2022-10-31: 10 mg via RECTAL
  Filled 2022-10-31: qty 1

## 2022-10-31 MED ORDER — IOHEXOL 300 MG/ML  SOLN
100.0000 mL | Freq: Once | INTRAMUSCULAR | Status: AC | PRN
  Administered 2022-10-31: 100 mL via INTRAVENOUS

## 2022-10-31 MED ORDER — POTASSIUM CHLORIDE CRYS ER 20 MEQ PO TBCR
40.0000 meq | EXTENDED_RELEASE_TABLET | Freq: Once | ORAL | Status: AC
  Administered 2022-10-31: 40 meq via ORAL
  Filled 2022-10-31: qty 2

## 2022-10-31 MED ORDER — MORPHINE SULFATE (PF) 4 MG/ML IV SOLN
4.0000 mg | Freq: Once | INTRAVENOUS | Status: AC
  Administered 2022-10-31: 4 mg via INTRAVENOUS
  Filled 2022-10-31: qty 1

## 2022-10-31 NOTE — ED Notes (Signed)
Patient transported to CT 

## 2022-10-31 NOTE — Discharge Instructions (Addendum)
It was our pleasure to provide your ER care today - we hope that you feel better.  For constipation, make sure to drink plenty of fluids/get adequate fiber in diet. Take colace (stool softener) 2x/day, and take miralax (laxative) once per day as need.  These medications are available over the counter.   Take acetaminophen as need for pain. You may take zofran as need for nausea.  Follow up with your orthopedist as arranged with them.   For constipation, nausea, abdominal pain - follow up closely with primary care doctor in the next few days if symptoms fail to improve/resolve.   Return to ER if worse, new symptoms, fevers, new or worsening or severe abdominal pain, persistent vomiting, or other concern.

## 2022-10-31 NOTE — ED Notes (Signed)
ED Provider at bedside. 

## 2022-10-31 NOTE — ED Notes (Signed)
Pt given ice water to drink

## 2022-10-31 NOTE — ED Triage Notes (Signed)
Pt arrives pov, bib wheelchair, c/o generalized ABD n/v, constipation with decreased po intake x 2 days

## 2022-10-31 NOTE — ED Provider Notes (Signed)
[place] EMERGENCY DEPARTMENT AT [facility] Provider Note   CSN: [id number] Arrival date & time: 10/31/22  7829     History  Chief Complaint  Patient presents with   Abdominal Pain    Sheryl Bates is a 67 y.o. female.  Pt c/o nausea, constipation, poor appetite, since right TKA this past week. Indicates was taking pain meds post op, and had noted some constipation. No bloody or bilious emesis. No focal or constant abd pain, intermittent mild, general cramping. No fever or chills. No dysuria or gu c/o. No increased pain, swelling or redness to knee area. Pt indicates has been passing gas regularly, including a large amount this AM, along with some loose stool.   The history is provided by the patient, the spouse and medical records.  Abdominal Pain Associated symptoms: nausea   Associated symptoms: no chest pain, no chills, no cough, no dysuria, no fever, no shortness of breath and no sore throat        Home Medications Prior to Admission medications   Medication Sig Start Date End Date Taking? Authorizing Provider  ALPRAZolam Prudy Feeler) 0.5 MG tablet Take 0.5 mg by mouth daily as needed for anxiety. 01/19/21  Yes [provider]  amLODipine (NORVASC) 5 MG tablet Take 5 mg by mouth daily.   Yes [provider]  Biotin 10 MG TABS 1 tablet   Yes [provider]  hydrALAZINE (APRESOLINE) 25 MG tablet Take 1 tablet (25 mg total) by mouth 3 (three) times daily. Patient taking differently: Take 25 mg by mouth 2 (two) times daily. 12/26/17  Yes McKenzie, Mardene Celeste, MD  hydrochlorothiazide (HYDRODIURIL) 25 MG tablet Take 25 mg by mouth every morning. 08/24/21  Yes [provider]  losartan (COZAAR) 100 MG tablet Take 1 tablet (100 mg total) by mouth daily. 12/27/17  Yes McKenzie, Mardene Celeste, MD  meloxicam (MOBIC) 7.5 MG tablet Take 7.5 mg by mouth daily. 10/15/22  Yes [provider]  metoprolol tartrate (LOPRESSOR) 50 MG tablet  Take 1 tablet (50 mg total) by mouth 2 (two) times daily. Patient taking differently: Take 50 mg by mouth every evening. 12/26/17  Yes McKenzie, Mardene Celeste, MD  Multiple Vitamins-Minerals (CENTRUM SILVER 50+WOMEN) TABS Take 1 tablet by mouth daily.   Yes [provider]  omeprazole (PRILOSEC OTC) 20 MG tablet Take 1 tablet (20 mg total) by mouth daily for 15 days. Patient taking differently: Take 20 mg by mouth as needed. 07/14/21 10/31/22 Yes Hongalgi, Maximino Greenland, MD  oxyCODONE (OXY IR/ROXICODONE) 5 MG immediate release tablet Take 5 mg by mouth every 4 (four) hours as needed. 10/15/22  Yes [provider]  vitamin C (ASCORBIC ACID) 500 MG tablet Take 500 mg by mouth daily.   Yes [provider]  aspirin EC 81 MG tablet Take 81 mg by mouth daily.    [provider]  citalopram (CELEXA) 20 MG tablet Take 2 tablets (40 mg total) by mouth daily. 07/14/21   Hongalgi, Maximino Greenland, MD      Allergies    Sulfonamide derivatives and Lisinopril    Review of Systems   Review of Systems  Constitutional:  Negative for chills and fever.  HENT:  Negative for sore throat.   Eyes:  Negative for redness.  Respiratory:  Negative for cough and shortness of breath.   Cardiovascular:  Negative for chest pain.  Gastrointestinal:  Positive for abdominal pain and nausea.  Genitourinary:  Negative for dysuria and flank pain.  Musculoskeletal:  Negative for back pain and neck pain.  Skin:  Negative for rash.  Neurological:  Negative for headaches.  Hematological:  Does not bruise/bleed easily.  Psychiatric/Behavioral:  Negative for confusion.     Physical Exam Updated Vital Signs BP (!) 164/63   Pulse 79   Temp 98.4 F (36.9 C) (Oral)   Resp (!) 21   SpO2 95%  Physical Exam Vitals and nursing note reviewed.  Constitutional:      Appearance: Normal appearance. She is well-developed.  HENT:     Head: Atraumatic.     Nose: Nose normal.     Mouth/Throat:     Mouth: Mucous  membranes are moist.  Eyes:     General: No scleral icterus.    Conjunctiva/sclera: Conjunctivae normal.  Neck:     Trachea: No tracheal deviation.  Cardiovascular:     Rate and Rhythm: Normal rate and regular rhythm.     Pulses: Normal pulses.     Heart sounds: Normal heart sounds. No murmur heard.    No friction rub. No gallop.  Pulmonary:     Effort: Pulmonary effort is normal. No respiratory distress.     Breath sounds: Normal breath sounds.  Abdominal:     General: Bowel sounds are normal. There is no distension.     Palpations: Abdomen is soft. There is no mass.     Tenderness: There is no abdominal tenderness. There is no guarding.  Genitourinary:    Comments: No cva tenderness. Rectal with small amount soft stool, no impaction or mass felt. External hemorrhoids, not acutely inflamed or thrombosed (pt indicates is chronic). Light brown stool.  Musculoskeletal:     Cervical back: Normal range of motion and neck supple. No rigidity. No muscular tenderness.     Comments: Dressing right knee intact. No erythema or increased warmth to skin. No purulent drainage noted. LLE is of normal color and warmth, with intact distal pulses.   Skin:    General: Skin is warm and dry.     Findings: No rash.  Neurological:     Mental Status: She is alert.     Comments: Alert, speech normal.   Psychiatric:        Mood and Affect: Mood normal.     ED Results / Procedures / Treatments   Labs (all labs ordered are listed, but only abnormal results are displayed) Results for orders placed or performed during the hospital encounter of 10/31/22  CBC  Result Value Ref Range   WBC 9.4 4.0 - 10.5 K/uL   RBC 3.27 (L) 3.87 - 5.11 MIL/uL   Hemoglobin 10.3 (L) 12.0 - 15.0 g/dL   HCT 16.1 (L) 09.6 - 04.5 %   MCV 95.1 80.0 - 100.0 fL   MCH 31.5 26.0 - 34.0 pg   MCHC 33.1 30.0 - 36.0 g/dL   RDW 40.9 81.1 - 91.4 %   Platelets 229 150 - 400 K/uL   nRBC 0.0 0.0 - 0.2 %  Comprehensive metabolic panel   Result Value Ref Range   Sodium 137 135 - 145 mmol/L   Potassium 3.3 (L) 3.5 - 5.1 mmol/L   Chloride 99 98 - 111 mmol/L   CO2 27 22 - 32 mmol/L   Glucose, Bld 122 (H) 70 - 99 mg/dL   BUN 14 8 - 23 mg/dL   Creatinine, Ser 7.82 0.44 - 1.00 mg/dL   Calcium 9.0 8.9 - 95.6 mg/dL   Total Protein 6.3 (L) 6.5 - 8.1  g/dL   Albumin 3.9 3.5 - 5.0 g/dL   AST 26 15 - 41 U/L   ALT 25 0 - 44 U/L   Alkaline Phosphatase 64 38 - 126 U/L   Total Bilirubin 1.5 (H) 0.3 - 1.2 mg/dL   GFR, Estimated >69 >62 mL/min   Anion gap 11 5 - 15  Urinalysis, Routine w reflex microscopic -Urine, Clean Catch  Result Value Ref Range   Color, Urine YELLOW YELLOW   APPearance HAZY (A) CLEAR   Specific Gravity, Urine 1.010 1.005 - 1.030   pH 8.0 5.0 - 8.0   Glucose, UA NEGATIVE NEGATIVE mg/dL   Hgb urine dipstick NEGATIVE NEGATIVE   Bilirubin Urine NEGATIVE NEGATIVE   Ketones, ur NEGATIVE NEGATIVE mg/dL   Protein, ur NEGATIVE NEGATIVE mg/dL   Nitrite NEGATIVE NEGATIVE   Leukocytes,Ua TRACE (A) NEGATIVE   RBC / HPF 0-5 0 - 5 RBC/hpf   WBC, UA 0-5 0 - 5 WBC/hpf   Bacteria, UA NONE SEEN NONE SEEN   Squamous Epithelial / HPF 0-5 0 - 5 /HPF   Mucus PRESENT    Budding Yeast PRESENT    Hyphae Yeast PRESENT    CT ABDOMEN PELVIS W CONTRAST  Result Date: 10/31/2022 CLINICAL DATA:  Abdominal pain, acute, nonlocalized. Nausea/vomiting. Constipation. EXAM: CT ABDOMEN AND PELVIS WITH CONTRAST TECHNIQUE: Multidetector CT imaging of the abdomen and pelvis was performed using the standard protocol following bolus administration of intravenous contrast. RADIATION DOSE REDUCTION: This exam was performed according to the departmental dose-optimization program which includes automated exposure control, adjustment of the mA and/or kV according to patient size and/or use of iterative reconstruction technique. CONTRAST:  OMNIPAQUE IOHEXOL 300 MG/ML  SOLN COMPARISON:  CT scan abdomen and pelvis from 07/13/2021. FINDINGS: Lower  chest: The lung bases are clear. No pleural effusion. The heart is normal in size. No pericardial effusion. Hepatobiliary: The liver is normal in size. Non-cirrhotic configuration. No suspicious mass. These is mild diffuse hepatic steatosis. No intrahepatic or extrahepatic bile duct dilation. No calcified gallstones. Normal gallbladder wall thickness. No pericholecystic inflammatory changes. Pancreas: Unremarkable. No pancreatic ductal dilatation or surrounding inflammatory changes. Spleen: Within normal limits. No focal lesion. Adrenals/Urinary Tract: Adrenal glands are unremarkable. No suspicious renal mass. No hydronephrosis. Bilateral extrarenal pelves noted. There is a stable 8 mm nonobstructing calculus in the left kidney interpolar region. No other nephroureterolithiasis or obstructive uropathy. Unremarkable urinary bladder. Stomach/Bowel: No disproportionate dilation of the small or large bowel loops. No evidence of abnormal bowel wall thickening or inflammatory changes. The appendix is unremarkable. There are multiple diverticula mainly in the left hemi colon, without imaging signs of diverticulitis. Vascular/Lymphatic: No ascites or pneumoperitoneum. No abdominal or pelvic lymphadenopathy, by size criteria. No aneurysmal dilation of the major abdominal arteries. There are moderate peripheral atherosclerotic vascular calcifications of the aorta and its major branches. Reproductive: The uterus is unremarkable. No large adnexal mass. Other: There is a tiny fat containing umbilical hernia. The soft tissues and abdominal wall are otherwise unremarkable. Musculoskeletal: No suspicious osseous lesions. There are mild - moderate multilevel degenerative changes in the visualized spine. IMPRESSION: 1. No acute inflammatory process identified within the abdomen or pelvis. No bowel obstruction. 2. Stable 8 mm nonobstructing left renal calculus. 3. Multiple other nonacute observations, as described above. Aortic  Atherosclerosis (ICD10-I70.0). Electronically Signed   By: Jules Schick M.D.   On: 10/31/2022 15:11   CT Head Wo Contrast  Result Date: 10/28/2022 CLINICAL DATA:  Mental status changes.  Recent  knee replacement. EXAM: CT HEAD WITHOUT CONTRAST TECHNIQUE: Contiguous axial images were obtained from the base of the skull through the vertex without intravenous contrast. RADIATION DOSE REDUCTION: This exam was performed according to the departmental dose-optimization program which includes automated exposure control, adjustment of the mA and/or kV according to patient size and/or use of iterative reconstruction technique. COMPARISON:  07/13/2021 FINDINGS: Brain: No mass lesion, hemorrhage, hydrocephalus, acute infarct, intra-axial, or extra-axial fluid collection. Vascular: No hyperdense vessel or unexpected calcification. Skull: Normal Sinuses/Orbits: Normal imaged portions of the orbits and globes. Clear paranasal sinuses and mastoid air cells. Other: None. IMPRESSION: No acute intracranial abnormality. Electronically Signed   By: Jeronimo Greaves M.D.   On: 10/28/2022 14:38    ED ECG REPORT   Date: 10/31/2022  Rate: 70  Rhythm: normal sinus rhythm  QRS Axis: left  Intervals: normal  ST/T Wave abnormalities: nonspecific T wave changes  Conduction Disutrbances:none  Narrative Interpretation:   Old EKG Reviewed: changes noted  I have personally reviewed the EKG tracing    Radiology CT ABDOMEN PELVIS W CONTRAST  Result Date: 10/31/2022 CLINICAL DATA:  Abdominal pain, acute, nonlocalized. Nausea/vomiting. Constipation. EXAM: CT ABDOMEN AND PELVIS WITH CONTRAST TECHNIQUE: Multidetector CT imaging of the abdomen and pelvis was performed using the standard protocol following bolus administration of intravenous contrast. RADIATION DOSE REDUCTION: This exam was performed according to the departmental dose-optimization program which includes automated exposure control, adjustment of the mA and/or kV  according to patient size and/or use of iterative reconstruction technique. CONTRAST:  OMNIPAQUE IOHEXOL 300 MG/ML  SOLN COMPARISON:  CT scan abdomen and pelvis from 07/13/2021. FINDINGS: Lower chest: The lung bases are clear. No pleural effusion. The heart is normal in size. No pericardial effusion. Hepatobiliary: The liver is normal in size. Non-cirrhotic configuration. No suspicious mass. These is mild diffuse hepatic steatosis. No intrahepatic or extrahepatic bile duct dilation. No calcified gallstones. Normal gallbladder wall thickness. No pericholecystic inflammatory changes. Pancreas: Unremarkable. No pancreatic ductal dilatation or surrounding inflammatory changes. Spleen: Within normal limits. No focal lesion. Adrenals/Urinary Tract: Adrenal glands are unremarkable. No suspicious renal mass. No hydronephrosis. Bilateral extrarenal pelves noted. There is a stable 8 mm nonobstructing calculus in the left kidney interpolar region. No other nephroureterolithiasis or obstructive uropathy. Unremarkable urinary bladder. Stomach/Bowel: No disproportionate dilation of the small or large bowel loops. No evidence of abnormal bowel wall thickening or inflammatory changes. The appendix is unremarkable. There are multiple diverticula mainly in the left hemi colon, without imaging signs of diverticulitis. Vascular/Lymphatic: No ascites or pneumoperitoneum. No abdominal or pelvic lymphadenopathy, by size criteria. No aneurysmal dilation of the major abdominal arteries. There are moderate peripheral atherosclerotic vascular calcifications of the aorta and its major branches. Reproductive: The uterus is unremarkable. No large adnexal mass. Other: There is a tiny fat containing umbilical hernia. The soft tissues and abdominal wall are otherwise unremarkable. Musculoskeletal: No suspicious osseous lesions. There are mild - moderate multilevel degenerative changes in the visualized spine. IMPRESSION: 1. No acute  inflammatory process identified within the abdomen or pelvis. No bowel obstruction. 2. Stable 8 mm nonobstructing left renal calculus. 3. Multiple other nonacute observations, as described above. Aortic Atherosclerosis (ICD10-I70.0). Electronically Signed   By: Jules Schick M.D.   On: 10/31/2022 15:11    Procedures Procedures    Medications Ordered in ED Medications  lactated ringers bolus 1,000 mL (0 mLs Intravenous Stopped 10/31/22 1008)  ondansetron (ZOFRAN) injection 4 mg (4 mg Intravenous Given 10/31/22 0907)  potassium chloride SA (  KLOR-CON M) CR tablet 40 mEq (40 mEq Oral Given 10/31/22 1003)  lactated ringers bolus 1,000 mL (0 mLs Intravenous Stopped 10/31/22 1214)  bisacodyl (DULCOLAX) suppository 10 mg (10 mg Rectal Given 10/31/22 1059)  iohexol (OMNIPAQUE) 300 MG/ML solution 100 mL (100 mLs Intravenous Contrast Given 10/31/22 1225)  morphine (PF) 4 MG/ML injection 4 mg (4 mg Intravenous Given 10/31/22 1338)  ondansetron (ZOFRAN) injection 4 mg (4 mg Intravenous Given 10/31/22 1338)    ED Course/ Medical Decision Making/ A&P                                 Medical Decision Making Problems Addressed: Diverticula of colon: chronic illness or injury Elevated blood pressure reading: acute illness or injury External hemorrhoids: chronic illness or injury Generalized abdominal pain: acute illness or injury with systemic symptoms that poses a threat to life or bodily functions Kidney stone: chronic illness or injury Nausea and vomiting in adult: acute illness or injury with systemic symptoms that poses a threat to life or bodily functions Other constipation: acute illness or injury S/P total knee arthroplasty, right: acute illness or injury  Amount and/or Complexity of Data Reviewed Independent Historian: spouse    Details: hx External Data Reviewed: notes. Labs: ordered. Decision-making details documented in ED Course. Radiology: ordered and independent interpretation performed.  Decision-making details documented in ED Course. ECG/medicine tests: ordered and independent interpretation performed. Decision-making details documented in ED Course.  Risk OTC drugs. Prescription drug management. Parenteral controlled substances. Decision regarding hospitalization.   Iv ns. Continuous pulse ox and cardiac monitoring. Labs ordered/sent.  Differential diagnosis includes dehydration, constipation, aki, med side effect, etc. Dispo decision including potential need for admission considered - will get labs and imaging and reassess.   Reviewed nursing notes and prior charts for additional history. External reports reviewed. Additional history from: spouse  Cardiac monitor: sinus rhythm, rate 74.  LR bolus. Zofran iv.   Labs reviewed/interpreted by me - wbc normal. Chem normal x k sl low. Kcl po.   Pt noted increased abd pain/tenderness. Imaging added. Morphine iv, zofran iv, ivf bolus.  CT reviewed/interpreted by me - no sbo.   Recheck pt, no emesis. Appears comfortable. Nad.   Pt currently appears stable for d/c.   Rec close pcp f/u.  Return precautions provided.          Final Clinical Impression(s) / ED Diagnoses Final diagnoses:  None    Rx / DC Orders ED Discharge Orders     None         Cathren Laine, MD 10/31/22 1521

## 2022-10-31 NOTE — ED Notes (Signed)
Unsuccessful IV attempt, RT hand, labs collected

## 2022-11-05 DIAGNOSIS — M25661 Stiffness of right knee, not elsewhere classified: Secondary | ICD-10-CM | POA: Diagnosis not present

## 2022-11-08 DIAGNOSIS — M25661 Stiffness of right knee, not elsewhere classified: Secondary | ICD-10-CM | POA: Diagnosis not present

## 2022-11-08 DIAGNOSIS — Z96651 Presence of right artificial knee joint: Secondary | ICD-10-CM | POA: Diagnosis not present

## 2022-11-21 DIAGNOSIS — M25661 Stiffness of right knee, not elsewhere classified: Secondary | ICD-10-CM | POA: Diagnosis not present

## 2022-11-21 DIAGNOSIS — Z96651 Presence of right artificial knee joint: Secondary | ICD-10-CM | POA: Diagnosis not present

## 2022-11-28 DIAGNOSIS — M25661 Stiffness of right knee, not elsewhere classified: Secondary | ICD-10-CM | POA: Diagnosis not present

## 2022-11-28 DIAGNOSIS — Z96651 Presence of right artificial knee joint: Secondary | ICD-10-CM | POA: Diagnosis not present

## 2022-11-29 ENCOUNTER — Ambulatory Visit
Admission: RE | Admit: 2022-11-29 | Discharge: 2022-11-29 | Disposition: A | Discharge status: Home / Self Care | Payer: Medicare HMO | Source: Ambulatory Visit | Attending: Nurse Practitioner | Admitting: Nurse Practitioner

## 2022-11-29 DIAGNOSIS — Z1231 Encounter for screening mammogram for malignant neoplasm of breast: Secondary | ICD-10-CM | POA: Diagnosis not present

## 2022-12-10 DIAGNOSIS — R112 Nausea with vomiting, unspecified: Secondary | ICD-10-CM | POA: Diagnosis not present

## 2022-12-10 DIAGNOSIS — R0609 Other forms of dyspnea: Secondary | ICD-10-CM | POA: Diagnosis not present

## 2022-12-24 DIAGNOSIS — H353111 Nonexudative age-related macular degeneration, right eye, early dry stage: Secondary | ICD-10-CM | POA: Diagnosis not present

## 2022-12-24 DIAGNOSIS — H35361 Drusen (degenerative) of macula, right eye: Secondary | ICD-10-CM | POA: Diagnosis not present

## 2022-12-24 DIAGNOSIS — H25013 Cortical age-related cataract, bilateral: Secondary | ICD-10-CM | POA: Diagnosis not present

## 2022-12-26 DIAGNOSIS — R0609 Other forms of dyspnea: Secondary | ICD-10-CM | POA: Diagnosis not present

## 2022-12-30 DIAGNOSIS — R112 Nausea with vomiting, unspecified: Secondary | ICD-10-CM | POA: Diagnosis not present

## 2022-12-30 DIAGNOSIS — R2689 Other abnormalities of gait and mobility: Secondary | ICD-10-CM | POA: Diagnosis not present

## 2022-12-30 DIAGNOSIS — I471 Supraventricular tachycardia, unspecified: Secondary | ICD-10-CM | POA: Diagnosis not present

## 2022-12-30 DIAGNOSIS — F439 Reaction to severe stress, unspecified: Secondary | ICD-10-CM | POA: Diagnosis not present

## 2022-12-30 DIAGNOSIS — R197 Diarrhea, unspecified: Secondary | ICD-10-CM | POA: Diagnosis not present

## 2022-12-30 DIAGNOSIS — R0609 Other forms of dyspnea: Secondary | ICD-10-CM | POA: Diagnosis not present

## 2023-01-30 DIAGNOSIS — M7051 Other bursitis of knee, right knee: Secondary | ICD-10-CM | POA: Diagnosis not present

## 2023-03-07 DIAGNOSIS — H6121 Impacted cerumen, right ear: Secondary | ICD-10-CM | POA: Diagnosis not present

## 2023-03-17 DIAGNOSIS — L659 Nonscarring hair loss, unspecified: Secondary | ICD-10-CM | POA: Diagnosis not present

## 2023-04-23 ENCOUNTER — Ambulatory Visit
Admission: RE | Admit: 2023-04-23 | Discharge: 2023-04-23 | Disposition: A | Discharge status: Home / Self Care | Payer: Medicare HMO | Source: Ambulatory Visit | Attending: Nurse Practitioner | Admitting: Nurse Practitioner

## 2023-04-23 DIAGNOSIS — N958 Other specified menopausal and perimenopausal disorders: Secondary | ICD-10-CM | POA: Diagnosis not present

## 2023-04-23 DIAGNOSIS — E2839 Other primary ovarian failure: Secondary | ICD-10-CM | POA: Diagnosis not present

## 2023-04-23 DIAGNOSIS — M85832 Other specified disorders of bone density and structure, left forearm: Secondary | ICD-10-CM

## 2023-08-11 DIAGNOSIS — E785 Hyperlipidemia, unspecified: Secondary | ICD-10-CM | POA: Diagnosis not present

## 2023-08-11 DIAGNOSIS — F322 Major depressive disorder, single episode, severe without psychotic features: Secondary | ICD-10-CM | POA: Diagnosis not present

## 2023-08-11 DIAGNOSIS — E669 Obesity, unspecified: Secondary | ICD-10-CM | POA: Diagnosis not present

## 2023-08-11 DIAGNOSIS — I10 Essential (primary) hypertension: Secondary | ICD-10-CM | POA: Diagnosis not present

## 2023-09-11 DIAGNOSIS — E785 Hyperlipidemia, unspecified: Secondary | ICD-10-CM | POA: Diagnosis not present

## 2023-09-11 DIAGNOSIS — E669 Obesity, unspecified: Secondary | ICD-10-CM | POA: Diagnosis not present

## 2023-09-11 DIAGNOSIS — F322 Major depressive disorder, single episode, severe without psychotic features: Secondary | ICD-10-CM | POA: Diagnosis not present

## 2023-09-11 DIAGNOSIS — I10 Essential (primary) hypertension: Secondary | ICD-10-CM | POA: Diagnosis not present

## 2023-09-16 DIAGNOSIS — R0609 Other forms of dyspnea: Secondary | ICD-10-CM | POA: Diagnosis not present

## 2023-10-12 DIAGNOSIS — E785 Hyperlipidemia, unspecified: Secondary | ICD-10-CM | POA: Diagnosis not present

## 2023-10-12 DIAGNOSIS — E669 Obesity, unspecified: Secondary | ICD-10-CM | POA: Diagnosis not present

## 2023-10-12 DIAGNOSIS — I10 Essential (primary) hypertension: Secondary | ICD-10-CM | POA: Diagnosis not present

## 2023-10-12 DIAGNOSIS — F322 Major depressive disorder, single episode, severe without psychotic features: Secondary | ICD-10-CM | POA: Diagnosis not present

## 2023-10-13 NOTE — Progress Notes (Unsigned)
 Electrophysiology Office Note:   Date:  10/14/2023  ID:  Sheryl Bates, DOB 1955-09-05, MRN [id number]  Primary Cardiologist: Alm Clay, MD Electrophysiologist: Fonda Kitty, MD      History of Present Illness:   Sheryl Bates is a 68 y.o. female with h/o hypertension, palpitations who is being seen today for evaluation of SVT.   Discussed the use of AI scribe software for clinical note transcription with the patient, who gave verbal consent to proceed.  History of Present Illness Sheryl Bates is a 68 year old female who presents with episodes of heart palpitations and high blood pressure.  She experiences episodes of heart palpitations characterized by a sudden acceleration of heart rate, described as 'heart's racing', accompanied by shortness of breath and fatigue. These episodes have been occurring for a while but have worsened recently, limiting her ability to walk far without needing to stop due to breathlessness and a racing heart.  A heart monitor showed episodes where her heart rate suddenly increased, with the longest episode lasting about a minute and a half. She experiences a sensation of fluttering in her chest during these episodes, which are sometimes brief, lasting only a few seconds.  She is currently taking amlodipine  5 mg once daily for high blood pressure, which remains elevated despite this treatment. Her blood pressure readings at home have been concerning. No recent changes have been made to her blood pressure medication.  Family history is significant for cardiovascular events; her grandfather died of a cerebral hemorrhage, and her mother had a stroke or heart attack.   Review of systems complete and found to be negative unless listed in HPI.   EP Information / Studies Reviewed:    EKG is ordered today. Personal review as below.  EKG Interpretation Date/Time:  Tuesday October 14 2023 13:59:38 EDT Ventricular Rate:  49 PR  Interval:  200 QRS Duration:  84 QT Interval:  472 QTC Calculation: 426 R Axis:   -37  Text Interpretation: Sinus bradycardia Left axis deviation Cannot rule out Anterior infarct , age undetermined When compared with ECG of 13-Jul-2021 09:49, PREVIOUS ECG IS PRESENT Confirmed by Kitty Fonda [phone]) on 10/14/2023 2:56:29 PM   Zio 11/2022:   Echo 07/27/21:   1. Left ventricular ejection fraction, by estimation, is 65 to 70%. The  left ventricle has normal function. The left ventricle has no regional  wall motion abnormalities. There is moderate concentric left ventricular  hypertrophy. Left ventricular  diastolic parameters are indeterminate.   2. Right ventricular systolic function is normal. The right ventricular  size is normal. There is normal pulmonary artery systolic pressure.   3. The mitral valve is normal in structure. Trivial mitral valve  regurgitation. No evidence of mitral stenosis.   4. The aortic valve is tricuspid. Aortic valve regurgitation is not  visualized. No aortic stenosis is present.   5. The inferior vena cava is normal in size with greater than 50%  respiratory variability, suggesting right atrial pressure of 3 mmHg.       Physical Exam:   VS:  BP 131/77   Pulse (!) 49   Ht 5' 3 (1.6 m)   Wt 186 lb 6.4 oz (84.6 kg)   SpO2 99%   BMI 33.02 kg/m    Wt Readings from Last 3 Encounters:  10/14/23 186 lb 6.4 oz (84.6 kg)  10/08/21 187 lb 3.2 oz (84.9 kg)  07/16/21 181 lb 3.2 oz (82.2 kg)     GEN: Well  nourished, well developed in no acute distress NECK: No JVD CARDIAC: Bradycardic, regular rhythm RESPIRATORY:  Clear to auscultation without rales, wheezing or rhonchi  ABDOMEN: Soft, non-distended EXTREMITIES:  No edema; No deformity   ASSESSMENT AND PLAN:    #SVT: Zio monitor suspicious for atrial tachycardia. #Palpitations:  -No room to uptitrate AV nodal blocking agents given bradycardia.  Continue metoprolol . -We will order a nuclear stress  test.  If stress test is normal, then plan to start flecainide 50 mg twice daily for suppression.  Will back off metoprolol  to 25 mg daily at time of initiating flecainide.   -Will order an updated echocardiogram. -If flecainide unsuccessful then we will consider EP study +/- ablation.  #Hypertension -At goal today but elevated readings at home based on her log.  Will increase amlodipine  to 10 mg once daily.  Recommend checking blood pressures 1-2 times per week at home and recording the values.  Recommend bringing these recordings to the primary care physician.   Follow up with EP APP in 6 weeks.   Signed, Fonda Kitty, MD

## 2023-10-14 ENCOUNTER — Encounter: Payer: Self-pay | Admitting: Cardiology

## 2023-10-14 ENCOUNTER — Ambulatory Visit: Attending: Cardiology | Admitting: Cardiology

## 2023-10-14 VITALS — BP 131/77 | HR 49 | Ht 63.0 in | Wt 186.4 lb

## 2023-10-14 DIAGNOSIS — I10 Essential (primary) hypertension: Secondary | ICD-10-CM

## 2023-10-14 DIAGNOSIS — R002 Palpitations: Secondary | ICD-10-CM | POA: Diagnosis not present

## 2023-10-14 DIAGNOSIS — I471 Supraventricular tachycardia, unspecified: Secondary | ICD-10-CM | POA: Diagnosis not present

## 2023-10-14 MED ORDER — AMLODIPINE BESYLATE 10 MG PO TABS: 10.0000 mg | ORAL_TABLET | Freq: Every day | ORAL | 3 refills | Status: AC

## 2023-10-14 NOTE — Patient Instructions (Addendum)
 Medication Instructions:  Your physician has recommended you make the following change in your medication:  1) INCREASE amlodipine  to 10 mg once daily *If you need a refill on your cardiac medications before your next appointment, please call your pharmacy*  Testing/Procedures: Echocardiogram  Your physician has requested that you have an echocardiogram. Echocardiography is a painless test that uses sound waves to create images of your heart. It provides your doctor with information about the size and shape of your heart and how well your heart's chambers and valves are working. This procedure takes approximately one hour. There are no restrictions for this procedure. Please do NOT wear cologne, perfume, aftershave, or lotions (deodorant is allowed). Please arrive 15 minutes prior to your appointment time.  Please note: We ask at that you not bring children with you during ultrasound (echo/ vascular) testing. Due to room size and safety concerns, children are not allowed in the ultrasound rooms during exams. Our front office staff cannot provide observation of children in our lobby area while testing is being conducted. An adult accompanying a patient to their appointment will only be allowed in the ultrasound room at the discretion of the ultrasound technician under special circumstances. We apologize for any inconvenience.  Stress Test - [facility] Your physician has requested that you have a lexiscan myoview. For further information please visit [email or web address] Please follow instruction sheet, as given.  Follow-Up: At [facility], you and your health needs are our priority.  As part of our continuing mission to provide you with exceptional heart care, our providers are all part of one team.  This team includes your primary Cardiologist (physician) and Advanced Practice Providers or APPs (Physician Assistants and Nurse Practitioners) who all work together to provide you with  the care you need, when you need it.  Your next appointment:   6-7 weeks  Provider:   You will see one of the following Advanced Practice Providers on your designated Care Team:   Charlies Arthur, PA-C Michael Andy Tillery, PA-C Suzann Riddle, NP Daphne Barrack, NP

## 2023-11-06 ENCOUNTER — Encounter (HOSPITAL_COMMUNITY): Payer: Self-pay | Admitting: *Deleted

## 2023-11-11 DIAGNOSIS — E669 Obesity, unspecified: Secondary | ICD-10-CM | POA: Diagnosis not present

## 2023-11-11 DIAGNOSIS — F322 Major depressive disorder, single episode, severe without psychotic features: Secondary | ICD-10-CM | POA: Diagnosis not present

## 2023-11-11 DIAGNOSIS — E785 Hyperlipidemia, unspecified: Secondary | ICD-10-CM | POA: Diagnosis not present

## 2023-11-11 DIAGNOSIS — I10 Essential (primary) hypertension: Secondary | ICD-10-CM | POA: Diagnosis not present

## 2023-11-13 ENCOUNTER — Other Ambulatory Visit: Payer: Self-pay

## 2023-11-13 DIAGNOSIS — I471 Supraventricular tachycardia, unspecified: Secondary | ICD-10-CM

## 2023-11-14 ENCOUNTER — Other Ambulatory Visit: Payer: Self-pay | Admitting: Cardiology

## 2023-11-14 DIAGNOSIS — I10 Essential (primary) hypertension: Secondary | ICD-10-CM

## 2023-11-20 ENCOUNTER — Ambulatory Visit (HOSPITAL_COMMUNITY)
Admission: RE | Admit: 2023-11-20 | Discharge: 2023-11-20 | Disposition: A | Discharge status: Home / Self Care | Source: Ambulatory Visit | Attending: Cardiology | Admitting: Cardiology

## 2023-11-20 ENCOUNTER — Ambulatory Visit (HOSPITAL_BASED_OUTPATIENT_CLINIC_OR_DEPARTMENT_OTHER)
Admission: RE | Admit: 2023-11-20 | Discharge: 2023-11-20 | Disposition: A | Discharge status: Home / Self Care | Source: Ambulatory Visit | Attending: Cardiology | Admitting: Cardiology

## 2023-11-20 DIAGNOSIS — I700 Atherosclerosis of aorta: Secondary | ICD-10-CM | POA: Insufficient documentation

## 2023-11-20 DIAGNOSIS — I2510 Atherosclerotic heart disease of native coronary artery without angina pectoris: Secondary | ICD-10-CM | POA: Diagnosis not present

## 2023-11-20 DIAGNOSIS — I471 Supraventricular tachycardia, unspecified: Secondary | ICD-10-CM | POA: Insufficient documentation

## 2023-11-20 DIAGNOSIS — E785 Hyperlipidemia, unspecified: Secondary | ICD-10-CM | POA: Diagnosis not present

## 2023-11-20 DIAGNOSIS — K760 Fatty (change of) liver, not elsewhere classified: Secondary | ICD-10-CM | POA: Diagnosis not present

## 2023-11-20 DIAGNOSIS — I10 Essential (primary) hypertension: Secondary | ICD-10-CM

## 2023-11-20 LAB — ECHOCARDIOGRAM COMPLETE
Area-P 1/2: 4.49 cm2
S' Lateral: 2.4 cm

## 2023-11-20 MED ORDER — TECHNETIUM TC 99M TETROFOSMIN IV KIT
31.0000 | PACK | Freq: Once | INTRAVENOUS | Status: AC | PRN
  Administered 2023-11-20: 31 via INTRAVENOUS

## 2023-11-20 MED ORDER — TECHNETIUM TC 99M TETROFOSMIN IV KIT
10.9000 | PACK | Freq: Once | INTRAVENOUS | Status: AC | PRN
  Administered 2023-11-20: 10.9 via INTRAVENOUS

## 2023-11-20 MED ORDER — REGADENOSON 0.4 MG/5ML IV SOLN
INTRAVENOUS | Status: AC
  Filled 2023-11-20: qty 5

## 2023-11-20 MED ORDER — PERFLUTREN LIPID MICROSPHERE
1.0000 mL | INTRAVENOUS | Status: DC | PRN
Start: 2023-11-20 — End: 2023-11-20
  Administered 2023-11-20: 2 mL via INTRAVENOUS

## 2023-11-20 MED ORDER — REGADENOSON 0.4 MG/5ML IV SOLN
0.4000 mg | Freq: Once | INTRAVENOUS | Status: AC
  Administered 2023-11-20: 0.4 mg via INTRAVENOUS

## 2023-11-21 LAB — MYOCARDIAL PERFUSION IMAGING
Base ST Depression (mm): 0 mm
LV dias vol: 80 mL (ref 46–106)
LV sys vol: 25 mL (ref 3.8–5.2)
Nuc Stress EF: 69 %
Peak HR: 67 {beats}/min
Rest HR: 52 {beats}/min
Rest Nuclear Isotope Dose: 10.9 mCi
SDS: 0
SRS: 2
SSS: 0
ST Depression (mm): 0 mm
Stress Nuclear Isotope Dose: 31 mCi
TID: 0.78

## 2023-11-23 NOTE — Progress Notes (Unsigned)
 Cardiology Office Note:  .   Date:  11/23/2023  ID:  Bartley Klinefelter Bargo, DOB 01-Jun-1955, MRN [id number] PCP: Cleotilde, [place]  E, PA  [facility] Providers Cardiologist:  Alm Clay, MD Electrophysiologist:  Fonda Kitty, MD {  History of Present Illness: .   Sheryl Bates is a 68 y.o. female w/PMHx of  HTN (w/hx of hypertensive urgency), HLD SVt  She was referred to EP fo management recommendations for her SVT Palpitations reported to go back some time though increasingly bothersome and frequent In review of her monitor (see his note), suspected Atach Planned for echo and stress test > if normal > flecainide 50mg  BID reducing her metoprolol  to 25mg  daily. Her amlodipine  increased for high BP  Stress was negative/low risk, echo noted preserved LVEF   Today's visit is scheduled to f/u on test reuslt ROS:   *** starte flec/reduce BB *** f/u RN visit and stresst   Arrhythmia/AAD hx SVT  Studies Reviewed: SABRA    EKG done today and reviewed by myself:  ***   11/20/23: stress myoview   The study is normal. The study is low risk.   No ST deviation was noted.   LV perfusion is normal. There is no evidence of ischemia. There is no evidence of infarction.   Left ventricular function is normal. End diastolic cavity size is normal. End systolic cavity size is normal.   CT images were obtained for attenuation correction and were examined for the presence of coronary calcium when appropriate.   Coronary calcium was present on the attenuation correction CT images. Moderate coronary calcifications were present. Coronary calcifications were present in the left anterior descending artery, left circumflex artery and right coronary artery distribution(s).    11/20/23: TTE 1. Left ventricular ejection fraction, by estimation, is 60 to 65%. The  left ventricle has normal function. The left ventricle has no regional  wall motion abnormalities. There is mild left ventricular  hypertrophy.  Left ventricular diastolic parameters  were normal.   2. Right ventricular systolic function is normal. The right ventricular  size is normal.   3. The mitral valve is normal in structure. No evidence of mitral valve  regurgitation. No evidence of mitral stenosis.   4. The aortic valve was not well visualized. Aortic valve regurgitation  is not visualized. No aortic stenosis is present.   5. The inferior vena cava is normal in size with greater than 50%  respiratory variability, suggesting right atrial pressure of 3 mmHg.     CPX 08/09/2021: Normal functional capacity with no evidence of significant cardiopulmonary limitation to exercise. Patient's body habitus/obesity likely playing major role in exertional dyspnea.There was a marked hypertensive response to exercise   TTE 07/27/2021:  1. Left ventricular ejection fraction, by estimation, is 65 to 70%. The left ventricle has normal function. The left ventricle has no regional wall motion abnormalities. There is moderate concentric left ventricular hypertrophy. Left ventricular diastolic parameters are indeterminate.   2. Right ventricular systolic function is normal. The right ventricular size is normal. There is normal pulmonary artery systolic pressure. Normal LA & RA w/ normal RAP.  3. The mitral valve is normal in structure. Trivial mitral valve regurgitation. No evidence of mitral stenosis.  4. The aortic valve is tricuspid. Aortic valve regurgitation is not visualized. No aortic stenosis is present. 5. The inferior vena cava is normal in size with greater than 50% respiratory variability, suggesting right atrial pressure of 3 mmHg   Risk Assessment/Calculations:  Physical Exam:   VS:  There were no vitals taken for this visit.   Wt Readings from Last 3 Encounters:  10/14/23 186 lb 6.4 oz (84.6 kg)  10/08/21 187 lb 3.2 oz (84.9 kg)  07/16/21 181 lb 3.2 oz (82.2 kg)    GEN: Well nourished, well developed in no acute  distress NECK: No JVD; No carotid bruits CARDIAC: ***RRR, no murmurs, rubs, gallops RESPIRATORY:  *** CTA b/l without rales, wheezing or rhonchi  ABDOMEN: Soft, non-tender, non-distended EXTREMITIES: *** No edema; No deformity   ASSESSMENT AND PLAN: .    SVT ***  HTN ***     {Are you ordering a CV Procedure (e.g. stress test, cath, DCCV, TEE, etc)?   Press F2        :[id number]}     Dispo: ***  Signed, Charlies Macario Arthur, PA-C

## 2023-11-24 ENCOUNTER — Ambulatory Visit: Payer: Self-pay | Admitting: Cardiology

## 2023-11-25 ENCOUNTER — Ambulatory Visit: Attending: Physician Assistant | Admitting: Physician Assistant

## 2023-11-25 VITALS — BP 144/78 | HR 63 | Ht 63.0 in | Wt 188.0 lb

## 2023-11-25 DIAGNOSIS — I471 Supraventricular tachycardia, unspecified: Secondary | ICD-10-CM | POA: Diagnosis not present

## 2023-11-25 DIAGNOSIS — I10 Essential (primary) hypertension: Secondary | ICD-10-CM

## 2023-11-25 MED ORDER — HYDRALAZINE HCL 25 MG PO TABS: 25.0000 mg | ORAL_TABLET | Freq: Two times a day (BID) | ORAL | Status: AC

## 2023-11-25 MED ORDER — METOPROLOL SUCCINATE ER 25 MG PO TB24: 25.0000 mg | ORAL_TABLET | Freq: Every day | ORAL | 3 refills | Status: AC

## 2023-11-25 MED ORDER — FLECAINIDE ACETATE 50 MG PO TABS: 50.0000 mg | ORAL_TABLET | Freq: Two times a day (BID) | ORAL | 3 refills | Status: AC

## 2023-11-25 MED ORDER — OMEPRAZOLE MAGNESIUM 20 MG PO TBEC: 20.0000 mg | DELAYED_RELEASE_TABLET | ORAL | Status: DC | PRN

## 2023-11-25 NOTE — Patient Instructions (Addendum)
 Medication Instructions:    START TAKING : FLECAINIDE  50 MG TWICE A DAY    START  TAKING :  TOPROL  25 MG ONCE  A DAY    STOP TAKING :    METOPROLOL   50 MG    *If you need a refill on your cardiac medications before your next appointment, please call your pharmacy*    Lab Work: NONE ORDERED  TODAY    If you have labs (blood work) drawn today and your tests are completely normal, you will receive your results only by: MyChart Message (if you have MyChart) OR A paper copy in the mail If you have any lab test that is abnormal or we need to change your treatment, we will call you to review the results.   Testing/Procedures: NONE ORDERED  TODAY       Follow-Up: At [facility], you and your health needs are our priority.  As part of our continuing mission to provide you with exceptional heart care, our providers are all part of one team.  This team includes your primary Cardiologist (physician) and Advanced Practice Providers or APPs (Physician Assistants and Nurse Practitioners) who all work together to provide you with the care you need, when you need it.  Your next appointment:   NURSE VISIT  WITH EKG  ( NEW MEDICATION START ) IN  7-10 DAYS    1 month(s)  Provider:   Charlies Arthur, PA-C  ( CONTACT  CASSIE HALL/ ANGELINE HAMMER FOR EP SCHEDULING ISSUES )      We recommend signing up for the patient portal called MyChart.  Sign up information is provided on this After Visit Summary.  MyChart is used to connect with patients for Virtual Visits (Telemedicine).  Patients are able to view lab/test results, encounter notes, upcoming appointments, etc.  Non-urgent messages can be sent to your provider as well.   To learn more about what you can do with MyChart, go to [email or web address].   Other Instructions

## 2023-12-09 ENCOUNTER — Ambulatory Visit: Attending: Internal Medicine | Admitting: *Deleted

## 2023-12-09 VITALS — BP 122/58 | HR 54 | Ht 63.0 in | Wt 189.0 lb

## 2023-12-09 DIAGNOSIS — I471 Supraventricular tachycardia, unspecified: Secondary | ICD-10-CM

## 2023-12-09 DIAGNOSIS — Z79899 Other long term (current) drug therapy: Secondary | ICD-10-CM

## 2023-12-10 NOTE — Progress Notes (Addendum)
   Nurse Visit   Date of Encounter: 12/09/2023 ID: Sheryl Bates, DOB [birth year], MRN [id number]  PCP:  Cleotilde Ernestina BRAVO, PA   [facility] Providers Cardiologist:  Alm Clay, MD Electrophysiologist:  Fonda Kitty, MD      Visit Details   VS:  BP (!) 122/58   Pulse (!) 54   Ht 5' 3 (1.6 m)   Wt 189 lb (85.7 kg)   BMI 33.48 kg/m  , BMI Body mass index is 33.48 kg/m.  Wt Readings from Last 3 Encounters:  12/09/23 189 lb (85.7 kg)  11/25/23 188 lb (85.3 kg)  10/14/23 186 lb 6.4 oz (84.6 kg)     Reason for visit: EKG after starting flecainide on 11/25/23 Performed today: Vitals, EKG, Provider consulted: Dr. Mona (DOD), and Education Changes (medications, testing, etc.): no changes Length of Visit: 30 minutes  EKG reviewed by Dr. Mona (DOD). No medication changes recommended; patient may continue flecainide. Patient aware and agreeable to plan.  Medications Adjustments/Labs and Tests Ordered: Orders Placed This Encounter  Procedures   EKG 12-Lead   No orders of the defined types were placed in this encounter.    Signed,   Damien JAYSON Maid, RN  12/10/2023 8:53 AM    ADDENDUM:   EKG personally reviewed, demonstrates normal sinsu rhythm with 1st degree AVB (new), however, stable-normal QTc and QTS on flecainide.  Ok to continue current medication dose.  Vinie KYM Mona, MD, [facility], FNLA, FACP  [place]  [facility]  Medical Director of the [facility] Lipid [facility] &  Cardiovascular Risk Reduction Clinic Diplomate of the American Board of Clinical Lipidology Attending Cardiologist  Direct Dial: [phone]  Fax: [phone]  Website:  [email or web address]

## 2023-12-12 DIAGNOSIS — E785 Hyperlipidemia, unspecified: Secondary | ICD-10-CM | POA: Diagnosis not present

## 2023-12-12 DIAGNOSIS — E669 Obesity, unspecified: Secondary | ICD-10-CM | POA: Diagnosis not present

## 2023-12-12 DIAGNOSIS — F322 Major depressive disorder, single episode, severe without psychotic features: Secondary | ICD-10-CM | POA: Diagnosis not present

## 2023-12-12 DIAGNOSIS — I10 Essential (primary) hypertension: Secondary | ICD-10-CM | POA: Diagnosis not present

## 2023-12-15 ENCOUNTER — Other Ambulatory Visit: Payer: Self-pay | Admitting: Internal Medicine

## 2023-12-15 DIAGNOSIS — K760 Fatty (change of) liver, not elsewhere classified: Secondary | ICD-10-CM

## 2023-12-15 DIAGNOSIS — R9389 Abnormal findings on diagnostic imaging of other specified body structures: Secondary | ICD-10-CM

## 2023-12-16 ENCOUNTER — Encounter: Payer: Self-pay | Admitting: Internal Medicine

## 2023-12-17 ENCOUNTER — Encounter: Payer: Self-pay | Admitting: Internal Medicine

## 2023-12-17 NOTE — Progress Notes (Signed)
 Cardiology Office Note:  .   Date:  12/17/2023  ID:  Sheryl Bates, DOB 02/26/1955, MRN [id number] PCP: Bates, Sheryl  E, PA  [facility] Providers Cardiologist:  Alm Clay, MD Electrophysiologist:  Fonda Kitty, MD {  History of Present Illness: .   Sheryl Bates is a 68 y.o. female w/PMHx of  HTN (w/hx of hypertensive urgency), HLD SVT  She was referred to EP fo management recommendations for her SVT She saw Dr. Kitty 10/14/23 Palpitations reported to go back some time though increasingly bothersome and frequent In review of her monitor (see his note), suspected Atach Planned for echo and stress test > if normal > flecainide 50mg  BID reducing her metoprolol  to 25mg  daily. (+/- EP/ablation if AAD failed) Her amlodipine  increased for high home BPs  Stress was negative/low risk, echo noted preserved LVEF   I saw her 11/25/23 She reports + palpitations, fast rates intermittently Also mentions DOE with minimal exertion, walking from the car to the house, this is not new She has bad knees so unable to exercise/be very active. This has crept up on her over time No formal exercise She has a bad L shoulder and with increase work with her arm, gets an aching pain with twinges towards her chest that she attributes to her shoulder > No CP otherwise No near syncope or syncope Palpitations occur 1-2x/week Seconds to a few minutes Started on flecainide, metoprolol  tart > succinate  12/09/23: + EKG visit, no changes rec via DOD   Today's visit is scheduled to f/u on test reuslt ROS:   She is doing well Has had clear improvement in burden of her SVT, mush less frequent and shorter as well She is happy with her management.  She is busy, no formal exercise, but on the go, active at church, cares for her home No CP, SOB No near syncope or syncope   Arrhythmia/AAD hx SVT  Studies Reviewed: SABRA    EKG done today and reviewed by myself:  SB 52bpm, PR , QRS  94ms, QTc , LAD  12/09/23: SB 54, PR , QRS 96ms, QTc 445s  11/25/23: (pre-flecainide) SB 56bpm, LAD PR , QRS 88ms, QTc   11/20/23: stress myoview   The study is normal. The study is low risk.   No ST deviation was noted.   LV perfusion is normal. There is no evidence of ischemia. There is no evidence of infarction.   Left ventricular function is normal. End diastolic cavity size is normal. End systolic cavity size is normal.   CT images were obtained for attenuation correction and were examined for the presence of coronary calcium when appropriate.   Coronary calcium was present on the attenuation correction CT images. Moderate coronary calcifications were present. Coronary calcifications were present in the left anterior descending artery, left circumflex artery and right coronary artery distribution(s).    11/20/23: TTE 1. Left ventricular ejection fraction, by estimation, is 60 to 65%. The  left ventricle has normal function. The left ventricle has no regional  wall motion abnormalities. There is mild left ventricular hypertrophy.  Left ventricular diastolic parameters  were normal.   2. Right ventricular systolic function is normal. The right ventricular  size is normal.   3. The mitral valve is normal in structure. No evidence of mitral valve  regurgitation. No evidence of mitral stenosis.   4. The aortic valve was not well visualized. Aortic valve regurgitation  is not visualized. No aortic stenosis is present.  5. The inferior vena cava is normal in size with greater than 50%  respiratory variability, suggesting right atrial pressure of 3 mmHg.     CPX 08/09/2021: Normal functional capacity with no evidence of significant cardiopulmonary limitation to exercise. Patient's body habitus/obesity likely playing major role in exertional dyspnea.There was a marked hypertensive response to exercise   TTE 07/27/2021:  1. Left ventricular ejection fraction, by  estimation, is 65 to 70%. The left ventricle has normal function. The left ventricle has no regional wall motion abnormalities. There is moderate concentric left ventricular hypertrophy. Left ventricular diastolic parameters are indeterminate.   2. Right ventricular systolic function is normal. The right ventricular size is normal. There is normal pulmonary artery systolic pressure. Normal LA & RA w/ normal RAP.  3. The mitral valve is normal in structure. Trivial mitral valve regurgitation. No evidence of mitral stenosis.  4. The aortic valve is tricuspid. Aortic valve regurgitation is not visualized. No aortic stenosis is present. 5. The inferior vena cava is normal in size with greater than 50% respiratory variability, suggesting right atrial pressure of 3 mmHg   Risk Assessment/Calculations:    Physical Exam:   VS:  There were no vitals taken for this visit.   Wt Readings from Last 3 Encounters:  12/09/23 189 lb (85.7 kg)  11/25/23 188 lb (85.3 kg)  10/14/23 186 lb 6.4 oz (84.6 kg)    GEN: Well nourished, well developed in no acute distress NECK: No JVD; No carotid bruits CARDIAC:  RRR, no murmurs, rubs, gallops RESPIRATORY: CTA b/l without rales, wheezing or rhonchi  ABDOMEN: Soft, non-tender, non-distended EXTREMITIES: No edema; No deformity   ASSESSMENT AND PLAN: .    SVT Suspected ATach On flecainide w/metoprolol  stable intervals Discussed surveillance of flecainide Will plan for EST   HTN Looks ok   Dispo: back in 3-4 mo, sooner if needed, sooner if needed  Signed, Charlies Macario Arthur, PA-C

## 2023-12-23 ENCOUNTER — Ambulatory Visit: Attending: Physician Assistant | Admitting: Physician Assistant

## 2023-12-23 VITALS — BP 124/73 | HR 52 | Ht 63.0 in | Wt 190.0 lb

## 2023-12-23 DIAGNOSIS — Z5181 Encounter for therapeutic drug level monitoring: Secondary | ICD-10-CM | POA: Diagnosis not present

## 2023-12-23 DIAGNOSIS — Z79899 Other long term (current) drug therapy: Secondary | ICD-10-CM | POA: Diagnosis not present

## 2023-12-23 DIAGNOSIS — I10 Essential (primary) hypertension: Secondary | ICD-10-CM | POA: Diagnosis not present

## 2023-12-23 DIAGNOSIS — I471 Supraventricular tachycardia, unspecified: Secondary | ICD-10-CM

## 2023-12-23 NOTE — Patient Instructions (Addendum)
 Medication Instructions:   Your physician recommends that you continue on your current medications as directed. Please refer to the Current Medication list given to you today.    *If you need a refill on your cardiac medications before your next appointment, please call your pharmacy*  Lab Work: NONE ORDERED  TODAY   If you have labs (blood work) drawn today and your tests are completely normal, you will receive your results only by: MyChart Message (if you have MyChart) OR A paper copy in the mail If you have any lab test that is abnormal or we need to change your treatment, we will call you to review the results.  Testing/Procedures: Your physician has requested that you have an exercise tolerance test. For further information please visit [email or web address] Please also follow instruction sheet, as given.    Follow-Up: At [facility], you and your health needs are our priority.  As part of our continuing mission to provide you with exceptional heart care, our providers are all part of one team.  This team includes your primary Cardiologist (physician) and Advanced Practice Providers or APPs (Physician Assistants and Nurse Practitioners) who all work together to provide you with the care you need, when you need it.  Your next appointment:  3 -4 month(s)  Provider:   You may see Fonda Kitty, MD  or   Charlies Arthur, PA-C  We recommend signing up for the patient portal called MyChart.  Sign up information is provided on this After Visit Summary.  MyChart is used to connect with patients for Virtual Visits (Telemedicine).  Patients are able to view lab/test results, encounter notes, upcoming appointments, etc.  Non-urgent messages can be sent to your provider as well.   To learn more about what you can do with MyChart, go to [email or web address].   Other Instructions

## 2023-12-31 ENCOUNTER — Ambulatory Visit
Admission: RE | Admit: 2023-12-31 | Discharge: 2023-12-31 | Disposition: A | Source: Ambulatory Visit | Attending: Internal Medicine | Admitting: Internal Medicine

## 2023-12-31 DIAGNOSIS — R9389 Abnormal findings on diagnostic imaging of other specified body structures: Secondary | ICD-10-CM

## 2023-12-31 DIAGNOSIS — K760 Fatty (change of) liver, not elsewhere classified: Secondary | ICD-10-CM | POA: Diagnosis not present

## 2023-12-31 DIAGNOSIS — K5730 Diverticulosis of large intestine without perforation or abscess without bleeding: Secondary | ICD-10-CM | POA: Diagnosis not present

## 2023-12-31 MED ORDER — GADOPICLENOL 0.5 MMOL/ML IV SOLN
9.0000 mL | Freq: Once | INTRAVENOUS | Status: AC | PRN
  Administered 2023-12-31: 9 mL via INTRAVENOUS

## 2024-01-11 DIAGNOSIS — F322 Major depressive disorder, single episode, severe without psychotic features: Secondary | ICD-10-CM | POA: Diagnosis not present

## 2024-01-11 DIAGNOSIS — I10 Essential (primary) hypertension: Secondary | ICD-10-CM | POA: Diagnosis not present

## 2024-01-11 DIAGNOSIS — E669 Obesity, unspecified: Secondary | ICD-10-CM | POA: Diagnosis not present

## 2024-01-11 DIAGNOSIS — E785 Hyperlipidemia, unspecified: Secondary | ICD-10-CM | POA: Diagnosis not present

## 2024-01-15 ENCOUNTER — Telehealth (HOSPITAL_COMMUNITY): Payer: Self-pay | Admitting: *Deleted

## 2024-01-15 NOTE — Telephone Encounter (Signed)
 Left detailed instructions for ETT on vm.

## 2024-01-22 ENCOUNTER — Ambulatory Visit (HOSPITAL_COMMUNITY)
Admission: RE | Admit: 2024-01-22 | Discharge: 2024-01-22 | Disposition: A | Source: Ambulatory Visit | Attending: Internal Medicine | Admitting: Internal Medicine

## 2024-01-22 DIAGNOSIS — Z79899 Other long term (current) drug therapy: Secondary | ICD-10-CM | POA: Diagnosis not present

## 2024-01-22 LAB — EXERCISE TOLERANCE TEST
Angina Index: 0
Base ST Depression (mm): 0 mm
Duke Treadmill Score: 3
Estimated workload: 5.1
Exercise duration (min): 3 min
Exercise duration (sec): 29 s
MPHR: 151 {beats}/min
Peak HR: 91 {beats}/min
Percent HR: 60 %
Rest HR: 56 {beats}/min
ST Depression (mm): 0 mm

## 2024-01-23 ENCOUNTER — Ambulatory Visit: Payer: Self-pay | Admitting: Physician Assistant

## 2024-01-27 ENCOUNTER — Other Ambulatory Visit: Payer: Self-pay | Admitting: Internal Medicine

## 2024-01-27 DIAGNOSIS — Z1231 Encounter for screening mammogram for malignant neoplasm of breast: Secondary | ICD-10-CM

## 2024-02-02 NOTE — Progress Notes (Addendum)
 Ladan Vanderzanden                                          MRN: [id number]   02/24/2024   The VBCI Quality Team Specialist reviewed this patient medical record for the purposes of chart review for care gap closure. The following were reviewed: abstraction for care gap closure-controlling blood pressure.    VBCI Quality Team

## 2024-02-03 ENCOUNTER — Ambulatory Visit
Admission: RE | Admit: 2024-02-03 | Discharge: 2024-02-03 | Disposition: A | Discharge status: Home / Self Care | Source: Ambulatory Visit | Attending: Internal Medicine | Admitting: Internal Medicine

## 2024-02-03 DIAGNOSIS — Z1231 Encounter for screening mammogram for malignant neoplasm of breast: Secondary | ICD-10-CM

## 2024-02-13 ENCOUNTER — Other Ambulatory Visit: Payer: Self-pay | Admitting: Internal Medicine

## 2024-02-13 DIAGNOSIS — R928 Other abnormal and inconclusive findings on diagnostic imaging of breast: Secondary | ICD-10-CM

## 2024-02-19 ENCOUNTER — Other Ambulatory Visit: Payer: Self-pay | Admitting: Internal Medicine

## 2024-02-19 ENCOUNTER — Ambulatory Visit
Admission: RE | Admit: 2024-02-19 | Discharge: 2024-02-19 | Disposition: A | Discharge status: Home / Self Care | Source: Ambulatory Visit | Attending: Internal Medicine | Admitting: Internal Medicine

## 2024-02-19 DIAGNOSIS — N6310 Unspecified lump in the right breast, unspecified quadrant: Secondary | ICD-10-CM

## 2024-02-19 DIAGNOSIS — R921 Mammographic calcification found on diagnostic imaging of breast: Secondary | ICD-10-CM

## 2024-02-19 DIAGNOSIS — R928 Other abnormal and inconclusive findings on diagnostic imaging of breast: Secondary | ICD-10-CM

## 2024-02-19 DIAGNOSIS — N6489 Other specified disorders of breast: Secondary | ICD-10-CM

## 2024-02-26 ENCOUNTER — Inpatient Hospital Stay: Admission: RE | Admit: 2024-02-26 | Discharge: 2024-02-26 | Attending: Internal Medicine | Admitting: Internal Medicine

## 2024-02-26 ENCOUNTER — Ambulatory Visit
Admission: RE | Admit: 2024-02-26 | Discharge: 2024-02-26 | Disposition: A | Source: Ambulatory Visit | Attending: Internal Medicine | Admitting: Internal Medicine

## 2024-02-26 DIAGNOSIS — N6310 Unspecified lump in the right breast, unspecified quadrant: Secondary | ICD-10-CM

## 2024-02-26 DIAGNOSIS — N6489 Other specified disorders of breast: Secondary | ICD-10-CM

## 2024-02-26 DIAGNOSIS — R921 Mammographic calcification found on diagnostic imaging of breast: Secondary | ICD-10-CM

## 2024-02-26 HISTORY — PX: BREAST BIOPSY: SHX20

## 2024-02-27 LAB — SURGICAL PATHOLOGY

## 2024-03-04 ENCOUNTER — Telehealth: Payer: Self-pay | Admitting: Radiation Oncology

## 2024-03-04 NOTE — Telephone Encounter (Signed)
LVM to schedule consult with Dr. Squire ?

## 2024-03-05 NOTE — Therapy (Signed)
 " OUTPATIENT PHYSICAL THERAPY BREAST CANCER BASELINE EVALUATION   Patient Name: Sheryl Bates MRN: [id number] DOB:30-Sep-1955, 69 y.o., female Today's Date: 03/09/2024  END OF SESSION:  PT End of Session - 03/09/24 1501     Visit Number 1    Number of Visits 9    Date for Recertification  04/27/24    PT Start Time 1407    PT Stop Time 1500    PT Time Calculation (min) 53 min    Activity Tolerance Patient tolerated treatment well    Behavior During Therapy Sheryl Bates for tasks assessed/performed          Past Medical History:  Diagnosis Date   Allergy    CIN I (cervical intraepithelial neoplasia I)    Endometriosis    Hyperlipidemia    Hypertension    Kidney stone    Ovarian cyst    right   Vitamin D  deficiency    Past Surgical History:  Procedure Laterality Date   BREAST BIOPSY Right 02/26/2024   US  RT BREAST BX W LOC DEV 1ST LESION IMG BX SPEC US  GUIDE 02/26/2024 [facility]   BREAST BIOPSY Right 02/26/2024   MM RT BREAST BX W LOC DEV 1ST LESION IMAGE BX SPEC STEREO GUIDE 02/26/2024 [facility]   CARDIOPULMONARY EXERCISE TEST (CPX)  08/09/2021   Normal functional capacity without evidence of significant cardiopulmonary limitation due to exercise.  Marked hypertensive response to exercise.  Body habitus/obesity likely playing a role in exertional dyspnea.   CERVICAL BIOPSY  W/ LOOP ELECTRODE EXCISION     COLPOSCOPY     CYSTOSCOPY/RETROGRADE/URETEROSCOPY/STONE EXTRACTION WITH BASKET Left 12/20/2017   Procedure: CYSTOSCOPY/RETROGRADE/URETEROSCOPY/ LEFT URETERAL STENT PLACEMENT;  Surgeon: Sheryl Belvie CROME, MD;  Location: [facility];  Service: Urology;  Laterality: Left;   ESOPHAGOGASTRODUODENOSCOPY N/A 03/18/2016   Procedure: ESOPHAGOGASTRODUODENOSCOPY (EGD);  Surgeon: Sheryl Bunk, MD;  Location: [facility];  Service: Endoscopy;  Laterality: N/A;   IR GENERIC HISTORICAL  03/15/2016   IR EPIDUROGRAPHY 03/15/2016 [facility]   JOINT REPLACEMENT     KNEE SURGERY      Right   LAPAROSCOPY  02/11/1994   LEEP  02/11/1990   PELVIC LAPAROSCOPY     TRANSTHORACIC ECHOCARDIOGRAM  07/27/2021   Hyperdynamic EF 65 to 70%.  No RWMA.  Moderate concentric LVH (indeterminate diastolic parameters).  Normal RV size function and normal PASP/RAP.  Normal atria size.  Trivial MR.  Normal AoV   Patient Active Problem List   Diagnosis Date Noted   DOE (dyspnea on exertion) 07/16/2021   Sinus tachycardia by electrocardiogram 07/16/2021   Generalized weakness 07/13/2021   Frequent falls 07/13/2021   Acute cystitis 07/13/2021   AKI (acute kidney injury) 07/13/2021   Depression 07/13/2021   Resistant hypertension 12/22/2017   Nausea 12/22/2017   Constipation 12/22/2017   Ureteral stone 12/20/2017   Renal calculus 12/20/2017   Hypokalemia 03/13/2016   Dehydration 03/13/2016   Nausea & vomiting 03/13/2016   Back pain 03/13/2016   Kidney stone    Hypertension    Endometriosis    CIN I (cervical intraepithelial neoplasia I)    Ovarian cyst    Vitamin D  deficiency    Hyperlipidemia     PCP: Sheryl  Cleotilde, PA  REFERRING PROVIDER: Ebbie Cough, MD  REFERRING DIAG: C50.511 (ICD-10-CM) - Malignant neoplasm of lower-outer quadrant of right female breast Z17.0 (ICD-10-CM) - Estrogen receptor positive status (ER+)   THERAPY DIAG:  Abnormal posture  Stiffness of left shoulder, not elsewhere classified  Acute pain  of left shoulder  Malignant neoplasm of lower-outer quadrant of right breast of female, estrogen receptor positive (HCC)  Rationale for Evaluation and Treatment: Rehabilitation  ONSET DATE: 02/26/24  SUBJECTIVE:                                                                                                                                                                                           SUBJECTIVE STATEMENT: Patient reports she is here today to be seen by her medical team for her newly diagnosed right breast cancer. I can not use my  L arm. I have not had time to check it out. I do not know if it is an impingement or a torn rotator cuff. My R arm is fine. My L arm started hurting a couple of months ago. I had my R knee replacement and I need to get my left knee replaced.   PERTINENT HISTORY:  Patient was diagnosed on 02/26/24 with right grade 2. It measures 10 mm and is located in the lower-outer quadrant. It is ER/PR+, HER2- with a Ki67 of 10%.   PATIENT GOALS:   reduce lymphedema risk and learn post op HEP.   PAIN:  Are you having pain? No - dull ache in L shoulder, gets worse with movement  PRECAUTIONS: Active CA, R TKA  RED FLAGS: None   HAND DOMINANCE: right  WEIGHT BEARING RESTRICTIONS: No  FALLS:  Has patient fallen in last 6 months? No  LIVING ENVIRONMENT: Patient lives with: husband, Sheryl Bates Lives in: House/apartment Has following equipment at home: Grab bars  OCCUPATION: retired  LEISURE: exercises 1x/wk, walking  PRIOR LEVEL OF FUNCTION: Independent   OBJECTIVE: Note: Objective measures were completed at Evaluation unless otherwise noted.  COGNITION: Overall cognitive status: Within functional limits for tasks assessed    POSTURE:  Forward head and rounded shoulders posture  UPPER EXTREMITY AROM/PROM:  A/PROM RIGHT   eval   Shoulder extension 65  Shoulder flexion 162  Shoulder abduction 165  Shoulder internal rotation 77  Shoulder external rotation 75    (Blank rows = not tested)  A/PROM LEFT   eval  Shoulder extension 42  Shoulder flexion 112  Shoulder abduction 69  Shoulder internal rotation unable  Shoulder external rotation unable    (Blank rows = not tested)  CERVICAL AROM: All within normal limits:    Percent limited  Flexion WFL  Extension WFL  Right lateral flexion WFL  Left lateral flexion WFL  Right rotation WFL  Left rotation WFL   SPECIAL TEST: O'Brien's test: positive on L Empty can: weak positive Drop arm: negative ER lag test:  negative  UPPER EXTREMITY STRENGTH: 5/5 on R, not tested on L due to pain  LYMPHEDEMA ASSESSMENTS (in cm):   LANDMARK RIGHT   eval  10 cm proximal to olecranon process from proximal aspect of olecranon 35  Olecranon process 29  10 cm proximal to ulnar styloid process from proximal aspect of styloid process 23  Just distal to ulnar styloid process 17  Across hand at thumb web space 18.2  At base of 2nd digit 5.9  (Blank rows = not tested)  LANDMARK LEFT   eval  10 cm proximal to olecranon process from proximal aspect of olecranon 35.9   Olecranon process 28.5  10 cm proximal to ulnar styloid process from proximal aspect of styloid process 23.4  Just distal to ulnar styloid process 16.8  Across hand at thumb web space 17.5  At base of 2nd digit 5.5  (Blank rows = not tested)  L-DEX LYMPHEDEMA SCREENING:  The patient was assessed using the L-Dex machine today to produce a lymphedema index baseline score. The patient will be reassessed on a regular basis (typically every 3 months) to obtain new L-Dex scores. If the score is > 6.5 points away from his/her baseline score indicating onset of subclinical lymphedema, it will be recommended to wear a compression garment for 4 weeks, 12 hours per day and then be reassessed. If the score continues to be > 6.5 points from baseline at reassessment, we will initiate lymphedema treatment. Assessing in this manner has a 95% rate of preventing clinically significant lymphedema.   L-DEX FLOWSHEETS - 03/09/24 1500       L-DEX LYMPHEDEMA SCREENING   Measurement Type Unilateral    L-DEX MEASUREMENT EXTREMITY Upper Extremity    POSITION  Standing    DOMINANT SIDE Right    At Risk Side Right    BASELINE SCORE (UNILATERAL) 4.9          QUICK DASH SURVEY:  Junie Palin - 03/09/24 0001     Open a tight or new jar Moderate difficulty    Do heavy household chores (wash walls, wash floors) Mild difficulty    Carry a shopping bag or briefcase No  difficulty    Wash your back Severe difficulty    Use a knife to cut food No difficulty    Recreational activities in which you take some force or impact through your arm, shoulder, or hand (golf, hammering, tennis) Moderate difficulty    During the past week, to what extent has your arm, shoulder or hand problem interfered with your normal social activities with family, friends, neighbors, or groups? Not at all    During the past week, to what extent has your arm, shoulder or hand problem limited your work or other regular daily activities Not at all    Arm, shoulder, or hand pain. None    Tingling (pins and needles) in your arm, shoulder, or hand None    Difficulty Sleeping Mild difficulty    DASH Score 20.45 %           PATIENT EDUCATION:  Education details: Time spent educating patient on aspects of self-care to maximize post op recovery. Patient was educated on where and how to get a post op compression bra to use to reduce post op edema. Patient was also educated on the use of SOZO screenings and surveillance principles for early identification of lymphedema onset. She was instructed to use the post op pillow in the axilla for pressure and pain relief. Patient educated  on lymphedema risk reduction and post op shoulder/posture HEP. Person educated: Patient Education method: Explanation, Demonstration, Handout Education comprehension: Patient verbalized understanding and returned demonstration  HOME EXERCISE PROGRAM: Patient was instructed today in a home exercise program today for post op shoulder range of motion. These included active assist shoulder flexion in sitting, scapular retraction, wall walking with shoulder abduction, and hands behind head external rotation.  She was encouraged to do these twice a day, holding 3 seconds and repeating 5 times when permitted by her physician.   ASSESSMENT:  CLINICAL IMPRESSION: Pt reports to PT with recently diagnosed R breast cancer. She  will undergo a R lumpectomy and SLNB on 03/23/24. At baseline her L shoulder ROM is extremely limited. Her R shoulder is WFL. Her L shoulder has been this way for a few months. She crochets a lot but has not had a specific injury. O'Brien's test was positive on exam today. There is clicking and popping and deep pain in her L shoulder. She will benefit from PT after her surgery to address L shoulder decreased ROM and pain. She will benefit from a post op PT reassessment to determine needs and from L-Dex screens every 3 months for 2 years to detect subclinical lymphedema.  Pt will benefit from skilled therapeutic intervention to improve on the following deficits: Decreased knowledge of precautions, impaired UE functional use, pain, decreased ROM, postural dysfunction.   PT treatment/interventions: ADL/self-care home management, pt/family education, therapeutic exercise  REHAB POTENTIAL: Good  CLINICAL DECISION MAKING: Stable/uncomplicated  EVALUATION COMPLEXITY: Low   GOALS: Goals reviewed with patient? YES  LONG TERM GOALS: (STG=LTG)    Name Target Date Goal status  1 Pt will be able to verbalize understanding of pertinent lymphedema risk reduction practices relevant to her dx specifically related to skin care.  Baseline:  No knowledge 03/09/2024 Achieved at eval  2 Pt will be able to return demo and/or verbalize understanding of the post op HEP related to regaining shoulder ROM. Baseline:  No knowledge 03/09/2024 Achieved at eval  3 Pt will be able to verbalize understanding of the importance of viewing the post op After Breast CA Class video for further lymphedema risk reduction education and therapeutic exercise.  Baseline:  No knowledge 03/09/2024 Achieved at eval  4 Pt will demo she has regained full shoulder ROM and function post operatively compared to baselines.  Baseline: See objective measurements taken today. 04/27/24 NEW  5 Pt will demonstrate 150 degrees of L shoulder flexion to  allow her to reach overhead. 04/27/24  NEW  6 Pt will demonstrate 150 degrees of L shoulder abduction to allow pt to reach out to the side. 04/27/24  NEW  7 Pt will report a 50% improvement in pain when using L shoulder to allow improved comfort and function. 04/27/24 NEW  8 Pt will be independent in a home exercise program for continued stretching and strengthening.  04/27/24  NEW    PLAN:  PT FREQUENCY/DURATION: EVAL and 1 follow up appointment followed by 2x/wk for 4 weeks   PLAN FOR NEXT SESSION: will reassess 3-4 weeks post op to determine needs begin L shoulder PROM and scapular strengthening    Patient will follow up at outpatient cancer rehab 3-4 weeks following surgery.  If the patient requires physical therapy at that time, a specific plan will be dictated and sent to the referring physician for approval. The patient was educated today on appropriate basic range of motion exercises to begin post operatively and the importance  of viewing the After Breast Cancer class video following surgery.  Patient was educated today on lymphedema risk reduction practices as it pertains to recommendations that will benefit the patient immediately following surgery.  She verbalized good understanding.    Physical Therapy Information for After Breast Cancer Surgery/Treatment:  Lymphedema is a swelling condition that you may be at risk for in your arm if you have lymph nodes removed from the armpit area.  After a sentinel node biopsy, the risk is approximately 5-9% and is higher after an axillary node dissection.  There is treatment available for this condition and it is not life-threatening.  Contact your physician or physical therapist with concerns. You may begin the 4 shoulder/posture exercises (see additional sheet) when permitted by your physician (typically a week after surgery).  If you have drains, you may need to wait until those are removed before beginning range of motion exercises.  A general  recommendation is to not lift your arms above shoulder height until drains are removed.  These exercises should be done to your tolerance and gently.  This is not a no pain/no gain type of recovery so listen to your body and stretch into the range of motion that you can tolerate, stopping if you have pain.  If you are having immediate reconstruction, ask your plastic surgeon about doing exercises as he or she may want you to wait. We encourage you to view the After Breast Cancer class video following surgery.  You will learn information related to lymphedema risk, prevention and treatment and additional exercises to regain mobility following surgery.   While undergoing any medical procedure or treatment, try to avoid blood pressure being taken or needle sticks from occurring on the arm on the side of cancer.   This recommendation begins after surgery and continues for the rest of your life.  This may help reduce your risk of getting lymphedema (swelling in your arm). An excellent resource for those seeking information on lymphedema is the National Lymphedema Network's web site. It can be accessed at www.lymphnet.org If you notice swelling in your hand, arm or breast at any time following surgery (even if it is many years from now), please contact your doctor or physical therapist to discuss this.  Lymphedema can be treated at any time but it is easier for you if it is treated early on.  If you feel like your shoulder motion is not returning to normal in a reasonable amount of time, please contact your surgeon or physical therapist.  [facility] [phone]. [address], [address], [place] [address]  [facility] CLASS After Breast Cancer Class  After Breast Cancer Class is a specially designed exercise class video to assist you in a safe recover after having breast cancer surgery.  In this video you will learn how to get back to full function whether your drains were just  removed or if you had surgery a month ago. The video can be viewed on this page: [email or web address]-meyer.org/ or on YouTube here: [email or web address].  Class Goals  Understand specific stretches to improve the flexibility of you chest and shoulder. Learn ways to safely strengthen your upper body and improve your posture. Understand the warning signs of infection and why you may be at risk for an arm infection. Learn about Lymphedema and prevention.  ** You do not need to view this video until after surgery.  Drains should be removed to participate in the recommended exercises on the video.  Patient was instructed today in a home exercise program today for post op shoulder range of motion. These included active assist shoulder flexion in sitting, scapular retraction, wall walking with shoulder abduction, and hands behind head external rotation.  She was encouraged to do these twice a day, holding 3 seconds and repeating 5 times when permitted by her physician.    Florina Sever Olimpo, PT 03/09/2024, 4:39 PM   "

## 2024-03-09 ENCOUNTER — Other Ambulatory Visit: Payer: Self-pay

## 2024-03-09 ENCOUNTER — Ambulatory Visit: Attending: General Surgery | Admitting: Physical Therapy

## 2024-03-09 ENCOUNTER — Encounter: Payer: Self-pay | Admitting: Physical Therapy

## 2024-03-09 ENCOUNTER — Other Ambulatory Visit: Payer: Self-pay | Admitting: General Surgery

## 2024-03-09 DIAGNOSIS — Z170 Estrogen receptor positive status [ER+]: Secondary | ICD-10-CM | POA: Insufficient documentation

## 2024-03-09 DIAGNOSIS — R293 Abnormal posture: Secondary | ICD-10-CM | POA: Insufficient documentation

## 2024-03-09 DIAGNOSIS — C50511 Malignant neoplasm of lower-outer quadrant of right female breast: Secondary | ICD-10-CM | POA: Diagnosis present

## 2024-03-09 DIAGNOSIS — M25612 Stiffness of left shoulder, not elsewhere classified: Secondary | ICD-10-CM | POA: Diagnosis present

## 2024-03-09 DIAGNOSIS — M25512 Pain in left shoulder: Secondary | ICD-10-CM | POA: Diagnosis present

## 2024-03-09 NOTE — Progress Notes (Signed)
 Location of Breast Cancer: Malignant Neoplasm of Lower-outer Quadrant of Right Breast, Estrogen Receptor Positive  Histology per Pathology Report:    Receptor Status: ER(70%), PR (99%), Her2-neu (Negative), Ki-67(10%)  Did patient present with symptoms (if so, please note symptoms) or was this found on screening mammography?:  02/19/2024 Mammogram   Past/Anticipated interventions by surgeon, if any: February 10th   Past/Anticipated interventions by medical oncology, if any:  Lymphedema issues, if any:  None    Pain issues, if any:  None   SAFETY ISSUES: Prior radiation? None Pacemaker/ICD? None Possible current pregnancy?None Is the patient on methotrexate? None  Current Complaints / other details:  None

## 2024-03-11 DIAGNOSIS — C50411 Malignant neoplasm of upper-outer quadrant of right female breast: Secondary | ICD-10-CM | POA: Insufficient documentation

## 2024-03-11 NOTE — Progress Notes (Signed)
 " Radiation Oncology         [phone] ________________________________  Initial Outpatient Consultation  Name: Sheryl Bates MRN: [id number]  Date: 03/12/2024  DOB: [birth year]  RR:[place], [place]  E, [place]  Ebbie Cough, MD   REFERRING PHYSICIAN: Ebbie Cough, MD  DIAGNOSIS:    ICD-10-CM   1. Carcinoma of upper-outer quadrant of right breast in female, estrogen receptor positive (HCC)  C50.411    Z17.0        Cancer Staging  Carcinoma of upper-outer quadrant of right breast in female, estrogen receptor positive (HCC) Staging form: Breast, AJCC 8th Edition - Clinical stage from 03/11/2024: Stage IA (cT1c, cN0, cM0, G2, ER+, PR+, HER2-) - Signed by Izell Domino, MD on 03/11/2024 Stage prefix: Initial diagnosis Histologic grading system: 3 grade system   Stage IA (cT1c, cN0, cM0) Right Breast, UOQ, Invasive Lobular Carcinoma, ER+ / PR+ / Her2-, Grade 2  CHIEF COMPLAINT: Here to discuss management of right breast cancer  HISTORY OF PRESENT ILLNESS::Sheryl Bates is a 69 y.o. female who presented with a right breast abnormality on the following imaging: bilateral screening mammogram on the date of 02/03/24. No symptoms, if any, were reported at that time. A right breast diagnostic mammogram and right breast ultrasound was accordingly performed on 02/19/24 which further revealed: a suspicious 1.2 cm mass in the 10 o'clock region of the right breast, located 8 cmfn, as well as an indeterminate group of calcifications in the upper outer right breast spanning approximately 1.0 cm. Imaging otherwise showed no evidence of right axillary lymphadenopathy.   Biopsy of the 10 o'clock right breast mass on the date of 02/26/24 showed grade 2 invasive lobular carcinoma measuring 1.1 cm in the greatest linear extent of the sample; negative for LVI.  ER status: 70% positive with moderate to strong staining intensity; PR status 99% positive with strong staining intensity; Her2  status negative; Proliferation marker Ki67 at 10%; Grade 2. The upper outer right breast calcifications were also biopsied and showed a focus of ADH measuring < 2 mm, as well as a focus of ALH measuring < 1 (negative for carcinoma). No lymph nodes were examined.   She was accordingly seen in consultation by Dr. Ebbie on 03/03/24 to discuss surgical treatment options. Based on her discussion with Dr. Ebbie, she has opted to proceed with breast conserving surgery w/ right axillary SLN excisions. Her procedure is currently scheduled for 03/23/24.    Lymphedema issues, if any:  None    Pain issues, if any:  None   SAFETY ISSUES: Prior radiation? None Pacemaker/ICD? None Possible current pregnancy?None Is the patient on methotrexate? None  Current Complaints / other details:  None  PREVIOUS RADIATION THERAPY: No  PAST MEDICAL HISTORY:  has a past medical history of Allergy, CIN I (cervical intraepithelial neoplasia I), Endometriosis, Hyperlipidemia, Hypertension, Kidney stone, Ovarian cyst, and Vitamin D  deficiency.    PAST SURGICAL HISTORY: Past Surgical History:  Procedure Laterality Date   BREAST BIOPSY Right 02/26/2024   US  RT BREAST BX W LOC DEV 1ST LESION IMG BX SPEC US  GUIDE 02/26/2024 [facility]   BREAST BIOPSY Right 02/26/2024   MM RT BREAST BX W LOC DEV 1ST LESION IMAGE BX SPEC STEREO GUIDE 02/26/2024 [facility]   CARDIOPULMONARY EXERCISE TEST (CPX)  08/09/2021   Normal functional capacity without evidence of significant cardiopulmonary limitation due to exercise.  Marked hypertensive response to exercise.  Body habitus/obesity likely playing a role in exertional dyspnea.   CERVICAL BIOPSY  W/ LOOP ELECTRODE EXCISION     COLPOSCOPY     CYSTOSCOPY/RETROGRADE/URETEROSCOPY/STONE EXTRACTION WITH BASKET Left 12/20/2017   Procedure: CYSTOSCOPY/RETROGRADE/URETEROSCOPY/ LEFT URETERAL STENT PLACEMENT;  Surgeon: Sherrilee Belvie CROME, MD;  Location: [facility];  Service:  Urology;  Laterality: Left;   ESOPHAGOGASTRODUODENOSCOPY N/A 03/18/2016   Procedure: ESOPHAGOGASTRODUODENOSCOPY (EGD);  Surgeon: Lamar Bunk, MD;  Location: [facility];  Service: Endoscopy;  Laterality: N/A;   IR GENERIC HISTORICAL  03/15/2016   IR EPIDUROGRAPHY 03/15/2016 [facility]   JOINT REPLACEMENT     KNEE SURGERY     Right   LAPAROSCOPY  02/11/1994   LEEP  02/11/1990   PELVIC LAPAROSCOPY     TRANSTHORACIC ECHOCARDIOGRAM  07/27/2021   Hyperdynamic EF 65 to 70%.  No RWMA.  Moderate concentric LVH (indeterminate diastolic parameters).  Normal RV size function and normal PASP/RAP.  Normal atria size.  Trivial MR.  Normal AoV    FAMILY HISTORY: family history includes Diabetes in her mother; Heart disease (age of onset: 37) in her mother; Heart disease (age of onset: 78) in her father; Hypertension in her mother.  SOCIAL HISTORY:  reports that she quit smoking about 18 years ago. Her smoking use included cigarettes. She has never used smokeless tobacco. She reports that she does not drink alcohol and does not use drugs.  ALLERGIES: Sulfonamide derivatives and Lisinopril  MEDICATIONS:  Current Outpatient Medications  Medication Sig Dispense Refill   ALPRAZolam  (XANAX ) 0.5 MG tablet Take 0.5 mg by mouth daily as needed for anxiety.     amLODipine  (NORVASC ) 10 MG tablet Take 1 tablet (10 mg total) by mouth daily. 180 tablet 3   aspirin  EC 81 MG tablet Take 81 mg by mouth daily.     citalopram  (CELEXA ) 20 MG tablet Take 2 tablets (40 mg total) by mouth daily.     flecainide  (TAMBOCOR ) 50 MG tablet Take 1 tablet (50 mg total) by mouth 2 (two) times daily. 180 tablet 3   hydrALAZINE  (APRESOLINE ) 25 MG tablet Take 1 tablet (25 mg total) by mouth 2 (two) times daily.     hydrochlorothiazide  (HYDRODIURIL ) 25 MG tablet Take 25 mg by mouth every morning.     losartan  (COZAAR ) 100 MG tablet Take 1 tablet (100 mg total) by mouth daily. 30 tablet 1   metoprolol  succinate (TOPROL  XL) 25 MG  24 hr tablet Take 1 tablet (25 mg total) by mouth daily. 90 tablet 3   Multiple Vitamins-Minerals (CENTRUM SILVER 50+WOMEN) TABS Take 1 tablet by mouth daily.     vitamin C (ASCORBIC ACID) 500 MG tablet Take 500 mg by mouth daily.     No current facility-administered medications for this encounter.    REVIEW OF SYSTEMS: As above in HPI.   PHYSICAL EXAM:  height is 5' 3 (1.6 m) and weight is 188 lb (85.3 kg). Her temperature is 97.6 F (36.4 C). Her blood pressure is 124/57 (abnormal) and her pulse is 65. Her respiration is 18 and oxygen saturation is 97%.   General: Alert and oriented, in no acute distress HEENT: Head is normocephalic. Extraocular movements are intact.  Heart: distant heart sounds. Regular in rate  Chest: Clear to auscultation bilaterally, with no rhonchi, wheezes, or rales. Abdomen: Soft, nontender, nondistended, with no rigidity or guarding.  Skin: No concerning lesions. Musculoskeletal: symmetric strength and muscle tone throughout. Neurologic: Cranial nerves II through XII are grossly intact. No obvious focalities. Speech is fluent. Coordination is intact. Psychiatric: Judgment and insight are intact. Affect is appropriate. Breasts:  no palpable masses appreciated in the breasts or axillae b/l .  ECOG = 0  0 - Asymptomatic (Fully active, able to carry on all predisease activities without restriction)  1 - Symptomatic but completely ambulatory (Restricted in physically strenuous activity but ambulatory and able to carry out work of a light or sedentary nature. For example, light housework, office work)  2 - Symptomatic, <50% in bed during the day (Ambulatory and capable of all self care but unable to carry out any work activities. Up and about more than 50% of waking hours)  3 - Symptomatic, >50% in bed, but not bedbound (Capable of only limited self-care, confined to bed or chair 50% or more of waking hours)  4 - Bedbound (Completely disabled. Cannot carry on any  self-care. Totally confined to bed or chair)  5 - Death   Raylene MM, Creech RH, Tormey DC, et al. [phone]). Toxicity and response criteria of the [facility]. Am. DOROTHA Bridges. Oncol. 5 (6): 649-55   LABORATORY DATA:   CBC    Component Value Date/Time   WBC 9.4 10/31/2022 0831   RBC 3.27 (L) 10/31/2022 0831   HGB 10.3 (L) 10/31/2022 0831   HCT 31.1 (L) 10/31/2022 0831   PLT 229 10/31/2022 0831   MCV 95.1 10/31/2022 0831   MCH 31.5 10/31/2022 0831   MCHC 33.1 10/31/2022 0831   RDW 13.0 10/31/2022 0831   LYMPHSABS 1.0 10/28/2022 1331   MONOABS 0.7 10/28/2022 1331   EOSABS 0.1 10/28/2022 1331   BASOSABS 0.0 10/28/2022 1331    CMP     Component Value Date/Time   NA 137 10/31/2022 0831   K 3.3 (L) 10/31/2022 0831   CL 99 10/31/2022 0831   CO2 27 10/31/2022 0831   GLUCOSE 122 (H) 10/31/2022 0831   BUN 14 10/31/2022 0831   CREATININE 0.63 10/31/2022 0831   CALCIUM 9.0 10/31/2022 0831   PROT 6.3 (L) 10/31/2022 0831   ALBUMIN  3.9 10/31/2022 0831   AST 26 10/31/2022 0831   ALT 25 10/31/2022 0831   ALKPHOS 64 10/31/2022 0831   BILITOT 1.5 (H) 10/31/2022 0831   GFR 92.42 07/19/2013 0957   GFRNONAA >60 10/31/2022 0831      RADIOGRAPHY: US  RT BREAST BX W LOC DEV 1ST LESION IMG BX SPEC US  GUIDE Addendum Date: 03/02/2024 ADDENDUM REPORT: 03/02/2024 07:48 ADDENDUM: PATHOLOGY revealed: Site 1. Breast, right, needle core biopsy, UOQ, 10 o'clock, 8 cmfn, ribbon clip- INVASIVE MAMMARY CARCINOMA, OVERALL GRADE: II- LYMPHOVASCULAR INVASION: NOT IDENTIFIED- CANCER LENGTH: 11 MM IN GREATEST LINEAR DIMENSION- CALCIFICATIONS: NOT IDENTIFIED. Pathology results are CONCORDANT with imaging findings, per Dr. Debby Satterfield. PATHOLOGY revealed: Site 2. Breast, right, needle core biopsy, UOQ middle depth, X clip- FOCUS OF ATYPICAL DUCTAL HYPERPLASIA (<2 MM) AND FOCUS OF ATYPICAL LOBULAR HYPERPLASIA (<1 MM) IN THE BACKGROUND OF FIBROCYSTIC CHANGE WITH COLUMNAR CELL CHANGE. Pathology  results are CONCORDANT with imaging findings, per Dr. Debby Satterfield. Pathology results and recommendations below were discussed with patient by telephone. Patient reported biopsy site within normal limits with slight tenderness at the site. Post biopsy care instructions were reviewed, questions were answered and my direct phone number was provided to patient. Patient was instructed to call [facility] if any concerns or questions arise related to the biopsy. RECOMMENDATION: Surgical and oncological consultation. Request for surgical consultation relayed to Olam Bunnell and Landry Finger at [facility]. Pathology results reported by Mliss CHARM Molt RN 02/27/2024. Electronically Signed   By:  Debby Satterfield M.D.   On: 03/02/2024 07:48   Result Date: 03/02/2024 CLINICAL DATA:  69 year old with a screening detected suspicious 1.5 cm mass in the UPPER OUTER QUADRANT of the RIGHT breast at 10 o'clock 8 cm from nipple at posterior depth and an indeterminate 1.0 cm group of calcifications in the UPPER OUTER QUADRANT of the RIGHT breast at middle depth located approximately 3 cm anterior to the mass. EXAM: ULTRASOUND GUIDED RIGHT BREAST CORE NEEDLE BIOPSY MAMMOGRAPHICALLY GUIDED STEREOTACTIC RIGHT BREAST CORE NEEDLE BIOPSY COMPARISON:  Previous exam(s). PROCEDURE: I met with the patient and we discussed the procedure of ultrasound-guided biopsy, including benefits and alternatives. We discussed the high likelihood of a successful procedure. We discussed the risks of the procedure, including infection, bleeding, tissue injury, clip migration, and inadequate sampling. Informed written consent was given. The usual time-out protocol was performed immediately prior to the procedure. #1) Ultrasound guided biopsy of the mass, lesion quadrant: UPPER OUTER QUADRANT. Using sterile technique with chlorhexidine  as skin antisepsis, 1% lidocaine  and 1% lidocaine  with epinephrine as local anesthetic,  under direct ultrasound visualization, a 12 gauge Bard Marquee core needle device placed through an 11 gauge introducer needle was used to perform biopsy of the mass in the UPPER OUTER QUADRANT using a lateral approach. At the conclusion of the procedure, a ribbon shaped tissue marker clip was deployed into the biopsy cavity. # 2) Stereotactic biopsy of the calcifications, lesion quadrant: UPPER OUTER QUADRANT. Using sterile technique with chlorhexidine  as skin antisepsis, 1% lidocaine  and 1% lidocaine  with epinephrine as local anesthetic, using stereotactic tomosynthesis guidance, a 9 gauge vacuum assisted device was used to perform biopsy of the calcifications in the UPPER OUTER QUADRANT using a superior approach. Specimen radiograph demonstrated calcifications in blank core samples. Specimens with calcifications are marked for pathology. At the conclusion of the procedure, a blank shaped tissue marker clip was deployed into the biopsy cavity. The patient tolerated the procedures well without apparent immediate complications. Follow up 2 view mammogram was performed in order to confirm clip placement and was dictated separately. IMPRESSION: 1. Ultrasound guided core needle biopsy of a suspicious 1.5 cm mass in the UPPER OUTER QUADRANT of the RIGHT breast at posterior depth. 2. Stereotactic tomosynthesis guided core needle biopsy of a 1.0 cm group of indeterminate calcifications in the UPPER OUTER QUADRANT of the RIGHT breast at middle depth. Electronically Signed: By: Debby Satterfield M.D. On: 02/26/2024 09:19   MM RT BREAST BX W LOC DEV 1ST LESION IMAGE BX SPEC STEREO GUIDE Addendum Date: 03/02/2024 ADDENDUM REPORT: 03/02/2024 07:48 ADDENDUM: PATHOLOGY revealed: Site 1. Breast, right, needle core biopsy, UOQ, 10 o'clock, 8 cmfn, ribbon clip- INVASIVE MAMMARY CARCINOMA, OVERALL GRADE: II- LYMPHOVASCULAR INVASION: NOT IDENTIFIED- CANCER LENGTH: 11 MM IN GREATEST LINEAR DIMENSION- CALCIFICATIONS: NOT IDENTIFIED.  Pathology results are CONCORDANT with imaging findings, per Dr. Debby Satterfield. PATHOLOGY revealed: Site 2. Breast, right, needle core biopsy, UOQ middle depth, X clip- FOCUS OF ATYPICAL DUCTAL HYPERPLASIA (<2 MM) AND FOCUS OF ATYPICAL LOBULAR HYPERPLASIA (<1 MM) IN THE BACKGROUND OF FIBROCYSTIC CHANGE WITH COLUMNAR CELL CHANGE. Pathology results are CONCORDANT with imaging findings, per Dr. Debby Satterfield. Pathology results and recommendations below were discussed with patient by telephone. Patient reported biopsy site within normal limits with slight tenderness at the site. Post biopsy care instructions were reviewed, questions were answered and my direct phone number was provided to patient. Patient was instructed to call [facility] if any concerns or questions arise related to  the biopsy. RECOMMENDATION: Surgical and oncological consultation. Request for surgical consultation relayed to Olam Bunnell and Landry Finger at [facility]. Pathology results reported by Mliss CHARM Molt RN 02/27/2024. Electronically Signed   By: Debby Satterfield M.D.   On: 03/02/2024 07:48   Result Date: 03/02/2024 CLINICAL DATA:  69 year old with a screening detected suspicious 1.5 cm mass in the UPPER OUTER QUADRANT of the RIGHT breast at 10 o'clock 8 cm from nipple at posterior depth and an indeterminate 1.0 cm group of calcifications in the UPPER OUTER QUADRANT of the RIGHT breast at middle depth located approximately 3 cm anterior to the mass. EXAM: ULTRASOUND GUIDED RIGHT BREAST CORE NEEDLE BIOPSY MAMMOGRAPHICALLY GUIDED STEREOTACTIC RIGHT BREAST CORE NEEDLE BIOPSY COMPARISON:  Previous exam(s). PROCEDURE: I met with the patient and we discussed the procedure of ultrasound-guided biopsy, including benefits and alternatives. We discussed the high likelihood of a successful procedure. We discussed the risks of the procedure, including infection, bleeding, tissue injury, clip migration, and  inadequate sampling. Informed written consent was given. The usual time-out protocol was performed immediately prior to the procedure. #1) Ultrasound guided biopsy of the mass, lesion quadrant: UPPER OUTER QUADRANT. Using sterile technique with chlorhexidine  as skin antisepsis, 1% lidocaine  and 1% lidocaine  with epinephrine as local anesthetic, under direct ultrasound visualization, a 12 gauge Bard Marquee core needle device placed through an 11 gauge introducer needle was used to perform biopsy of the mass in the UPPER OUTER QUADRANT using a lateral approach. At the conclusion of the procedure, a ribbon shaped tissue marker clip was deployed into the biopsy cavity. # 2) Stereotactic biopsy of the calcifications, lesion quadrant: UPPER OUTER QUADRANT. Using sterile technique with chlorhexidine  as skin antisepsis, 1% lidocaine  and 1% lidocaine  with epinephrine as local anesthetic, using stereotactic tomosynthesis guidance, a 9 gauge vacuum assisted device was used to perform biopsy of the calcifications in the UPPER OUTER QUADRANT using a superior approach. Specimen radiograph demonstrated calcifications in blank core samples. Specimens with calcifications are marked for pathology. At the conclusion of the procedure, a blank shaped tissue marker clip was deployed into the biopsy cavity. The patient tolerated the procedures well without apparent immediate complications. Follow up 2 view mammogram was performed in order to confirm clip placement and was dictated separately. IMPRESSION: 1. Ultrasound guided core needle biopsy of a suspicious 1.5 cm mass in the UPPER OUTER QUADRANT of the RIGHT breast at posterior depth. 2. Stereotactic tomosynthesis guided core needle biopsy of a 1.0 cm group of indeterminate calcifications in the UPPER OUTER QUADRANT of the RIGHT breast at middle depth. Electronically Signed: By: Debby Satterfield M.D. On: 02/26/2024 09:19   MM CLIP PLACEMENT RIGHT Result Date: 02/26/2024 CLINICAL  DATA:  Confirmation of clip placement after ultrasound-guided core needle biopsy of a suspicious mass involving the UPPER OUTER QUADRANT of the RIGHT breast at posterior depth and stereotactic tomosynthesis core needle biopsy of indeterminate calcifications in the UPPER OUTER QUADRANT of the RIGHT breast at middle depth. EXAM: 2D and 3D DIAGNOSTIC RIGHT MAMMOGRAM POST ULTRASOUND AND STEREOTACTIC BIOPSY COMPARISON:  Previous exam(s). ACR Breast Density Category b: There are scattered areas of fibroglandular density. FINDINGS: 2D and 3D full field CC and mediolateral mammographic images were obtained following ultrasound guided biopsy of a suspicious mass in the UPPER OUTER QUADRANT of the RIGHT breast at posterior depth and a group of indeterminate calcifications in the UPPER OUTER QUADRANT of the RIGHT breast at middle depth. The ribbon shaped tissue marking clip is appropriately positioned  at the anterosuperior margin of the biopsied mass in the UPPER OUTER QUADRANT at posterior depth. The X shaped tissue marking clip is appropriately positioned at the site of the biopsied calcifications in the UPPER OUTER QUADRANT at middle depth. There are residual calcifications at the biopsy site. The clips are separated by approximately 3.5 cm. Expected post biopsy changes are present at both sites without evidence of hematoma. IMPRESSION: 1. Appropriate positioning of the ribbon shaped tissue marking clip at anterosuperior margin of the biopsied mass in the UPPER OUTER QUADRANT of the RIGHT breast at posterior depth. 2. Appropriate positioning of the X shaped tissue marking clip at the site of the biopsied calcifications in the UPPER OUTER QUADRANT of the RIGHT breast at middle depth. 3. The clips are separated by approximately 3.5 cm. Final Assessment: Post Procedure Mammograms for Marker Placement Electronically Signed   By: Debby Satterfield M.D.   On: 02/26/2024 09:19   MM 3D DIAGNOSTIC MAMMOGRAM UNILATERAL RIGHT  BREAST Result Date: 02/19/2024 CLINICAL DATA:  RIGHT distortion callback.  No history of surgery. EXAM: DIGITAL DIAGNOSTIC UNILATERAL RIGHT MAMMOGRAM WITH TOMOSYNTHESIS AND CAD; ULTRASOUND RIGHT BREAST LIMITED TECHNIQUE: Right digital diagnostic mammography and breast tomosynthesis was performed. The images were evaluated with computer-aided detection. ; Targeted ultrasound examination of the right breast was performed COMPARISON:  Previous exam(s). ACR Breast Density Category b: There are scattered areas of fibroglandular density. FINDINGS: Spot compression tomosynthesis views demonstrate a persistent area of architectural distortion in the RIGHT upper outer breast at posterior depth. Spot magnification views were performed of this area which demonstrate a group of pleomorphic calcifications in the upper outer breast at middle depth. These span approximately 10 mm. These are not definitely stable compared to prior. They are located approximately anterior 2.7 cm from the margins of the architectural distortion. On physical exam, there is a firm area in the RIGHT upper outer breast. Targeted ultrasound was performed of the RIGHT upper outer breast. At 10 o'clock 8 cm from nipple, there is an irregular hypoechoic mass with indistinct margins. It is estimated to measure 10 x 12 by 8 mm. Targeted ultrasound of the RIGHT axilla. No suspicious axillary lymph nodes are visualized. IMPRESSION: 1. There is a suspicious 12 mm RIGHT breast mass at the site of screening mammographic concern. Recommend ultrasound-guided biopsy for definitive characterization. 2. There is an indeterminate 10 mm group of calcifications in the upper outer breast at middle depth. Given suspicious mass at 10 o'clock, recommend stereotactic guided biopsy of these calcifications for definitive characterization. 3. No suspicious RIGHT axillary adenopathy. RECOMMENDATION: 1. RIGHT breast ultrasound-guided biopsy x1 2. RIGHT breast stereotactic guided  biopsy x1 I have discussed the findings and recommendations with the patient. The biopsy procedure was discussed with the patient and questions were answered. Patient expressed their understanding of the biopsy recommendation. Patient will be scheduled for biopsy at her earliest convenience by the schedulers. Ordering provider will be notified. If applicable, a reminder letter will be sent to the patient regarding the next appointment. BI-RADS CATEGORY  4: Suspicious. Electronically Signed   By: Corean Salter M.D.   On: 02/19/2024 08:54   US  LIMITED ULTRASOUND INCLUDING AXILLA RIGHT BREAST Result Date: 02/19/2024 CLINICAL DATA:  RIGHT distortion callback.  No history of surgery. EXAM: DIGITAL DIAGNOSTIC UNILATERAL RIGHT MAMMOGRAM WITH TOMOSYNTHESIS AND CAD; ULTRASOUND RIGHT BREAST LIMITED TECHNIQUE: Right digital diagnostic mammography and breast tomosynthesis was performed. The images were evaluated with computer-aided detection. ; Targeted ultrasound examination of the right breast was performed  COMPARISON:  Previous exam(s). ACR Breast Density Category b: There are scattered areas of fibroglandular density. FINDINGS: Spot compression tomosynthesis views demonstrate a persistent area of architectural distortion in the RIGHT upper outer breast at posterior depth. Spot magnification views were performed of this area which demonstrate a group of pleomorphic calcifications in the upper outer breast at middle depth. These span approximately 10 mm. These are not definitely stable compared to prior. They are located approximately anterior 2.7 cm from the margins of the architectural distortion. On physical exam, there is a firm area in the RIGHT upper outer breast. Targeted ultrasound was performed of the RIGHT upper outer breast. At 10 o'clock 8 cm from nipple, there is an irregular hypoechoic mass with indistinct margins. It is estimated to measure 10 x 12 by 8 mm. Targeted ultrasound of the RIGHT axilla. No  suspicious axillary lymph nodes are visualized. IMPRESSION: 1. There is a suspicious 12 mm RIGHT breast mass at the site of screening mammographic concern. Recommend ultrasound-guided biopsy for definitive characterization. 2. There is an indeterminate 10 mm group of calcifications in the upper outer breast at middle depth. Given suspicious mass at 10 o'clock, recommend stereotactic guided biopsy of these calcifications for definitive characterization. 3. No suspicious RIGHT axillary adenopathy. RECOMMENDATION: 1. RIGHT breast ultrasound-guided biopsy x1 2. RIGHT breast stereotactic guided biopsy x1 I have discussed the findings and recommendations with the patient. The biopsy procedure was discussed with the patient and questions were answered. Patient expressed their understanding of the biopsy recommendation. Patient will be scheduled for biopsy at her earliest convenience by the schedulers. Ordering provider will be notified. If applicable, a reminder letter will be sent to the patient regarding the next appointment. BI-RADS CATEGORY  4: Suspicious. Electronically Signed   By: Corean Salter M.D.   On: 02/19/2024 08:54      IMPRESSION/PLAN:  Cancer Staging  Carcinoma of upper-outer quadrant of right breast in female, estrogen receptor positive (HCC) Staging form: Breast, AJCC 8th Edition - Clinical stage from 03/11/2024: Stage IA (cT1c, cN0, cM0, G2, ER+, PR+, HER2-) - Signed by Izell Domino, MD on 03/11/2024 Stage prefix: Initial diagnosis Histologic grading system: 3 grade system    For the patient's early stage favorable risk breast cancer, we had a thorough discussion about her options for adjuvant therapy. One option would be antiestrogen therapy as discussed with medical oncology. She would take a pill for approximately 5 years.  Alternatively, she could pursue adjuvant radiation therapy.  The most aggressive option would be to pursue both modalities adjuvantly.     We discussed the risks  benefits and side effects of optional radiotherapy. She understands that the side effects would likely include some skin irritation and fatigue during the weeks of radiation. There is a risk of late effects which include but are not necessarily limited to cosmetic changes and rare lung toxicity. I would anticipate delivering approximately 1-3 weeks of radiotherapy    After a thorough discussion of standard hypofractionation over 3 weeks, we discussed 1 week of ultra hypofractionated radiation therapy. For elderly patients that are keen on the convenience of minimizing the number of procedures/visits to the cancer center, this is a slightly less standard but reasonable approach to consider, and the vast majority of patients do very well. There is an option of RT Monday through Friday over 1 single week; or, patients can pursue treatment once weekly for 5 weeks. We would need to make sure her disease is not more advanced than anticipated on  her final pathology before pursuing this method.   She is enthusiastic about pursuing radiation and not quite sure about what fractionation she will choose.  We will regroup after her surgery to come up with a final plan.  We spoke about other general acute effects of breast radiation including skin irritation and fatigue as well as breast fibrosis, induration, and /or swelling long term, and much less common late effects including internal organ injury or irritation. We spoke about the latest technology that is used to minimize the risk of late effects for patients undergoing radiotherapy to the breast or chest wall. No guarantees of treatment were given.  Consent form was signed and placed in her chart today.  I look forward to participating in the patient's care.  I will await her referral back to me for postoperative follow-up and eventual CT simulation/treatment planning.       On date of service, in total, I spent 60 minutes on this encounter. Patient was seen in  person.   __________________________________________   Lauraine Golden, MD  This document serves as a record of services personally performed by Lauraine Golden, MD. It was created on her behalf by Dorthy Fuse, a trained medical scribe. The creation of this record is based on the scribe's personal observations and the provider's statements to them. This document has been checked and approved by the attending provider.  "

## 2024-03-12 ENCOUNTER — Ambulatory Visit
Admission: RE | Admit: 2024-03-12 | Discharge: 2024-03-12 | Disposition: A | Source: Ambulatory Visit | Attending: Radiation Oncology

## 2024-03-12 ENCOUNTER — Encounter: Payer: Self-pay | Admitting: Radiation Oncology

## 2024-03-12 ENCOUNTER — Ambulatory Visit
Admission: RE | Admit: 2024-03-12 | Discharge: 2024-03-12 | Disposition: A | Source: Ambulatory Visit | Attending: Radiation Oncology | Admitting: Radiation Oncology

## 2024-03-12 VITALS — BP 124/57 | HR 65 | Temp 97.6°F | Resp 18 | Ht 63.0 in | Wt 188.0 lb

## 2024-03-12 DIAGNOSIS — C50411 Malignant neoplasm of upper-outer quadrant of right female breast: Secondary | ICD-10-CM

## 2024-03-12 DIAGNOSIS — Z170 Estrogen receptor positive status [ER+]: Secondary | ICD-10-CM

## 2024-03-14 ENCOUNTER — Encounter: Payer: Self-pay | Admitting: *Deleted

## 2024-03-15 ENCOUNTER — Encounter (HOSPITAL_BASED_OUTPATIENT_CLINIC_OR_DEPARTMENT_OTHER): Payer: Self-pay | Admitting: General Surgery

## 2024-03-15 ENCOUNTER — Other Ambulatory Visit: Payer: Self-pay

## 2024-03-18 ENCOUNTER — Inpatient Hospital Stay

## 2024-03-18 ENCOUNTER — Encounter (HOSPITAL_BASED_OUTPATIENT_CLINIC_OR_DEPARTMENT_OTHER)
Admission: RE | Admit: 2024-03-18 | Discharge: 2024-03-18 | Disposition: A | Source: Ambulatory Visit | Attending: General Surgery

## 2024-03-18 ENCOUNTER — Encounter: Payer: Self-pay | Admitting: *Deleted

## 2024-03-18 ENCOUNTER — Ambulatory Visit: Admission: RE | Admit: 2024-03-18 | Source: Ambulatory Visit

## 2024-03-18 ENCOUNTER — Inpatient Hospital Stay: Admitting: Hematology and Oncology

## 2024-03-18 VITALS — BP 140/85 | HR 60 | Temp 98.0°F | Resp 18 | Ht 63.0 in | Wt 191.0 lb

## 2024-03-18 DIAGNOSIS — C50411 Malignant neoplasm of upper-outer quadrant of right female breast: Secondary | ICD-10-CM

## 2024-03-18 LAB — BASIC METABOLIC PANEL WITH GFR
Anion gap: 10 (ref 5–15)
BUN: 10 mg/dL (ref 8–23)
CO2: 28 mmol/L (ref 22–32)
Calcium: 9.2 mg/dL (ref 8.9–10.3)
Chloride: 100 mmol/L (ref 98–111)
Creatinine, Ser: 0.81 mg/dL (ref 0.44–1.00)
GFR, Estimated: >60 mL/min
Glucose, Bld: 120 mg/dL — ABNORMAL HIGH (ref 70–99)
Potassium: 4.2 mmol/L (ref 3.5–5.1)
Sodium: 138 mmol/L (ref 135–145)

## 2024-03-18 MED ORDER — CHLORHEXIDINE GLUCONATE CLOTH 2 % EX PADS: 6.0000 | MEDICATED_PAD | Freq: Once | CUTANEOUS | Status: AC

## 2024-03-18 MED ORDER — ENSURE PRE-SURGERY PO LIQD: 296.0000 mL | Freq: Once | ORAL | Status: AC

## 2024-03-18 NOTE — Assessment & Plan Note (Signed)
 02/26/2024: Screening mammogram detected right breast distortion, 1.2 cm right breast mass, indeterminate 10 mm group of calcifications upper outer right breast.  Axilla negative, right breast biopsy 10:00: Grade 2 invasive lobular cancer no LVI, ER 70%, PR 99%, Ki67 10%, HER2 1+ negative, right breast biopsy UOQ middle depth: [facility]  Pathology and radiology counseling:Discussed with the patient, the details of pathology including the type of breast cancer,the clinical staging, the significance of ER, PR and HER-2/neu receptors and the implications for treatment. After reviewing the pathology in detail, we proceeded to discuss the different treatment options between surgery, radiation, chemotherapy, antiestrogen therapies.  Recommendations: 1. Breast conserving surgery followed by 2. Oncotype DX testing to determine if chemotherapy would be of any benefit followed by 3. Adjuvant radiation therapy followed by 4. Adjuvant antiestrogen therapy  Oncotype counseling: I discussed Oncotype DX test. I explained to the patient that this is a 21 gene panel to evaluate patient tumors DNA to calculate recurrence score. This would help determine whether patient has high risk or low risk breast cancer. She understands that if her tumor was found to be high risk, she would benefit from systemic chemotherapy. If low risk, no need of chemotherapy.  Return to clinic after surgery to discuss final pathology report and then determine if Oncotype DX testing will need to be sent.

## 2024-03-18 NOTE — Progress Notes (Signed)
 [facility] CONSULT NOTE  Patient Care Team: Cleotilde, Virginia  E, PA as PCP - General (Internal Medicine) Anner Alm ORN, MD as PCP - Cardiology (Cardiology) Kennyth Chew, MD as PCP - Electrophysiology (Cardiology) Gerome Devere HERO, RN as Oncology Nurse Navigator  CHIEF COMPLAINTS/PURPOSE OF CONSULTATION:  Newly diagnosed breast cancer  HISTORY OF PRESENTING ILLNESS:   History of Present Illness Sheryl Bates is a 69 year old female with newly diagnosed stage IA invasive lobular carcinoma of the right breast who presents for oncology consultation and preoperative evaluation.  She was diagnosed after an abnormal screening mammogram led to ultrasound and two biopsies. The first biopsy showed a 1.2 cm invasive lobular carcinoma. The second showed atypical ductal and lobular hyperplasia, described as benign but high risk.  She has no breast pain, skin changes, or palpable masses and no other physical symptoms related to the cancer.  Preoperative labs showed elevated non-fasting blood glucose without hyperglycemia symptoms. She has not started cancer-directed medications.  She feels overwhelmed by the number of appointments and instructions but became more anxious only on the day of this visit. She has had annual mammograms, a benign breast ultrasound in the 1980s, and no prior breast cancer or breast interventions.     I reviewed her records extensively and collaborated the history with the patient.  SUMMARY OF ONCOLOGIC HISTORY: Oncology History  Carcinoma of upper-outer quadrant of right breast in female, estrogen receptor positive (HCC)  02/26/2024 Initial Diagnosis   Screening mammogram detected right breast distortion, 1.2 cm right breast mass, indeterminate 10 mm group of calcifications upper outer right breast. Axilla negative, right breast biopsy 10:00: Grade 2 invasive lobular cancer no LVI, ER 70%, PR 99%, Ki67 10%, HER2 1+ negative, right  breast biopsy UOQ middle depth: [facility]    03/11/2024 Cancer Staging   Staging form: Breast, AJCC 8th Edition - Clinical stage from 03/11/2024: Stage IA (cT1c, cN0, cM0, G2, ER+, PR+, HER2-) - Signed by Izell Domino, MD on 03/11/2024 Stage prefix: Initial diagnosis Histologic grading system: 3 grade system      MEDICAL HISTORY:  Past Medical History:  Diagnosis Date   Allergy    CIN I (cervical intraepithelial neoplasia I)    Endometriosis    Hyperlipidemia    Hypertension    Kidney stone    Ovarian cyst    right   Vitamin D  deficiency     SURGICAL HISTORY: Past Surgical History:  Procedure Laterality Date   BREAST BIOPSY Right 02/26/2024   US  RT BREAST BX W LOC DEV 1ST LESION IMG BX SPEC US  GUIDE 02/26/2024 [facility]   BREAST BIOPSY Right 02/26/2024   MM RT BREAST BX W LOC DEV 1ST LESION IMAGE BX SPEC STEREO GUIDE 02/26/2024 [facility]   CARDIOPULMONARY EXERCISE TEST (CPX)  08/09/2021   Normal functional capacity without evidence of significant cardiopulmonary limitation due to exercise.  Marked hypertensive response to exercise.  Body habitus/obesity likely playing a role in exertional dyspnea.   CERVICAL BIOPSY  W/ LOOP ELECTRODE EXCISION     COLPOSCOPY     CYSTOSCOPY/RETROGRADE/URETEROSCOPY/STONE EXTRACTION WITH BASKET Left 12/20/2017   Procedure: CYSTOSCOPY/RETROGRADE/URETEROSCOPY/ LEFT URETERAL STENT PLACEMENT;  Surgeon: Sherrilee Belvie CROME, MD;  Location: [facility];  Service: Urology;  Laterality: Left;   ESOPHAGOGASTRODUODENOSCOPY N/A 03/18/2016   Procedure: ESOPHAGOGASTRODUODENOSCOPY (EGD);  Surgeon: Lamar Bunk, MD;  Location: [facility];  Service: Endoscopy;  Laterality: N/A;   IR GENERIC HISTORICAL  03/15/2016   IR EPIDUROGRAPHY  03/15/2016 [facility]   JOINT REPLACEMENT     KNEE SURGERY     Right   LAPAROSCOPY  02/11/1994   LEEP  02/11/1990   PELVIC LAPAROSCOPY     TRANSTHORACIC ECHOCARDIOGRAM  07/27/2021   Hyperdynamic EF 65 to  70%.  No RWMA.  Moderate concentric LVH (indeterminate diastolic parameters).  Normal RV size function and normal PASP/RAP.  Normal atria size.  Trivial MR.  Normal AoV    SOCIAL HISTORY: Social History   Socioeconomic History   Marital status: Married    Spouse name: Not on file   Number of children: 0   Years of education: Not on file   Highest education level: Not on file  Occupational History   Not on file  Tobacco Use   Smoking status: Former    Current packs/day: 0.00    Types: Cigarettes    Quit date: 05/03/2005    Years since quitting: 18.8   Smokeless tobacco: Never  Vaping Use   Vaping status: Never Used  Substance and Sexual Activity   Alcohol use: No    Comment: rare   Drug use: No   Sexual activity: Never    Birth control/protection: Post-menopausal  Other Topics Concern   Not on file  Social History Narrative   She is now raising a step-granddaughter (planning to adopt as of 07/19/2013)      Lots of social stress of late.   Social Drivers of Health   Tobacco Use: Medium Risk (03/15/2024)   Patient History    Smoking Tobacco Use: Former    Smokeless Tobacco Use: Never    Passive Exposure: Not on file  Financial Resource Strain: Not on file  Food Insecurity: No Food Insecurity (03/14/2024)   Epic    Worried About Programme Researcher, Broadcasting/film/video in the Last Year: Never true    Ran Out of Food in the Last Year: Never true  Transportation Needs: No Transportation Needs (03/14/2024)   Epic    Lack of Transportation (Medical): No    Lack of Transportation (Non-Medical): No  Physical Activity: Not on file  Stress: Not on file  Social Connections: Not on file  Intimate Partner Violence: Not At Risk (03/12/2024)   Epic    Fear of Current or Ex-Partner: No    Emotionally Abused: No    Physically Abused: No    Sexually Abused: No  Depression (PHQ2-9): Low Risk (03/12/2024)   Depression (PHQ2-9)    PHQ-2 Score: 0  Alcohol Screen: Not on file  Housing: Low Risk (03/14/2024)    Epic    Unable to Pay for Housing in the Last Year: No    Number of Times Moved in the Last Year: 0    Homeless in the Last Year: No  Utilities: Not At Risk (03/14/2024)   Epic    Threatened with loss of utilities: No  Health Literacy: Not on file    FAMILY HISTORY: Family History  Problem Relation Age of Onset   Hypertension Mother    Heart disease Mother 36       Presumed   Diabetes Mother    Hearing loss Mother    Heart disease Father 98       CHF   Hearing loss Father    Breast cancer Neg Hx     ALLERGIES:  is allergic to sulfonamide derivatives and lisinopril.  MEDICATIONS:  Current Outpatient Medications  Medication Sig Dispense Refill   ALPRAZolam  (XANAX ) 0.5 MG tablet Take 0.5 mg  by mouth daily as needed for anxiety.     amLODipine  (NORVASC ) 10 MG tablet Take 1 tablet (10 mg total) by mouth daily. 180 tablet 3   citalopram  (CELEXA ) 20 MG tablet Take 2 tablets (40 mg total) by mouth daily.     flecainide  (TAMBOCOR ) 50 MG tablet Take 1 tablet (50 mg total) by mouth 2 (two) times daily. 180 tablet 3   hydrALAZINE  (APRESOLINE ) 25 MG tablet Take 1 tablet (25 mg total) by mouth 2 (two) times daily.     hydrochlorothiazide  (HYDRODIURIL ) 25 MG tablet Take 25 mg by mouth every morning.     losartan  (COZAAR ) 100 MG tablet Take 1 tablet (100 mg total) by mouth daily. 30 tablet 1   metoprolol  succinate (TOPROL  XL) 25 MG 24 hr tablet Take 1 tablet (25 mg total) by mouth daily. 90 tablet 3   Multiple Vitamins-Minerals (CENTRUM SILVER 50+WOMEN) TABS Take 1 tablet by mouth daily.     vitamin C (ASCORBIC ACID) 500 MG tablet Take 500 mg by mouth daily.     aspirin  EC 81 MG tablet Take 81 mg by mouth daily. (Patient not taking: Reported on 03/18/2024)     No current facility-administered medications for this visit.   Facility-Administered Medications Ordered in Other Visits  Medication Dose Route Frequency Provider Last Rate Last Admin   Chlorhexidine  Gluconate Cloth 2 % PADS 6 each  6  each Topical Once Ebbie Cough, MD       And   Chlorhexidine  Gluconate Cloth 2 % PADS 6 each  6 each Topical Once Ebbie Cough, MD       NOREEN ON 03/19/2024] feeding supplement (ENSURE PRE-SURGERY) liquid 296 mL  296 mL Oral Once Ebbie Cough, MD        REVIEW OF SYSTEMS:   Constitutional: Denies fevers, chills or abnormal night sweats Breast:  Denies any palpable lumps or discharge All other systems were reviewed with the patient and are negative.  PHYSICAL EXAMINATION: ECOG PERFORMANCE STATUS: 1 - Symptomatic but completely ambulatory  Vitals:   03/18/24 1535  BP: (!) 140/85  Pulse: 60  Resp: 18  Temp: 98 F (36.7 C)  SpO2: 95%   Filed Weights   03/18/24 1535  Weight: 191 lb (86.6 kg)    GENERAL:alert, no distress and comfortable    LABORATORY DATA:  I have reviewed the data as listed Lab Results  Component Value Date   WBC 9.4 10/31/2022   HGB 10.3 (L) 10/31/2022   HCT 31.1 (L) 10/31/2022   MCV 95.1 10/31/2022   PLT 229 10/31/2022   Lab Results  Component Value Date   NA 138 03/18/2024   K 4.2 03/18/2024   CL 100 03/18/2024   CO2 28 03/18/2024    RADIOGRAPHIC STUDIES: I have personally reviewed the radiological reports and agreed with the findings in the report.  ASSESSMENT AND PLAN:  Carcinoma of upper-outer quadrant of right breast in female, estrogen receptor positive (HCC) 02/26/2024: Screening mammogram detected right breast distortion, 1.2 cm right breast mass, indeterminate 10 mm group of calcifications upper outer right breast.  Axilla negative, right breast biopsy 10:00: Grade 2 invasive lobular cancer no LVI, ER 70%, PR 99%, Ki67 10%, HER2 1+ negative, right breast biopsy UOQ middle depth: [facility]  Pathology and radiology counseling:Discussed with the patient, the details of pathology including the type of breast cancer,the clinical staging, the significance of ER, PR and HER-2/neu receptors and the implications for  treatment. After reviewing the pathology  in detail, we proceeded to discuss the different treatment options between surgery, radiation, chemotherapy, antiestrogen therapies.  Recommendations: 1. Breast conserving surgery followed by 2. Oncotype DX testing to determine if chemotherapy would be of any benefit followed by 3. Adjuvant radiation therapy followed by 4. Adjuvant antiestrogen therapy  Oncotype counseling: I discussed Oncotype DX test. I explained to the patient that this is a 21 gene panel to evaluate patient tumors DNA to calculate recurrence score. This would help determine whether patient has high risk or low risk breast cancer. She understands that if her tumor was found to be high risk, she would benefit from systemic chemotherapy. If low risk, no need of chemotherapy.  Return to clinic after surgery to discuss final pathology report and then determine if Oncotype DX testing will need to be sent.      All questions were answered. The patient knows to call the clinic with any problems, questions or concerns. I personally spent a total of 30 minutes in the care of the patient today including preparing to see the patient, getting/reviewing separately obtained history, performing a medically appropriate exam/evaluation, counseling and educating, placing orders, referring and communicating with other health care professionals, documenting clinical information in the EHR, independently interpreting results, communicating results, and coordinating care.   Viinay K Wylee Dorantes, MD 03/18/24

## 2024-03-18 NOTE — Progress Notes (Signed)
 Met with patient at new patient appt with Dr. Gudena. She stated earlier today everything hit her and she was so overwhelmed by her appointments r/t new cancer diagnosis. After med onc appt today she said she feels so much better now knowing a plan. Reviewed role of navigation with her and she has my contact information. Surgery on 2/10 then will send oncotype. Dr. Gudena stated for navigator to call patient with results of oncotype. Greater or equal to 26 high risk and 25 or below low risk. He does not need to see her until after XRT is complete (unless oncotype states otherwise).

## 2024-03-18 NOTE — Progress Notes (Signed)

## 2024-03-19 ENCOUNTER — Inpatient Hospital Stay: Admission: RE | Admit: 2024-03-19

## 2024-03-19 ENCOUNTER — Ambulatory Visit: Admission: RE | Admit: 2024-03-19 | Source: Ambulatory Visit

## 2024-03-19 ENCOUNTER — Other Ambulatory Visit

## 2024-03-19 DIAGNOSIS — Z170 Estrogen receptor positive status [ER+]: Secondary | ICD-10-CM

## 2024-03-23 ENCOUNTER — Encounter (HOSPITAL_BASED_OUTPATIENT_CLINIC_OR_DEPARTMENT_OTHER): Payer: Self-pay

## 2024-03-23 ENCOUNTER — Encounter

## 2024-03-23 ENCOUNTER — Ambulatory Visit (HOSPITAL_BASED_OUTPATIENT_CLINIC_OR_DEPARTMENT_OTHER): Admission: RE | Admit: 2024-03-23 | Admitting: General Surgery

## 2024-03-23 DIAGNOSIS — Z01818 Encounter for other preprocedural examination: Secondary | ICD-10-CM

## 2024-03-24 ENCOUNTER — Ambulatory Visit: Admitting: Physician Assistant

## 2024-04-13 ENCOUNTER — Ambulatory Visit: Admitting: Physical Therapy

## 2024-04-15 ENCOUNTER — Ambulatory Visit

## 2024-04-20 ENCOUNTER — Ambulatory Visit: Admitting: Physical Therapy

## 2024-04-22 ENCOUNTER — Ambulatory Visit: Admitting: Physical Therapy

## 2024-04-27 ENCOUNTER — Ambulatory Visit: Admitting: Physical Therapy

## 2024-04-29 ENCOUNTER — Ambulatory Visit

## 2024-05-04 ENCOUNTER — Ambulatory Visit: Admitting: Physical Therapy

## 2024-05-06 ENCOUNTER — Ambulatory Visit: Admitting: Physical Therapy
